# Patient Record
Sex: Female | Born: 1965 | Race: White | Hispanic: No | Smoking: Former smoker
Health system: Southern US, Community
[De-identification: ages and names within clinical notes are randomized; demographics above are authoritative.]

## PROBLEM LIST (undated history)

## (undated) DIAGNOSIS — K760 Fatty (change of) liver, not elsewhere classified: Secondary | ICD-10-CM

## (undated) DIAGNOSIS — M5020 Other cervical disc displacement, unspecified cervical region: Secondary | ICD-10-CM

## (undated) DIAGNOSIS — L409 Psoriasis, unspecified: Secondary | ICD-10-CM

## (undated) DIAGNOSIS — R000 Tachycardia, unspecified: Secondary | ICD-10-CM

## (undated) DIAGNOSIS — I10 Essential (primary) hypertension: Secondary | ICD-10-CM

## (undated) DIAGNOSIS — E119 Type 2 diabetes mellitus without complications: Secondary | ICD-10-CM

## (undated) HISTORY — DX: Type 2 diabetes mellitus without complications: E11.9

## (undated) HISTORY — DX: Essential (primary) hypertension: I10

---

## 2006-03-28 ENCOUNTER — Other Ambulatory Visit: Payer: Self-pay

## 2006-03-28 ENCOUNTER — Emergency Department: Payer: Self-pay | Admitting: Emergency Medicine

## 2007-03-23 DIAGNOSIS — F5105 Insomnia due to other mental disorder: Secondary | ICD-10-CM | POA: Insufficient documentation

## 2009-11-28 ENCOUNTER — Ambulatory Visit: Payer: Self-pay

## 2010-07-01 ENCOUNTER — Ambulatory Visit: Payer: Self-pay | Admitting: Otolaryngology

## 2011-02-25 ENCOUNTER — Ambulatory Visit: Payer: Self-pay

## 2012-03-09 ENCOUNTER — Ambulatory Visit: Payer: Self-pay | Admitting: Family Medicine

## 2013-01-16 ENCOUNTER — Ambulatory Visit: Payer: Self-pay | Admitting: Physician Assistant

## 2013-02-14 ENCOUNTER — Ambulatory Visit: Payer: Self-pay | Admitting: Physician Assistant

## 2013-03-15 ENCOUNTER — Ambulatory Visit: Payer: Self-pay

## 2013-04-05 LAB — HM COLONOSCOPY

## 2013-04-18 ENCOUNTER — Ambulatory Visit: Payer: Self-pay | Admitting: Family Medicine

## 2014-03-22 ENCOUNTER — Ambulatory Visit: Payer: Self-pay | Admitting: Family Medicine

## 2014-08-16 HISTORY — PX: INTRAUTERINE DEVICE INSERTION: SHX323

## 2014-11-28 DIAGNOSIS — I10 Essential (primary) hypertension: Secondary | ICD-10-CM | POA: Insufficient documentation

## 2014-11-28 DIAGNOSIS — I471 Supraventricular tachycardia: Secondary | ICD-10-CM | POA: Insufficient documentation

## 2014-11-28 DIAGNOSIS — E119 Type 2 diabetes mellitus without complications: Secondary | ICD-10-CM | POA: Insufficient documentation

## 2014-11-28 DIAGNOSIS — I350 Nonrheumatic aortic (valve) stenosis: Secondary | ICD-10-CM | POA: Insufficient documentation

## 2014-11-28 DIAGNOSIS — E782 Mixed hyperlipidemia: Secondary | ICD-10-CM | POA: Insufficient documentation

## 2015-03-21 ENCOUNTER — Other Ambulatory Visit: Payer: Self-pay

## 2015-03-21 DIAGNOSIS — Z1231 Encounter for screening mammogram for malignant neoplasm of breast: Secondary | ICD-10-CM

## 2015-04-05 ENCOUNTER — Ambulatory Visit
Admission: RE | Admit: 2015-04-05 | Discharge: 2015-04-05 | Disposition: A | Discharge status: Home / Self Care | Payer: Managed Care, Other (non HMO) | Source: Ambulatory Visit | Attending: Family Medicine | Admitting: Family Medicine

## 2015-04-05 DIAGNOSIS — Z1231 Encounter for screening mammogram for malignant neoplasm of breast: Secondary | ICD-10-CM

## 2015-04-05 DIAGNOSIS — R921 Mammographic calcification found on diagnostic imaging of breast: Secondary | ICD-10-CM | POA: Insufficient documentation

## 2015-04-09 ENCOUNTER — Other Ambulatory Visit: Payer: Self-pay | Admitting: Family Medicine

## 2015-04-09 DIAGNOSIS — R928 Other abnormal and inconclusive findings on diagnostic imaging of breast: Secondary | ICD-10-CM

## 2015-04-09 DIAGNOSIS — R921 Mammographic calcification found on diagnostic imaging of breast: Secondary | ICD-10-CM

## 2015-04-10 ENCOUNTER — Ambulatory Visit
Admission: RE | Admit: 2015-04-10 | Discharge: 2015-04-10 | Disposition: A | Discharge status: Home / Self Care | Payer: Managed Care, Other (non HMO) | Source: Ambulatory Visit | Attending: Family Medicine | Admitting: Family Medicine

## 2015-04-10 ENCOUNTER — Ambulatory Visit: Payer: Managed Care, Other (non HMO)

## 2015-04-10 DIAGNOSIS — R928 Other abnormal and inconclusive findings on diagnostic imaging of breast: Secondary | ICD-10-CM

## 2015-04-10 DIAGNOSIS — R921 Mammographic calcification found on diagnostic imaging of breast: Secondary | ICD-10-CM | POA: Diagnosis not present

## 2015-04-12 ENCOUNTER — Other Ambulatory Visit: Payer: Self-pay | Admitting: Family Medicine

## 2015-04-12 DIAGNOSIS — R921 Mammographic calcification found on diagnostic imaging of breast: Secondary | ICD-10-CM

## 2015-04-18 ENCOUNTER — Ambulatory Visit
Admission: RE | Admit: 2015-04-18 | Discharge: 2015-04-18 | Disposition: A | Discharge status: Home / Self Care | Payer: Managed Care, Other (non HMO) | Source: Ambulatory Visit | Attending: Family Medicine | Admitting: Family Medicine

## 2015-04-18 ENCOUNTER — Other Ambulatory Visit: Payer: Self-pay | Admitting: Family Medicine

## 2015-04-18 DIAGNOSIS — R921 Mammographic calcification found on diagnostic imaging of breast: Secondary | ICD-10-CM | POA: Diagnosis present

## 2015-04-18 DIAGNOSIS — R920 Mammographic microcalcification found on diagnostic imaging of breast: Secondary | ICD-10-CM | POA: Diagnosis not present

## 2015-04-18 HISTORY — PX: BREAST BIOPSY: SHX20

## 2015-04-19 LAB — SURGICAL PATHOLOGY

## 2015-05-13 ENCOUNTER — Telehealth: Payer: Self-pay | Admitting: Family Medicine

## 2015-05-13 NOTE — Telephone Encounter (Signed)
Patient advised per [employer] medical records her last Tdap was 01/12/2011. Patient verbalized understanding.

## 2015-05-13 NOTE — Telephone Encounter (Signed)
Pt would like to know when she had her last Tetanus shot. Thanks TNP

## 2015-05-21 ENCOUNTER — Other Ambulatory Visit: Payer: Self-pay

## 2015-05-21 ENCOUNTER — Encounter: Payer: Self-pay | Admitting: Family Medicine

## 2015-05-31 ENCOUNTER — Ambulatory Visit (INDEPENDENT_AMBULATORY_CARE_PROVIDER_SITE_OTHER): Payer: Managed Care, Other (non HMO) | Admitting: Family Medicine

## 2015-05-31 ENCOUNTER — Other Ambulatory Visit: Payer: Self-pay

## 2015-05-31 ENCOUNTER — Encounter: Payer: Self-pay | Admitting: Family Medicine

## 2015-05-31 VITALS — BP 136/86 | HR 89 | Temp 98.2°F | Resp 16 | Ht 61.0 in | Wt 231.4 lb

## 2015-05-31 DIAGNOSIS — Z0000 Encounter for general adult medical examination without abnormal findings: Secondary | ICD-10-CM

## 2015-05-31 DIAGNOSIS — E119 Type 2 diabetes mellitus without complications: Secondary | ICD-10-CM

## 2015-05-31 DIAGNOSIS — I471 Supraventricular tachycardia: Secondary | ICD-10-CM

## 2015-05-31 LAB — POCT URINALYSIS DIPSTICK
Bilirubin, UA: NEGATIVE
Blood, UA: NEGATIVE
GLUCOSE UA: NEGATIVE
Ketones, UA: NEGATIVE
LEUKOCYTES UA: NEGATIVE
Nitrite, UA: NEGATIVE
PH UA: 6
Protein, UA: NEGATIVE
Spec Grav, UA: 1.025
Urobilinogen, UA: 0.2

## 2015-05-31 NOTE — Progress Notes (Signed)
Patient: Kelly Matthews, Female    DOB: Jan 13, 1966, 49 y.o.   MRN: [id number] Visit Date: 06/02/2015  Today's Provider: [name], PA   Chief Complaint  Patient presents with  . Annual Exam   Subjective:    Annual physical exam Kelly Matthews is a 49 y.o. female who presents today for health maintenance and complete physical. She feels fairly well. She reports exercising by walking a lot at work 8 hours a day. She reports she is sleeping well. Had annual diabetes eye exam Feb. 2016 and reports no retinopathy.  Review of Systems  Constitutional: Negative.   HENT: Negative.   Eyes: Negative.   Respiratory: Negative.   Cardiovascular: Negative.   Gastrointestinal: Positive for diarrhea and anal bleeding. Negative for vomiting.       Loose stools once a day 3-4 times a week suspected due to Metformin. Other 3 days a week solid/normal stools. Some blood on tissue with large caliber stool occasional.  Endocrine: Positive for heat intolerance.       Some hot flash and sweats couple times a week for 1 hour.  Genitourinary: Negative.   Musculoskeletal: Negative.   Skin: Negative.   Neurological: Negative.   Hematological: Negative.   Psychiatric/Behavioral: Negative.     Social History She  reports that she has quit smoking. She does not have any smokeless tobacco history on file. She reports that she drinks alcohol. She reports that she does not use illicit drugs. Widowed January 2016 unexpectedly.   Patient Active Problem List   Diagnosis Date Noted  . Diabetes 11/28/2014  . Essential (primary) hypertension 11/28/2014  . Aortic heart valve narrowing 11/28/2014  . 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 Supraventricular tachycardia 11/28/2014    Past Surgical History  Procedure Laterality Date  . Cesarean section  [id number]  . Breast biopsy Left 04/18/2015    Benign calcification  . Intrauterine device insertion   October 2015    [facility]    Family History Her family history includes Cancer in her maternal grandmother; Coronary artery disease in her father and mother; Diabetes in her father; Emphysema in her father; Seizures in her daughter.    No Known Allergies  Previous Medications   BUDESONIDE (PULMICORT FLEXHALER) 180 MCG/ACT INHALER       DILTIAZEM (DILACOR XR) 120 MG 24 HR CAPSULE    1 capsule daily.   FLUTICASONE (FLONASE) 50 MCG/ACT NASAL SPRAY    Place 2 sprays into the nose daily.   HYDROCHLOROTHIAZIDE (MICROZIDE) 12.5 MG CAPSULE    1 capsule daily.   LISINOPRIL (PRINIVIL,ZESTRIL) 5 MG TABLET    Take 5 mg by mouth daily.   METFORMIN (GLUCOPHAGE) 500 MG TABLET    Take 500 mg by mouth 2 (two) times daily.   MONTELUKAST (SINGULAIR) 10 MG TABLET    Take 10 mg by mouth daily.   ONE TOUCH ULTRA TEST TEST STRIP    USE 1 STRIP TWICE DAILY AS NEEDED   ONETOUCH DELICA LANCETS 40J MISC    USE TO CHECK BLOOD SUGAR TWICE A DAY    Patient Care Team: Margo Common, PA as PCP - General (Physician Assistant)      Objective:   Vitals: BP 136/86 mmHg  Pulse 89  Temp(Src) 98.2 F (36.8 C) (Oral)  Resp 16  Ht 5\' 1"  (1.549 m)  Wt 231 lb 6.4 oz (104.962 kg)  BMI 43.75 kg/m2  SpO2 99%   Physical Exam  Constitutional: She is oriented to person, place, and time. She appears well-developed and well-nourished.  Obese with BMI 43.75  HENT:  Head: Normocephalic and atraumatic.  Right Ear: External ear normal.  Left Ear: External ear normal.  Nose: Nose normal.  Mouth/Throat: Oropharynx is clear and moist.  Eyes: Conjunctivae and EOM are normal. Pupils are equal, round, and reactive to light.  Neck: Normal range of motion. Neck supple. No thyromegaly present.  Cardiovascular: Normal rate, regular rhythm, normal heart sounds and intact distal pulses.   Pulmonary/Chest: Effort normal and breath sounds normal.  Abdominal: Soft. Bowel sounds are normal. There is no tenderness.    Genitourinary:  Deferred - has IUD and will follow up with GYN at [facility] (Dr. Glennon Mac).  Musculoskeletal: Normal range of motion.  Neurological: She is alert and oriented to person, place, and time. She has normal reflexes.  Skin: Skin is warm and dry.  Psychiatric: She has a normal mood and affect. Her behavior is normal. Thought content normal.  PHQ-2 score 1/2. No overwhelming  Bereavement  - widowed 12-11-14.     Depression Screen   Assessment & Plan:     Routine Health Maintenance and Physical Exam  Exercise Activities and Dietary recommendations Goals    None      Immunization History  Administered Date(s) Administered  . Hepatitis A 08/31/2012, 03/01/2013  . Hepatitis B 08/31/2012, 11/01/2012, 03/01/2013  . Pneumococcal Polysaccharide-23 05/01/2014  . Tdap 01/12/2011    Health Maintenance  Topic Date Due  . HIV Screening  02/11/1982  . PAP SMEAR  02/11/1985  . INFLUENZA VACCINE  06/17/2015  . HEMOGLOBIN A1C  12/01/2015  . OPHTHALMOLOGY EXAM  12/18/2015  . FOOT EXAM  05/30/2016  . URINE MICROALBUMIN  06/01/2016  . PNEUMOCOCCAL POLYSACCHARIDE VACCINE (2) 05/02/2019  . TETANUS/TDAP  01/12/2021      Discussed health benefits of physical activity, and encouraged her to engage in regular exercise appropriate for her age and condition.    --------------------------------------------------------------------   1. Annual physical exam  Good general health with obesity. Mirena IUD put in October 2015 by her GYN (Dr. Glennon Mac at [facility]) and having minimal menstrual flow - but still regular). Had left breast biopsy 04-18-15 with path report showing a benign calcification. Will get follow up mammograms annually. Counseled regarding colonoscopy in 2 years.  - POCT urinalysis dipstick  2. Type 2 diabetes mellitus without complication Stable with FBS normally 150-190 recently. Tolerating Metformin 500 mg BID with occasional loose stools. Encouraged to continue diabetic  diet. Urine micro albumin 100 today. Continue Lisinopril 5 mg qd. May need statin. Will check labs and follow up pending reports. - CBC with Differential/Platelet - Comprehensive metabolic panel - Hemoglobin A1c - Lipid panel  3. Supraventricular tachycardia Well controlled. Limits caffeine and follows with Dr. Nehemiah Massed (cardiologist) for history of Diltiazem prn recurrences. Check TSH with weight gain and heat intolerance episodes. Recheck prn reports. - TSH

## 2015-06-05 ENCOUNTER — Telehealth: Payer: Self-pay | Admitting: Family Medicine

## 2015-06-05 LAB — COMPREHENSIVE METABOLIC PANEL
ALK PHOS: 94 IU/L (ref 39–117)
ALT: 76 IU/L — ABNORMAL HIGH (ref 0–32)
AST: 44 IU/L — AB (ref 0–40)
Albumin/Globulin Ratio: 1.8 (ref 1.1–2.5)
Albumin: 4.5 g/dL (ref 3.5–5.5)
BILIRUBIN TOTAL: 0.3 mg/dL (ref 0.0–1.2)
BUN/Creatinine Ratio: 17 (ref 9–23)
BUN: 10 mg/dL (ref 6–24)
CO2: 22 mmol/L (ref 18–29)
Calcium: 9.6 mg/dL (ref 8.7–10.2)
Chloride: 96 mmol/L — ABNORMAL LOW (ref 97–108)
Creatinine, Ser: 0.58 mg/dL (ref 0.57–1.00)
GFR calc Af Amer: 126 mL/min/{1.73_m2} (ref >59)
GFR calc non Af Amer: 109 mL/min/{1.73_m2} (ref >59)
GLUCOSE: 187 mg/dL — AB (ref 65–99)
Globulin, Total: 2.5 g/dL (ref 1.5–4.5)
Potassium: 4 mmol/L (ref 3.5–5.2)
Sodium: 138 mmol/L (ref 134–144)
Total Protein: 7 g/dL (ref 6.0–8.5)

## 2015-06-05 LAB — CBC WITH DIFFERENTIAL/PLATELET
BASOS: 0 %
Basophils Absolute: 0 10*3/uL (ref 0.0–0.2)
EOS (ABSOLUTE): 0.1 10*3/uL (ref 0.0–0.4)
Eos: 1 %
Hematocrit: 38.7 % (ref 34.0–46.6)
Hemoglobin: 13.1 g/dL (ref 11.1–15.9)
Immature Grans (Abs): 0 10*3/uL (ref 0.0–0.1)
Immature Granulocytes: 0 %
LYMPHS ABS: 2.1 10*3/uL (ref 0.7–3.1)
LYMPHS: 19 %
MCH: 28.1 pg (ref 26.6–33.0)
MCHC: 33.9 g/dL (ref 31.5–35.7)
MCV: 83 fL (ref 79–97)
MONOCYTES: 5 %
Monocytes Absolute: 0.6 10*3/uL (ref 0.1–0.9)
NEUTROS ABS: 8.2 10*3/uL — AB (ref 1.4–7.0)
NEUTROS PCT: 75 %
Platelets: 187 10*3/uL (ref 150–379)
RBC: 4.67 x10E6/uL (ref 3.77–5.28)
RDW: 13.8 % (ref 12.3–15.4)
WBC: 11 10*3/uL — ABNORMAL HIGH (ref 3.4–10.8)

## 2015-06-05 LAB — LIPID PANEL
CHOLESTEROL TOTAL: 179 mg/dL (ref 100–199)
Chol/HDL Ratio: 3.9 ratio units (ref 0.0–4.4)
HDL: 46 mg/dL (ref >39)
LDL Calculated: 118 mg/dL — ABNORMAL HIGH (ref 0–99)
Triglycerides: 73 mg/dL (ref 0–149)
VLDL Cholesterol Cal: 15 mg/dL (ref 5–40)

## 2015-06-05 LAB — HEMOGLOBIN A1C
ESTIMATED AVERAGE GLUCOSE: 160 mg/dL
HEMOGLOBIN A1C: 7.2 % — AB (ref 4.8–5.6)

## 2015-06-05 LAB — TSH: TSH: 3.04 u[IU]/mL (ref 0.450–4.500)

## 2015-06-05 NOTE — Telephone Encounter (Signed)
Pt is request lab results/MW

## 2015-06-07 NOTE — Telephone Encounter (Signed)
See below

## 2015-06-19 ENCOUNTER — Other Ambulatory Visit: Payer: Self-pay | Admitting: Family Medicine

## 2015-06-20 NOTE — Telephone Encounter (Signed)
Last ov was on 05/31/2015

## 2015-07-19 ENCOUNTER — Ambulatory Visit (INDEPENDENT_AMBULATORY_CARE_PROVIDER_SITE_OTHER): Payer: Managed Care, Other (non HMO) | Admitting: Family Medicine

## 2015-07-19 ENCOUNTER — Encounter: Payer: Self-pay | Admitting: Family Medicine

## 2015-07-19 VITALS — BP 140/86 | HR 62 | Temp 97.8°F | Resp 16 | Wt 228.8 lb

## 2015-07-19 DIAGNOSIS — J4520 Mild intermittent asthma, uncomplicated: Secondary | ICD-10-CM

## 2015-07-19 DIAGNOSIS — E7800 Pure hypercholesterolemia, unspecified: Secondary | ICD-10-CM | POA: Insufficient documentation

## 2015-07-19 DIAGNOSIS — J309 Allergic rhinitis, unspecified: Secondary | ICD-10-CM

## 2015-07-19 DIAGNOSIS — I471 Supraventricular tachycardia: Secondary | ICD-10-CM

## 2015-07-19 DIAGNOSIS — R7303 Prediabetes: Secondary | ICD-10-CM | POA: Insufficient documentation

## 2015-07-19 DIAGNOSIS — I10 Essential (primary) hypertension: Secondary | ICD-10-CM

## 2015-07-19 DIAGNOSIS — N920 Excessive and frequent menstruation with regular cycle: Secondary | ICD-10-CM | POA: Insufficient documentation

## 2015-07-19 DIAGNOSIS — K229 Disease of esophagus, unspecified: Secondary | ICD-10-CM | POA: Insufficient documentation

## 2015-07-19 DIAGNOSIS — J45909 Unspecified asthma, uncomplicated: Secondary | ICD-10-CM | POA: Insufficient documentation

## 2015-07-19 DIAGNOSIS — K760 Fatty (change of) liver, not elsewhere classified: Secondary | ICD-10-CM | POA: Diagnosis not present

## 2015-07-19 DIAGNOSIS — R739 Hyperglycemia, unspecified: Secondary | ICD-10-CM | POA: Insufficient documentation

## 2015-07-19 DIAGNOSIS — E119 Type 2 diabetes mellitus without complications: Secondary | ICD-10-CM | POA: Diagnosis not present

## 2015-07-19 NOTE — Progress Notes (Signed)
Patient ID: Kelly Matthews, female   DOB: 02-26-1966, 49 y.o.   MRN: [id number]   Chief Complaint  Patient presents with  . Follow-up    Subjective:  HPI  This 49 year old widow presents for follow up of diabetes and liver enzyme elevations. Has been trying to follow low fat diabetic diet. Has continued Metformin 500 mg BID. Denies polyuria and no change in thirst level. No blurred vision but some nausea occasion without vomiting. Occasionally has loose stools since starting the Metformin. No change in weight. Also, has had some scratchy sore throat with pressure around nose the past couple days. Some sneezing yesterday evening. No fever or cough. Still taking Flonase, Singulair and Pulmicort for allergies and asthma. Diagnosed with NASH with ALT 166, AST 66 and ultrasound showing fatty liver in 2013. Was having some nausea at that time and was given Zofran for a short time. Has not had any jaundice and no follow up with GI recently.  No Known Allergies  Prior to Admission medications   Medication Sig Start Date End Date Taking? Authorizing Provider  budesonide (PULMICORT FLEXHALER) 180 MCG/ACT inhaler  09/25/14  Yes Historical Provider, MD  diltiazem (DILACOR XR) 120 MG 24 hr capsule 1 capsule daily. 12/03/14  Yes Historical Provider, MD  fluticasone (FLONASE) 50 MCG/ACT nasal spray SPRAY 2 SPRAYS IN EACH NOSTRIL DAILY 06/20/15  Yes Dennis E Chrismon, PA  hydrochlorothiazide (MICROZIDE) 12.5 MG capsule 1 capsule daily. 11/12/14  Yes Historical Provider, MD  lisinopril (PRINIVIL,ZESTRIL) 5 MG tablet Take 5 mg by mouth daily. 05/24/15  Yes Historical Provider, MD  metFORMIN (GLUCOPHAGE) 500 MG tablet Take 500 mg by mouth 2 (two) times daily. 04/28/15  Yes Historical Provider, MD  montelukast (SINGULAIR) 10 MG tablet Take 10 mg by mouth daily. 04/01/15  Yes Historical Provider, MD  ONE TOUCH ULTRA TEST test strip USE 1 STRIP TWICE DAILY AS NEEDED 04/28/15  Yes Historical Provider, MD  Glory Rosebush  DELICA LANCETS 83T MISC USE TO CHECK BLOOD SUGAR TWICE A DAY 04/28/15  Yes Historical Provider, MD    Patient Active Problem List   Diagnosis Date Noted  . Excess, menstruation 07/19/2015  . 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 Blood glucose elevated 07/19/2015  . Airway hyperreactivity 07/19/2015  . Disorder of esophagus 07/19/2015  . Hypercholesterolemia without hypertriglyceridemia 07/19/2015  . Borderline diabetes 07/19/2015  . Diabetes 11/28/2014  . Essential (primary) hypertension 11/28/2014  . Aortic heart valve narrowing 11/28/2014  . 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 Supraventricular tachycardia 11/28/2014  . 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 No pertinent past medical history.  Social History   Social History  . Marital Status: Single    Spouse Name: N/A  . Number of Children: N/A  . Years of Education: N/A   Occupational History  . Not on file.   Social History Main Topics  . Smoking status: Former Smoker -- 1 years  . Smokeless tobacco: Not on file  . Alcohol Use: 0.0 oz/week    0 Standard drinks or equivalent per week     Comment: occasionally  . Drug Use: No  . Sexual Activity: Not on file   Other Topics Concern  . Not on file   Social History Narrative    No Known Allergies  Review of Systems  Constitutional: Negative.   HENT: Negative.   Eyes: Negative.   Respiratory: Negative.   Cardiovascular: Negative.   Gastrointestinal: Positive for nausea.  Genitourinary: Negative.   Musculoskeletal: Negative.   Skin: Negative.   Neurological: Negative.   Endo/Heme/Allergies: Negative.   Psychiatric/Behavioral: Negative.     Immunization History  Administered Date(s) Administered  . Hepatitis A 08/31/2012, 03/01/2013  . Hepatitis B 08/31/2012, 11/01/2012, 03/01/2013  . Pneumococcal Polysaccharide-23 05/01/2014  . Tdap 01/12/2011   Objective:  BP 140/86 mmHg  Pulse 62   Temp(Src) 97.8 F (36.6 C) (Oral)  Resp 16  Wt 228 lb 12.8 oz (103.783 kg)  Physical Exam  Constitutional: She is oriented to person, place, and time and well-developed, well-nourished, and in no distress.  HENT:  Head: Normocephalic and atraumatic.  Nose: Nose normal.  Mouth/Throat: Oropharynx is clear and moist.  Slight cobblestoning of posterior pharynx  Eyes: EOM are normal.  Neck: Normal range of motion.  Cardiovascular: Normal rate, regular rhythm and normal heart sounds.   Pulmonary/Chest: Effort normal and breath sounds normal.  Abdominal: Soft. Bowel sounds are normal. She exhibits no mass. There is no tenderness.  Musculoskeletal: Normal range of motion.  Neurological: She is alert and oriented to person, place, and time.  Skin: No rash noted.  Psychiatric: Affect and judgment normal.    Lab Results  Component Value Date   WBC 11.0* 06/04/2015   HCT 38.7 06/04/2015   GLUCOSE 187* 06/04/2015   CHOL 179 06/04/2015   TRIG 73 06/04/2015   HDL 46 06/04/2015   LDLCALC 118* 06/04/2015   TSH 3.040 06/04/2015   HGBA1C 7.2* 06/04/2015    CMP     Component Value Date/Time   NA 138 06/04/2015 0813   K 4.0 06/04/2015 0813   CL 96* 06/04/2015 0813   CO2 22 06/04/2015 0813   GLUCOSE 187* 06/04/2015 0813   BUN 10 06/04/2015 0813   CREATININE 0.58 06/04/2015 0813   CALCIUM 9.6 06/04/2015 0813   PROT 7.0 06/04/2015 0813   AST 44* 06/04/2015 0813   ALT 76* 06/04/2015 0813   ALKPHOS 94 06/04/2015 0813   BILITOT 0.3 06/04/2015 0813   GFRNONAA 109 06/04/2015 0813   GFRAA 126 06/04/2015 0813    Assessment and Plan :  1. Type 2 diabetes mellitus without complication Weight still up with little change in BMI. Still taking Metformin 500 mg BID. Occasional nausea and loose stool. May need change to DPP4 or SGLT. Hgb A1C was 7.2 in July 2016. Recheck CMP. - COMPLETE METABOLIC PANEL WITH GFR  2. Hypercholesterolemia without hypertriglyceridemia Has been trying to watch diet  closer. Recheck lipid panel to see if statin needed. - Lipid panel  3. Fatty metamorphosis of liver Fatty liver shown on abdominal ultrasound in 2013. GI referral indicated NASH with ALT 166 and AST 66. Will recheck progress and consider follow up with GI for repeat ultrasound since she has had some unexplained nausea symptoms recently. - COMPLETE METABOLIC PANEL WITH GFR  4. Airway hyperreactivity, mild intermittent, uncomplicated Stable and controlled with Singulair and Pulmicort. Recheck prn.  5. Essential (primary) hypertension BP borderline today. Continues Lisinopril, HCTZ and follow up with cardiologist. May need Diltiazem daily to control SVT and help BP control.  - CBC with Differential/Platelet  6. Supraventricular tachycardia Stable and controlled by use of Diltiazem as recommended by cardiologist. Will continue follow up with Dr. Nehemiah Massed.  7. Allergic rhinitis, unspecified allergic rhinitis type Recent scratchy throat and nasal congestion. Continue flonase and recommend loratadine. Recheck prn.   Miguel Aschoff MD [facility] 07/19/2015 8:16 AM

## 2015-07-20 LAB — CBC WITH DIFFERENTIAL/PLATELET
BASOS: 0 %
Basophils Absolute: 0 10*3/uL (ref 0.0–0.2)
EOS (ABSOLUTE): 0 10*3/uL (ref 0.0–0.4)
Eos: 0 %
Hematocrit: 40.6 % (ref 34.0–46.6)
Hemoglobin: 13.6 g/dL (ref 11.1–15.9)
IMMATURE GRANS (ABS): 0 10*3/uL (ref 0.0–0.1)
IMMATURE GRANULOCYTES: 0 %
Lymphocytes Absolute: 2.2 10*3/uL (ref 0.7–3.1)
Lymphs: 20 %
MCH: 28.2 pg (ref 26.6–33.0)
MCHC: 33.5 g/dL (ref 31.5–35.7)
MCV: 84 fL (ref 79–97)
MONOCYTES: 3 %
Monocytes Absolute: 0.4 10*3/uL (ref 0.1–0.9)
NEUTROS ABS: 8.4 10*3/uL — AB (ref 1.4–7.0)
Neutrophils: 77 %
Platelets: 159 10*3/uL (ref 150–379)
RBC: 4.82 x10E6/uL (ref 3.77–5.28)
RDW: 13.5 % (ref 12.3–15.4)
WBC: 11.1 10*3/uL — ABNORMAL HIGH (ref 3.4–10.8)

## 2015-07-20 LAB — COMPREHENSIVE METABOLIC PANEL
ALBUMIN: 4.6 g/dL (ref 3.5–5.5)
ALK PHOS: 110 IU/L (ref 39–117)
ALT: 89 IU/L — ABNORMAL HIGH (ref 0–32)
AST: 45 IU/L — ABNORMAL HIGH (ref 0–40)
Albumin/Globulin Ratio: 1.7 (ref 1.1–2.5)
BUN / CREAT RATIO: 26 — AB (ref 9–23)
BUN: 15 mg/dL (ref 6–24)
Bilirubin Total: 0.4 mg/dL (ref 0.0–1.2)
CHLORIDE: 96 mmol/L — AB (ref 97–108)
CO2: 22 mmol/L (ref 18–29)
Calcium: 9.6 mg/dL (ref 8.7–10.2)
Creatinine, Ser: 0.58 mg/dL (ref 0.57–1.00)
GFR calc Af Amer: 126 mL/min/{1.73_m2} (ref >59)
GFR calc non Af Amer: 109 mL/min/{1.73_m2} (ref >59)
GLOBULIN, TOTAL: 2.7 g/dL (ref 1.5–4.5)
GLUCOSE: 215 mg/dL — AB (ref 65–99)
Potassium: 4.2 mmol/L (ref 3.5–5.2)
SODIUM: 137 mmol/L (ref 134–144)
Total Protein: 7.3 g/dL (ref 6.0–8.5)

## 2015-07-20 LAB — LIPID PANEL
CHOLESTEROL TOTAL: 185 mg/dL (ref 100–199)
Chol/HDL Ratio: 4.2 ratio units (ref 0.0–4.4)
HDL: 44 mg/dL (ref >39)
LDL CALC: 127 mg/dL — AB (ref 0–99)
Triglycerides: 69 mg/dL (ref 0–149)
VLDL CHOLESTEROL CAL: 14 mg/dL (ref 5–40)

## 2015-07-22 ENCOUNTER — Other Ambulatory Visit: Payer: Self-pay | Admitting: Family Medicine

## 2015-07-22 DIAGNOSIS — J309 Allergic rhinitis, unspecified: Secondary | ICD-10-CM

## 2015-07-23 ENCOUNTER — Telehealth: Payer: Self-pay

## 2015-07-23 DIAGNOSIS — J309 Allergic rhinitis, unspecified: Secondary | ICD-10-CM | POA: Insufficient documentation

## 2015-07-23 DIAGNOSIS — R748 Abnormal levels of other serum enzymes: Secondary | ICD-10-CM

## 2015-07-23 NOTE — Telephone Encounter (Signed)
Patient advised as directed below. Per patient needs a referral to a gastroenterologist because the one the patient used to see is not practicing. Patient also wants a glucose meter, per patient got one from [facility] but it broke. Also how many sampls of Invokana do  I prepare for the patient to come and pick up> Please advise.  Thanks,  -Yosselin Zoeller

## 2015-07-23 NOTE — Telephone Encounter (Signed)
-----   Message from Margo Common, [place] sent at 07/22/2015  4:00 PM EDT ----- Slight increase in ALT but AST stable. Should consider follow up with gastroenterologist if nausea continues. Blood sugar higher and LDL cholesterol slightly higher than a month ago. Recommend lowering Metformin to 500 mg qd and adding Invokana (have some samples to give it a trial). Continue to check FBS daily and work on weight loss. Recheck progress in 4 weeks (this change could lower BP a little more).

## 2015-07-23 NOTE — Telephone Encounter (Signed)
Schedule gastroenterology referral for elevated liver enzymes and history of NASH. May give her a OneTouch Mini or Verio glucometer we have here to check FBS each morning. Give 6 bottles (30 tablets) of the Invokana 100 mg to take one tablet daily and plan recheck appointment in 4 weeks.

## 2015-07-24 ENCOUNTER — Other Ambulatory Visit: Payer: Self-pay | Admitting: Family Medicine

## 2015-07-24 DIAGNOSIS — J309 Allergic rhinitis, unspecified: Secondary | ICD-10-CM

## 2015-07-24 NOTE — Telephone Encounter (Signed)
Patient aware of the referral and already came to pick up the Invokana samples and the One Touch Mini and scheduled the 4 weeks follow up.  Thanks,  -[name]

## 2015-07-26 ENCOUNTER — Other Ambulatory Visit: Payer: Self-pay

## 2015-07-26 ENCOUNTER — Telehealth: Payer: Self-pay

## 2015-07-26 DIAGNOSIS — J309 Allergic rhinitis, unspecified: Secondary | ICD-10-CM

## 2015-07-26 DIAGNOSIS — J4520 Mild intermittent asthma, uncomplicated: Secondary | ICD-10-CM

## 2015-07-26 DIAGNOSIS — E119 Type 2 diabetes mellitus without complications: Secondary | ICD-10-CM

## 2015-07-26 DIAGNOSIS — I10 Essential (primary) hypertension: Secondary | ICD-10-CM

## 2015-07-26 MED ORDER — FLUTICASONE PROPIONATE 50 MCG/ACT NA SUSP: NASAL | Status: DC

## 2015-07-26 MED ORDER — LISINOPRIL 5 MG PO TABS: 5.0000 mg | ORAL_TABLET | Freq: Every day | ORAL | Status: DC

## 2015-07-26 MED ORDER — METFORMIN HCL 500 MG PO TABS: 500.0000 mg | ORAL_TABLET | Freq: Two times a day (BID) | ORAL | Status: DC

## 2015-07-26 MED ORDER — MONTELUKAST SODIUM 10 MG PO TABS: 10.0000 mg | ORAL_TABLET | Freq: Every day | ORAL | Status: DC

## 2015-07-26 NOTE — Telephone Encounter (Signed)
Last ov was on 07/19/2015.  Thanks,

## 2015-07-26 NOTE — Telephone Encounter (Signed)
Request received from [facility] pharmacy requesting a 90 day supply on Fluticasone Prop 50 mcg spray Metformin HCL 500 mg, Lisinopril 5 mg, and Montelukast 10 mg.

## 2015-07-29 ENCOUNTER — Other Ambulatory Visit: Payer: Self-pay

## 2015-07-29 DIAGNOSIS — I10 Essential (primary) hypertension: Secondary | ICD-10-CM

## 2015-07-29 MED ORDER — LISINOPRIL 5 MG PO TABS: 5.0000 mg | ORAL_TABLET | Freq: Every day | ORAL | Status: DC

## 2015-07-30 NOTE — Telephone Encounter (Signed)
Request faxed back to [facility] pharmacy with approval for 90 day supply per [facility].

## 2015-08-06 ENCOUNTER — Encounter: Payer: Self-pay | Admitting: Family Medicine

## 2015-08-06 ENCOUNTER — Ambulatory Visit (INDEPENDENT_AMBULATORY_CARE_PROVIDER_SITE_OTHER): Payer: Managed Care, Other (non HMO) | Admitting: Family Medicine

## 2015-08-06 VITALS — BP 122/84 | HR 92 | Temp 98.5°F | Resp 18 | Wt 226.8 lb

## 2015-08-06 DIAGNOSIS — E119 Type 2 diabetes mellitus without complications: Secondary | ICD-10-CM | POA: Diagnosis not present

## 2015-08-06 DIAGNOSIS — M549 Dorsalgia, unspecified: Secondary | ICD-10-CM

## 2015-08-06 DIAGNOSIS — M546 Pain in thoracic spine: Secondary | ICD-10-CM

## 2015-08-06 LAB — POCT GLYCOSYLATED HEMOGLOBIN (HGB A1C): HEMOGLOBIN A1C: 8.2

## 2015-08-06 MED ORDER — METHOCARBAMOL 500 MG PO TABS: 500.0000 mg | ORAL_TABLET | Freq: Four times a day (QID) | ORAL | Status: DC

## 2015-08-06 NOTE — Patient Instructions (Signed)
Back Pain, Adult °Back pain is very common. The pain often gets better over time. The cause of back pain is usually not dangerous. Most people can learn to manage their back pain on their own.  °HOME CARE  °· Stay active. Start with short walks on flat ground if you can. Try to walk farther each day. °· Do not sit, drive, or stand in one place for more than 30 minutes. Do not stay in bed. °· Do not avoid exercise or work. Activity can help your back heal faster. °· Be careful when you bend or lift an object. Bend at your knees, keep the object close to you, and do not twist. °· Sleep on a firm mattress. Lie on your side, and bend your knees. If you lie on your back, put a pillow under your knees. °· Only take medicines as told by your doctor. °· Put ice on the injured area. °¨ Put ice in a plastic bag. °¨ Place a towel between your skin and the bag. °¨ Leave the ice on for 15-20 minutes, 03-04 times a day for the first 2 to 3 days. After that, you can switch between ice and heat packs. °· Ask your doctor about back exercises or massage. °· Avoid feeling anxious or stressed. Find good ways to deal with stress, such as exercise. °GET HELP RIGHT AWAY IF:  °· Your pain does not go away with rest or medicine. °· Your pain does not go away in 1 week. °· You have new problems. °· You do not feel well. °· The pain spreads into your legs. °· You cannot control when you poop (bowel movement) or pee (urinate). °· Your arms or legs feel weak or lose feeling (numbness). °· You feel sick to your stomach (nauseous) or throw up (vomit). °· You have belly (abdominal) pain. °· You feel like you may pass out (faint). °MAKE SURE YOU:  °· Understand these instructions. °· Will watch your condition. °· Will get help right away if you are not doing well or get worse. °Document Released: 04/20/2008 Document Revised: 01/25/2012 Document Reviewed: 03/06/2014 °[employer]® Patient Information ©2015 [employer]. This information is not intended  to replace advice given to you by your health care [name]. Make sure you discuss any questions you have with your health care [name]. ° °

## 2015-08-06 NOTE — Progress Notes (Signed)
Patient ID: Kelly Matthews, female   DOB: [birth year], 49 y.o.   MRN: [id number] Name: Kelly Matthews   MRN: [id number]    DOB: 1966-04-18   Date:08/06/2015       Progress Note  Subjective  Chief Complaint  Chief Complaint  Patient presents with  . Back Pain    right mid back pain X 10 days   Back Pain This is a new problem. The current episode started 1 to 4 weeks ago. The problem occurs constantly. The problem has been gradually worsening since onset.   Patient Active Problem List   Diagnosis Date Noted  . Allergic rhinitis 07/23/2015  . Excess, menstruation 07/19/2015  . 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 Blood glucose elevated 07/19/2015  . Airway hyperreactivity 07/19/2015  . Disorder of esophagus 07/19/2015  . Hypercholesterolemia without hypertriglyceridemia 07/19/2015  . Borderline diabetes 07/19/2015  . Diabetes 11/28/2014  . Essential (primary) hypertension 11/28/2014  . Aortic heart valve narrowing 11/28/2014  . 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 Supraventricular tachycardia 11/28/2014  . 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 Smoking status: Former Smoker -- 1 years  . Smokeless tobacco: Not on file  . Alcohol Use: 0.0 oz/week    0 Standard drinks or equivalent per week     Comment: occasionally    Current outpatient prescriptions:  .  budesonide (PULMICORT FLEXHALER) 180 MCG/ACT inhaler, , Disp: , Rfl:  .  canagliflozin (INVOKANA) 100 MG TABS tablet, Take 100 mg by mouth daily., Disp: , Rfl:  .  diltiazem (DILACOR XR) 120 MG 24 hr capsule, 1 capsule daily., Disp: , Rfl:  .  fluticasone (FLONASE) 50 MCG/ACT nasal spray, SPRAY 2 SPRAYS IN EACH NOSTRIL DAILY, Disp: 16 g, Rfl: 5 .  hydrochlorothiazide (MICROZIDE) 12.5 MG capsule, 1 capsule daily., Disp: , Rfl:  .  lisinopril (PRINIVIL,ZESTRIL) 5 MG tablet, Take 1 tablet (5 mg total) by mouth daily., Disp: 90  tablet, Rfl: 3 .  metFORMIN (GLUCOPHAGE) 500 MG tablet, Take 1 tablet (500 mg total) by mouth 2 (two) times daily., Disp: 60 tablet, Rfl: 3 .  montelukast (SINGULAIR) 10 MG tablet, Take 1 tablet (10 mg total) by mouth daily., Disp: 30 tablet, Rfl: 5 .  ONE TOUCH ULTRA TEST test strip, USE 1 STRIP TWICE DAILY AS NEEDED, Disp: , Rfl: 12 .  ONETOUCH DELICA LANCETS 59D MISC, USE TO CHECK BLOOD SUGAR TWICE A DAY, Disp: , Rfl: 12  No Known Allergies  Review of Systems  Constitutional: Negative.   HENT: Negative.   Eyes: Negative.   Respiratory: Negative.   Cardiovascular: Negative.   Gastrointestinal: Negative.   Genitourinary: Negative.   Musculoskeletal: Positive for back pain.  Skin: Negative.   Neurological: Negative.   Endo/Heme/Allergies: Negative.   Psychiatric/Behavioral: Negative.    Objective  Filed Vitals:   08/06/15 0936  BP: 122/84  Pulse: 92  Temp: 98.5 F (36.9 C)  TempSrc: Oral  Resp: 18  Weight: 226 lb 12.8 oz (102.876 kg)  SpO2: 97%   Physical Exam  Constitutional: She is oriented to person, place, and time and well-developed, well-nourished, and in no distress.  HENT:  Head: Normocephalic and atraumatic.  Eyes: Conjunctivae are normal.  Cardiovascular: Normal rate and regular rhythm.   Pulmonary/Chest: Effort normal and breath sounds normal.  Abdominal: Soft. Bowel sounds are normal.  Musculoskeletal: Normal range of motion. She exhibits tenderness.  Tender over medial border muscles  of the right scapula. Increased discomfort to elevated right humerus against resistance. DTR's symmetric with good grip strength.  Neurological: She is alert and oriented to person, place, and time. She has normal reflexes.  Skin: No rash noted.  Psychiatric: Affect normal.   Recent Results (from the past 2160 hour(s))  POCT urinalysis dipstick     Status: None   Collection Time: 05/31/15  9:19 AM  Result Value Ref Range   Color, UA yellow    Clarity, UA clear     Glucose, UA neg    Bilirubin, UA neg    Ketones, UA neg    Spec Grav, UA 1.025    Blood, UA neg    pH, UA 6.0    Protein, UA neg    Urobilinogen, UA 0.2    Nitrite, UA neg    Leukocytes, UA Negative Negative  CBC with Differential/Platelet     Status: Abnormal   Collection Time: 06/04/15  8:13 AM  Result Value Ref Range   WBC 11.0 (H) 3.4 - 10.8 x10E3/uL   RBC 4.67 3.77 - 5.28 x10E6/uL   Hemoglobin 13.1 11.1 - 15.9 g/dL   Hematocrit 38.7 34.0 - 46.6 %   MCV 83 79 - 97 fL   MCH 28.1 26.6 - 33.0 pg   MCHC 33.9 31.5 - 35.7 g/dL   RDW 13.8 12.3 - 15.4 %   Platelets 187 150 - 379 x10E3/uL   Neutrophils 75 %   Lymphs 19 %   Monocytes 5 %   Eos 1 %   Basos 0 %   Neutrophils Absolute 8.2 (H) 1.4 - 7.0 x10E3/uL   Lymphocytes Absolute 2.1 0.7 - 3.1 x10E3/uL   Monocytes Absolute 0.6 0.1 - 0.9 x10E3/uL   EOS (ABSOLUTE) 0.1 0.0 - 0.4 x10E3/uL   Basophils Absolute 0.0 0.0 - 0.2 x10E3/uL   Immature Granulocytes 0 %   Immature Grans (Abs) 0.0 0.0 - 0.1 x10E3/uL  Comprehensive metabolic panel     Status: Abnormal   Collection Time: 06/04/15  8:13 AM  Result Value Ref Range   Glucose 187 (H) 65 - 99 mg/dL   BUN 10 6 - 24 mg/dL   Creatinine, Ser 0.58 0.57 - 1.00 mg/dL   GFR calc non Af Amer 109 >59 mL/min/1.73   GFR calc Af Amer 126 >59 mL/min/1.73   BUN/Creatinine Ratio 17 9 - 23   Sodium 138 134 - 144 mmol/L   Potassium 4.0 3.5 - 5.2 mmol/L   Chloride 96 (L) 97 - 108 mmol/L   CO2 22 18 - 29 mmol/L   Calcium 9.6 8.7 - 10.2 mg/dL   Total Protein 7.0 6.0 - 8.5 g/dL   Albumin 4.5 3.5 - 5.5 g/dL   Globulin, Total 2.5 1.5 - 4.5 g/dL   Albumin/Globulin Ratio 1.8 1.1 - 2.5   Bilirubin Total 0.3 0.0 - 1.2 mg/dL   Alkaline Phosphatase 94 39 - 117 IU/L   AST 44 (H) 0 - 40 IU/L   ALT 76 (H) 0 - 32 IU/L  Hemoglobin A1c     Status: Abnormal   Collection Time: 06/04/15  8:13 AM  Result Value Ref Range   Hgb A1c MFr Bld 7.2 (H) 4.8 - 5.6 %    Comment:          Pre-diabetes: 5.7 - 6.4           Diabetes: >6.4          Glycemic control for adults with diabetes: <7.0  Est. average glucose Bld gHb Est-mCnc 160 mg/dL  Lipid panel     Status: Abnormal   Collection Time: 06/04/15  8:13 AM  Result Value Ref Range   Cholesterol, Total 179 100 - 199 mg/dL   Triglycerides 73 0 - 149 mg/dL   HDL 46 >39 mg/dL    Comment: According to ATP-III Guidelines, HDL-C >59 mg/dL is considered a negative risk factor for CHD.    VLDL Cholesterol Cal 15 5 - 40 mg/dL   LDL Calculated 118 (H) 0 - 99 mg/dL   Chol/HDL Ratio 3.9 0.0 - 4.4 ratio units    Comment:                                   T. Chol/HDL Ratio                                             Men  Women                               1/2 Avg.Risk  3.4    3.3                                   Avg.Risk  5.0    4.4                                2X Avg.Risk  9.6    7.1                                3X Avg.Risk 23.4   11.0   TSH     Status: None   Collection Time: 06/04/15  8:13 AM  Result Value Ref Range   TSH 3.040 0.450 - 4.500 uIU/mL  CBC with Differential/Platelet     Status: Abnormal   Collection Time: 07/19/15  9:33 AM  Result Value Ref Range   WBC 11.1 (H) 3.4 - 10.8 x10E3/uL   RBC 4.82 3.77 - 5.28 x10E6/uL   Hemoglobin 13.6 11.1 - 15.9 g/dL   Hematocrit 40.6 34.0 - 46.6 %   MCV 84 79 - 97 fL   MCH 28.2 26.6 - 33.0 pg   MCHC 33.5 31.5 - 35.7 g/dL   RDW 13.5 12.3 - 15.4 %   Platelets 159 150 - 379 x10E3/uL   Neutrophils 77 %   Lymphs 20 %   Monocytes 3 %   Eos 0 %   Basos 0 %   Neutrophils Absolute 8.4 (H) 1.4 - 7.0 x10E3/uL   Lymphocytes Absolute 2.2 0.7 - 3.1 x10E3/uL   Monocytes Absolute 0.4 0.1 - 0.9 x10E3/uL   EOS (ABSOLUTE) 0.0 0.0 - 0.4 x10E3/uL   Basophils Absolute 0.0 0.0 - 0.2 x10E3/uL   Immature Granulocytes 0 %   Immature Grans (Abs) 0.0 0.0 - 0.1 x10E3/uL  Lipid panel     Status: Abnormal   Collection Time: 07/19/15  9:33 AM  Result Value Ref Range   Cholesterol, Total 185 100 - 199 mg/dL    Triglycerides 69 0 - 149 mg/dL  HDL 44 >39 mg/dL    Comment: According to ATP-III Guidelines, HDL-C >59 mg/dL is considered a negative risk factor for CHD.    VLDL Cholesterol Cal 14 5 - 40 mg/dL   LDL Calculated 127 (H) 0 - 99 mg/dL   Chol/HDL Ratio 4.2 0.0 - 4.4 ratio units    Comment:                                   T. Chol/HDL Ratio                                             Men  Women                               1/2 Avg.Risk  3.4    3.3                                   Avg.Risk  5.0    4.4                                2X Avg.Risk  9.6    7.1                                3X Avg.Risk 23.4   11.0   Comprehensive metabolic panel     Status: Abnormal   Collection Time: 07/19/15  9:33 AM  Result Value Ref Range   Glucose 215 (H) 65 - 99 mg/dL   BUN 15 6 - 24 mg/dL   Creatinine, Ser 0.58 0.57 - 1.00 mg/dL   GFR calc non Af Amer 109 >59 mL/min/1.73   GFR calc Af Amer 126 >59 mL/min/1.73   BUN/Creatinine Ratio 26 (H) 9 - 23   Sodium 137 134 - 144 mmol/L   Potassium 4.2 3.5 - 5.2 mmol/L   Chloride 96 (L) 97 - 108 mmol/L   CO2 22 18 - 29 mmol/L   Calcium 9.6 8.7 - 10.2 mg/dL   Total Protein 7.3 6.0 - 8.5 g/dL   Albumin 4.6 3.5 - 5.5 g/dL   Globulin, Total 2.7 1.5 - 4.5 g/dL   Albumin/Globulin Ratio 1.7 1.1 - 2.5   Bilirubin Total 0.4 0.0 - 1.2 mg/dL   Alkaline Phosphatase 110 39 - 117 IU/L   AST 45 (H) 0 - 40 IU/L   ALT 89 (H) 0 - 32 IU/L   Assessment & Plan  1. Upper back pain on right side Onset over the past 1 1/2 weeks. No known injury and no rash. Denies cough, congestion or fevers. Increased discomfort to use upper arm. Feels better with pressure applied to the right scapula. Recommend moist heat applications, use Aleve BID and add Methocarbamol 500 mg QID. Recheck prn.  2. Type 2 diabetes mellitus without complication  Blood sugars at home have been in the 200 - 300 range and noticing increase in thirst. Still tolerating Invokana 100 mg qd with Metformin 500 mg  qd. Recommend increasing Metformin to 500 mg BID and continue same dose of Invokana. Recheck in  3 months. Hgb A1C 8.2 today (was 7.2 June 04, 2015) - POCT glycosylated hemoglobin (Hb A1C)

## 2015-08-22 ENCOUNTER — Ambulatory Visit: Payer: Managed Care, Other (non HMO) | Admitting: Family Medicine

## 2015-08-28 ENCOUNTER — Encounter: Payer: Self-pay | Admitting: Gastroenterology

## 2015-08-28 ENCOUNTER — Other Ambulatory Visit: Payer: Self-pay

## 2015-08-28 ENCOUNTER — Ambulatory Visit (INDEPENDENT_AMBULATORY_CARE_PROVIDER_SITE_OTHER): Payer: Managed Care, Other (non HMO) | Admitting: Gastroenterology

## 2015-08-28 VITALS — BP 134/70 | HR 95 | Temp 98.0°F | Ht 59.0 in | Wt 223.0 lb

## 2015-08-28 DIAGNOSIS — R748 Abnormal levels of other serum enzymes: Secondary | ICD-10-CM

## 2015-08-28 MED ORDER — ONDANSETRON HCL 4 MG PO TABS: 4.0000 mg | ORAL_TABLET | Freq: Four times a day (QID) | ORAL | Status: DC | PRN

## 2015-08-28 NOTE — Progress Notes (Signed)
Gastroenterology Consultation  Referring Provider:     Margo Common, PA Primary Care Physician:  [name], PA Primary Gastroenterologist:  Dr. Allen Norris     Reason for Consultation:     Abnormal liver enzymes        HPI:   Kelly Matthews is a 49 y.o. y/o female referred for consultation & management of abnormal liver enzymes by Dr. [name], PA.  Patient comes today with a history of abnormal liver enzymes. The patient states she was seen in the past by a gastrologist at [facility]. The patient reports that at that time she was given some antinausea medication because she suffers from intermittent nausea and a few times a month. The patient was also set up for an EGD and colonoscopy at that time but nothing she recalls was done for her liver except for an ultrasound. The patient states that she had fatty liver found on the ultrasound. There is no report of any active stools or bloody stools. She also denies any fevers or chills. There is no abdominal pain associated with her abnormal liver enzymes. The patient also has diabetes and is being treated for that.  Past Medical History  Diagnosis Date  . Diabetes mellitus without complication ([place])   . Hypertension     Past Surgical History  Procedure Laterality Date  . Cesarean section  [id number]  . Breast biopsy Left 04/18/2015    Benign calcification  . Intrauterine device insertion  October 2015    [facility]    Prior to Admission medications   Medication Sig Start Date End Date Taking? Authorizing Provider  budesonide (PULMICORT FLEXHALER) 180 MCG/ACT inhaler  09/25/14  Yes Historical Provider, MD  canagliflozin (INVOKANA) 100 MG TABS tablet Take 100 mg by mouth daily.   Yes Historical Provider, MD  diltiazem (DILACOR XR) 120 MG 24 hr capsule 1 capsule daily. 12/03/14  Yes Historical Provider, MD  fluticasone (FLONASE) 50 MCG/ACT nasal spray SPRAY 2 SPRAYS IN EACH NOSTRIL DAILY 07/26/15  Yes Dennis E  Chrismon, PA  hydrochlorothiazide (MICROZIDE) 12.5 MG capsule 1 capsule daily. 11/12/14  Yes Historical Provider, MD  lisinopril (PRINIVIL,ZESTRIL) 5 MG tablet Take 1 tablet (5 mg total) by mouth daily. 07/29/15  Yes Dennis E Chrismon, PA  metFORMIN (GLUCOPHAGE) 500 MG tablet Take 1 tablet (500 mg total) by mouth 2 (two) times daily. 07/26/15  Yes Dennis E Chrismon, PA  methocarbamol (ROBAXIN) 500 MG tablet Take 1 tablet (500 mg total) by mouth 4 (four) times daily. 08/06/15  Yes Dennis E Chrismon, PA  montelukast (SINGULAIR) 10 MG tablet Take 1 tablet (10 mg total) by mouth daily. 07/26/15  Yes Dennis E Chrismon, PA  ONE TOUCH ULTRA TEST test strip USE 1 STRIP TWICE DAILY AS NEEDED 04/28/15  Yes Historical Provider, MD  ONETOUCH DELICA LANCETS 82N MISC USE TO CHECK BLOOD SUGAR TWICE A DAY 04/28/15  Yes Historical Provider, MD  ondansetron (ZOFRAN) 4 MG tablet Take 1 tablet (4 mg total) by mouth every 6 (six) hours as needed for nausea or vomiting. 08/28/15   Lucilla Lame, MD    Family History  Problem Relation Age of Onset  . Coronary artery disease Mother   . Coronary artery disease Father   . Diabetes Father   . Emphysema Father   . Seizures Daughter   . Cancer Maternal Grandmother      Social History  Substance Use Topics  . Smoking status: Former Smoker -- 1 years  .  Smokeless tobacco: Never Used  . Alcohol Use: 0.0 oz/week    0 Standard drinks or equivalent per week     Comment: occasionally    Allergies as of 08/28/2015  . (No Known Allergies)    Review of Systems:    All systems reviewed and negative except where noted in HPI.   Physical Exam:  BP 134/70 mmHg  Pulse 95  Temp(Src) 98 F (36.7 C) (Oral)  Ht 4\' 11"  (1.499 m)  Wt 223 lb (101.152 kg)  BMI 45.02 kg/m2 No LMP recorded. Patient is not currently having periods (Reason: IUD). Psych:  Alert and cooperative. Normal mood and affect. General:   Alert,  Well-developed, obese, well-nourished, pleasant and cooperative in  NAD Head:  Normocephalic and atraumatic. Eyes:  Sclera clear, no icterus.   Conjunctiva pink. Ears:  Normal auditory acuity. Nose:  No deformity, discharge, or lesions. Mouth:  No deformity or lesions,oropharynx pink & moist. Neck:  Supple; no masses or thyromegaly. Lungs:  Respirations even and unlabored.  Clear throughout to auscultation.   No wheezes, crackles, or rhonchi. No acute distress. Heart:  Regular rate and rhythm; no murmurs, clicks, rubs, or gallops. Abdomen:  Normal bowel sounds.  No bruits.  Soft, non-tender and non-distended without masses, hepatosplenomegaly or hernias noted.  No guarding or rebound tenderness.  Negative Carnett sign.   Rectal:  Deferred.  Msk:  Symmetrical without gross deformities.  Good, equal movement & strength bilaterally. Pulses:  Normal pulses noted. Extremities:  No clubbing or edema.  No cyanosis. Neurologic:  Alert and oriented x3;  grossly normal neurologically. Skin:  Intact without significant lesions or rashes.  No jaundice. Lymph Nodes:  No significant cervical adenopathy. Psych:  Alert and cooperative. Normal mood and affect.  Imaging Studies: No results found.  Assessment and Plan:   Kelly Matthews is a 49 y.o. y/o female who comes in today with a history of abnormal liver enzymes. The patient's liver enzymes have been persistently elevated. The patient is obese and has been told the importance of losing weight for fatty liver. The patient will also have labs sent off for other possible causes of liver enzyme elevation. The patient will also have her blood checked for immunity to hepatitis A and B to determine whether she needs vaccinations for these. The patient will also be given some Zofran for her nausea to be taken as needed. Has been explained the plan and agrees with it   Note: This dictation was prepared with Dragon dictation along with smaller phrase technology. Any transcriptional errors that result from this process  are unintentional.

## 2015-08-29 LAB — EXTRACTABLE NUCLEAR ANTIGEN ANTIBODY
ENA RNP Ab: 0.2 AI (ref 0.0–0.9)
ENA SSB (LA) Ab: <0.2 AI (ref 0.0–0.9)
dsDNA Ab: 2 IU/mL (ref 0–9)

## 2015-08-29 LAB — HEPATIC FUNCTION PANEL
ALBUMIN: 4.7 g/dL (ref 3.5–5.5)
ALK PHOS: 108 IU/L (ref 39–117)
ALT: 151 IU/L — ABNORMAL HIGH (ref 0–32)
AST: 90 IU/L — AB (ref 0–40)
Bilirubin Total: 0.6 mg/dL (ref 0.0–1.2)
Bilirubin, Direct: 0.15 mg/dL (ref 0.00–0.40)
TOTAL PROTEIN: 7.4 g/dL (ref 6.0–8.5)

## 2015-08-29 LAB — HEPATITIS A ANTIBODY, TOTAL: HEP A TOTAL AB: POSITIVE — AB

## 2015-08-29 LAB — ANTI-SMOOTH MUSCLE ANTIBODY, IGG: Smooth Muscle Ab: 8 Units (ref 0–19)

## 2015-08-29 LAB — ALPHA-1-ANTITRYPSIN: A-1 Antitrypsin: 140 mg/dL (ref 90–200)

## 2015-08-29 LAB — HEPATITIS C ANTIBODY: Hep C Virus Ab: <0.1 s/co ratio (ref 0.0–0.9)

## 2015-08-29 LAB — HEPATITIS B SURFACE ANTIBODY,QUALITATIVE: Hep B Surface Ab, Qual: REACTIVE

## 2015-08-29 LAB — CERULOPLASMIN: Ceruloplasmin: 35.7 mg/dL (ref 19.0–39.0)

## 2015-08-29 LAB — IGG, IGA, IGM
IGA/IMMUNOGLOBULIN A, SERUM: 204 mg/dL (ref 87–352)
IGG (IMMUNOGLOBIN G), SERUM: 821 mg/dL (ref 700–1600)
IgM (Immunoglobulin M), Srm: 118 mg/dL (ref 26–217)

## 2015-08-29 LAB — HEPATITIS B SURFACE ANTIGEN: HEP B S AG: NEGATIVE

## 2015-08-29 LAB — MITOCHONDRIAL ANTIBODIES: MITOCHONDRIAL AB: 20.1 U — AB (ref 0.0–20.0)

## 2015-08-30 LAB — HCV RNA QUANT RFLX ULTRA OR GENOTYP: HCV Quant Baseline: NOT DETECTED IU/mL

## 2015-09-04 ENCOUNTER — Telehealth: Payer: Self-pay

## 2015-09-04 NOTE — Telephone Encounter (Signed)
-----   Message from Lucilla Lame, MD sent at 09/03/2015  3:55 PM EDT ----- Let the patient know that her liver enzymes were higher than before and the blood test showed that she does not need a vaccination for hepatitis A or B. The patient needs to try and lose weight over the next few months and then have her labs rechecked in 2 months and see if the liver enzymes are going down. If they are not then the patient may need to undergo a liver biopsy.

## 2015-09-04 NOTE — Telephone Encounter (Signed)
Pt notified of results. Told pt I would call her in 2 months to repeat her Hepatic function and advised her if these were still elevated Dr. Allen Norris has recommend a liver biospy. Pt agreed.

## 2015-09-19 ENCOUNTER — Other Ambulatory Visit: Payer: Self-pay | Admitting: Family Medicine

## 2015-09-19 DIAGNOSIS — E119 Type 2 diabetes mellitus without complications: Secondary | ICD-10-CM

## 2015-09-19 DIAGNOSIS — Z794 Long term (current) use of insulin: Principal | ICD-10-CM

## 2015-09-19 MED ORDER — CANAGLIFLOZIN 100 MG PO TABS: 100.0000 mg | ORAL_TABLET | Freq: Every day | ORAL | Status: DC

## 2015-09-19 NOTE — Telephone Encounter (Signed)
Pt contacted office for refill request on the following medications:  canagliflozin (INVOKANA) 100 MG TABS.  [facility].  CB#[id number]  Pt states her glucose reading for first thing in the morning is 143 to 147/MW

## 2015-09-26 ENCOUNTER — Telehealth: Payer: Self-pay | Admitting: Family Medicine

## 2015-09-26 NOTE — Telephone Encounter (Signed)
Samples placed at front desk for pick up. Patient is aware.

## 2015-09-26 NOTE — Telephone Encounter (Signed)
We do have Invokana 100 mg samples in stock. Please advise.

## 2015-09-26 NOTE — Telephone Encounter (Signed)
Pt stated that she is having problems with her insurance covering her canagliflozin (INVOKANA) 100 MG TABS tablet and would like to get samples until her insurance is figured out if they are going to cover the RX. Pt was sure if it needs a PA and she is going to contact the pharmacy. Please advise. Thanks TNP

## 2015-09-26 NOTE — Telephone Encounter (Signed)
May give 3 week samples of the Invokana 100 mg qd.

## 2015-09-27 ENCOUNTER — Other Ambulatory Visit: Payer: Self-pay

## 2015-09-27 DIAGNOSIS — Z794 Long term (current) use of insulin: Principal | ICD-10-CM

## 2015-09-27 DIAGNOSIS — E119 Type 2 diabetes mellitus without complications: Secondary | ICD-10-CM

## 2015-09-27 MED ORDER — CANAGLIFLOZIN 100 MG PO TABS: 100.0000 mg | ORAL_TABLET | Freq: Every day | ORAL | Status: DC

## 2015-09-27 NOTE — Telephone Encounter (Signed)
[facility] [place], thanks-aa

## 2015-10-04 ENCOUNTER — Telehealth: Payer: Self-pay

## 2015-10-04 NOTE — Telephone Encounter (Signed)
Patient calling to check on status of PA form for her invokana. Ulis Rias said she sent form back to [place]. Please review and let her know. She has enough samples right now. Thank you=aa

## 2015-10-04 NOTE — Telephone Encounter (Signed)
Please advise 

## 2015-10-05 NOTE — Telephone Encounter (Signed)
Have a [employer] card to set up for $0 Co-pay for Invokana each month. She will need to come pick up the card and call the [phone]-[phone] number to set it up to take to the pharmacy. This should be better than continuing the prior authorization process because it looks like the insurance wants her to use other drugs before getting the [place].

## 2015-10-07 NOTE — Telephone Encounter (Signed)
Discount card at front desk for pickup. Patient is aware.

## 2015-10-22 ENCOUNTER — Telehealth: Payer: Self-pay

## 2015-10-22 DIAGNOSIS — E119 Type 2 diabetes mellitus without complications: Secondary | ICD-10-CM

## 2015-10-22 DIAGNOSIS — J4520 Mild intermittent asthma, uncomplicated: Secondary | ICD-10-CM

## 2015-10-22 MED ORDER — ONETOUCH ULTRA BLUE VI STRP: ORAL_STRIP | Status: DC

## 2015-10-22 MED ORDER — MONTELUKAST SODIUM 10 MG PO TABS: 10.0000 mg | ORAL_TABLET | Freq: Every day | ORAL | Status: DC

## 2015-10-22 MED ORDER — METFORMIN HCL 500 MG PO TABS: 500.0000 mg | ORAL_TABLET | Freq: Two times a day (BID) | ORAL | Status: DC

## 2015-10-22 NOTE — Telephone Encounter (Addendum)
[facility] received from [facility] requesting a 90 day supply of One Touch Ultra test strips, Montelukast Sodium 10 mg, and Metformin 500 mg.

## 2015-10-30 ENCOUNTER — Telehealth: Payer: Self-pay | Admitting: Family Medicine

## 2015-10-30 NOTE — Telephone Encounter (Signed)
Please advise 

## 2015-10-30 NOTE — Telephone Encounter (Signed)
Pt called saying the invokana was too expensive .  She will not be able to afford this medication.  She used the coupon but that was for one month.  She wants to know what do you want her to take.  Her call back is [phone]  Thanks Con Memos

## 2015-10-31 NOTE — Telephone Encounter (Signed)
The discount card give to her was for a year. Ask the patient which pharmacy she is using. Call [phone] ([place] [employer] [facility]) to get additional assistance with this card.

## 2015-11-05 ENCOUNTER — Ambulatory Visit: Payer: Managed Care, Other (non HMO) | Admitting: Family Medicine

## 2015-11-07 ENCOUNTER — Ambulatory Visit: Payer: Managed Care, Other (non HMO) | Admitting: Family Medicine

## 2015-11-22 ENCOUNTER — Encounter: Payer: Self-pay | Admitting: Family Medicine

## 2015-11-22 ENCOUNTER — Ambulatory Visit (INDEPENDENT_AMBULATORY_CARE_PROVIDER_SITE_OTHER): Payer: Managed Care, Other (non HMO) | Admitting: Family Medicine

## 2015-11-22 VITALS — BP 142/94 | HR 66 | Temp 97.7°F | Resp 16 | Wt 226.8 lb

## 2015-11-22 DIAGNOSIS — I10 Essential (primary) hypertension: Secondary | ICD-10-CM | POA: Diagnosis not present

## 2015-11-22 DIAGNOSIS — Z794 Long term (current) use of insulin: Secondary | ICD-10-CM | POA: Diagnosis not present

## 2015-11-22 DIAGNOSIS — E119 Type 2 diabetes mellitus without complications: Secondary | ICD-10-CM | POA: Diagnosis not present

## 2015-11-22 DIAGNOSIS — I471 Supraventricular tachycardia, unspecified: Secondary | ICD-10-CM

## 2015-11-22 DIAGNOSIS — E7800 Pure hypercholesterolemia, unspecified: Secondary | ICD-10-CM

## 2015-11-22 NOTE — Progress Notes (Signed)
Patient ID: Kelly Matthews, female   DOB: [birth year], 50 y.o.   MRN: [id number]   Patient: Kelly Matthews Female    DOB: 04-02-1966   50 y.o.   MRN: [id number] Visit Date: 11/22/2015  Today's Provider: [name], PA   Chief Complaint  Patient presents with  . Hypertension  . Diabetes  . Hyperlipidemia  . Follow-up   Subjective:    Hypertension This is a chronic problem. The current episode started more than 1 year ago. The problem is controlled. Past treatments include ACE inhibitors, diuretics and calcium channel blockers. The current treatment provides significant improvement. There are no compliance problems.   Diabetes She presents for her follow-up diabetic visit. She has type 2 diabetes mellitus. Her disease course has been stable. There are no hypoglycemic associated symptoms. Associated symptoms include visual change. Symptoms are stable. Current diabetic treatment includes oral agent (dual therapy). She is compliant with treatment all of the time. She is following a diabetic and low fat/cholesterol diet. Her breakfast blood glucose is taken between 7-8 am. Her breakfast blood glucose range is generally 140-180 mg/dl. Eye exam current: To get accomplished next week.  Hyperlipidemia This is a chronic problem. The current episode started more than 1 year ago. The problem is controlled. Current antihyperlipidemic treatment includes diet change and herbal therapy. Risk factors for coronary artery disease include diabetes mellitus and hypertension.   Patient Active Problem List   Diagnosis Date Noted  . Allergic rhinitis 07/23/2015  . Excess, menstruation 07/19/2015  . 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 Blood glucose elevated 07/19/2015  . Airway hyperreactivity 07/19/2015  . Disorder of esophagus 07/19/2015  . Hypercholesterolemia without hypertriglyceridemia 07/19/2015  . Borderline diabetes 07/19/2015  . Diabetes ([place]) 11/28/2014  . Essential  (primary) hypertension 11/28/2014  . Aortic heart valve narrowing 11/28/2014  . 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 Supraventricular tachycardia ([place]) 11/28/2014  . 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 Cesarean section  [id number]  . Breast biopsy Left 04/18/2015    Benign calcification  . Intrauterine device insertion  October 2015    [facility]   Family History  Problem Relation Age of Onset  . Coronary artery disease Mother   . Coronary artery disease Father   . Diabetes Father   . Emphysema Father   . Seizures Daughter   . Cancer Maternal Grandmother    No Known Allergies   Previous Medications   BUDESONIDE (PULMICORT FLEXHALER) 180 MCG/ACT INHALER       CANAGLIFLOZIN (INVOKANA) 100 MG TABS TABLET    Take 1 tablet (100 mg total) by mouth daily.   DILTIAZEM (DILACOR XR) 120 MG 24 HR CAPSULE    1 capsule daily.   DOXYCYCLINE (VIBRAMYCIN) 100 MG CAPSULE       FLUTICASONE (FLONASE) 50 MCG/ACT NASAL SPRAY    SPRAY 2 SPRAYS IN EACH NOSTRIL DAILY   HYDROCHLOROTHIAZIDE (MICROZIDE) 12.5 MG CAPSULE    1 capsule daily.   LISINOPRIL (PRINIVIL,ZESTRIL) 5 MG TABLET    Take 1 tablet (5 mg total) by mouth daily.   METFORMIN (GLUCOPHAGE) 500 MG TABLET    Take 1 tablet (500 mg total) by mouth 2 (two) times daily.   METHOCARBAMOL (ROBAXIN) 500 MG TABLET    Take 1 tablet (500 mg total) by mouth 4 (four) times daily.   MONTELUKAST (SINGULAIR) 10 MG TABLET    Take 1 tablet (10 mg total) by  mouth daily.   ONDANSETRON (ZOFRAN) 4 MG TABLET    Take 1 tablet (4 mg total) by mouth every 6 (six) hours as needed for nausea or vomiting.   ONE TOUCH ULTRA TEST TEST STRIP    USE 1 STRIP TWICE DAILY AS NEEDED   ONETOUCH DELICA LANCETS 99991111 MISC    USE TO CHECK BLOOD SUGAR TWICE A DAY    Review of Systems  Constitutional: Negative.   HENT: Negative.   Eyes: Negative.   Respiratory: Negative.   Cardiovascular:  Negative.   Gastrointestinal: Negative.   Endocrine: Negative.   Genitourinary: Negative.   Musculoskeletal: Negative.   Skin: Negative.   Allergic/Immunologic: Negative.   Neurological: Negative.   Hematological: Negative.   Psychiatric/Behavioral: Negative.     Social History  Substance Use Topics  . Smoking status: Former Smoker -- 1 years  . Smokeless tobacco: Never Used  . Alcohol Use: 0.0 oz/week    0 Standard drinks or equivalent per week     Comment: occasionally   Objective:   BP 142/94 mmHg  Pulse 66  Temp(Src) 97.7 F (36.5 C) (Oral)  Resp 16  Wt 226 lb 12.8 oz (102.876 kg) Wt Readings from Last 3 Encounters:  11/22/15 226 lb 12.8 oz (102.876 kg)  08/28/15 223 lb (101.152 kg)  08/06/15 226 lb 12.8 oz (102.876 kg)    Physical Exam  Constitutional: She is oriented to person, place, and time. She appears well-developed and well-nourished. No distress.  HENT:  Head: Normocephalic and atraumatic.  Right Ear: Hearing normal.  Left Ear: Hearing normal.  Nose: Nose normal.  Eyes: Conjunctivae and lids are normal. Right eye exhibits no discharge. Left eye exhibits no discharge. No scleral icterus.  Pulmonary/Chest: Effort normal. No respiratory distress.  Musculoskeletal: Normal range of motion.  Neurological: She is alert and oriented to person, place, and time.  Skin: Skin is intact. No lesion and no rash noted.  Psychiatric: She has a normal mood and affect. Her speech is normal and behavior is normal. Thought content normal.      Assessment & Plan:      1. Essential (primary) hypertension Tolerating Lisinopril and HCTZ with Diltiazem without side effects. Did not take medications today and BP slightly elevated. Will get back on regular dosing and follow up with cardiologist soon. - CBC with Differential/Platelet  2. Type 2 diabetes mellitus without complication, with long-term current use of insulin (HCC) BS around 140-180 recently on the Metformin 500 mg  BID and Invokana 100 mg qd. Having some trouble getting the $0 Co-pay Coupon to work. Given an new card to check with proper activation. Given samples of Invokana for 30 days. If still having difficulty getting the medication, may need to consider change to Jardiance. Will recheck labs and follow up in 3 months. Plans ophthalmology evaluation in a week or two. - COMPLETE METABOLIC PANEL WITH GFR - Hemoglobin A1c  3. Hypercholesterolemia without hypertriglyceridemia Trying to follow low fat diet and losing some weight. Doesn't want to add anymore medications if possible. Recheck labs today. - Lipid panel - TSH  4. Supraventricular tachycardia ([place]) Well controlled without palpitations on the Diltiazem. Has continued annual follow up with cardiologist (Dr. Nehemiah Massed).

## 2015-11-23 LAB — TSH: TSH: 2.09 u[IU]/mL (ref 0.450–4.500)

## 2015-11-23 LAB — COMPREHENSIVE METABOLIC PANEL
A/G RATIO: 1.6 (ref 1.1–2.5)
ALBUMIN: 4.2 g/dL (ref 3.5–5.5)
ALT: 47 IU/L — AB (ref 0–32)
AST: 23 IU/L (ref 0–40)
Alkaline Phosphatase: 99 IU/L (ref 39–117)
BUN/Creatinine Ratio: 25 — ABNORMAL HIGH (ref 9–23)
BUN: 13 mg/dL (ref 6–24)
Bilirubin Total: 0.3 mg/dL (ref 0.0–1.2)
CALCIUM: 9.8 mg/dL (ref 8.7–10.2)
CO2: 23 mmol/L (ref 18–29)
CREATININE: 0.51 mg/dL — AB (ref 0.57–1.00)
Chloride: 98 mmol/L (ref 96–106)
GFR calc Af Amer: 131 mL/min/{1.73_m2} (ref >59)
GFR, EST NON AFRICAN AMERICAN: 114 mL/min/{1.73_m2} (ref >59)
Globulin, Total: 2.7 g/dL (ref 1.5–4.5)
Glucose: 148 mg/dL — ABNORMAL HIGH (ref 65–99)
POTASSIUM: 4.1 mmol/L (ref 3.5–5.2)
Sodium: 137 mmol/L (ref 134–144)
TOTAL PROTEIN: 6.9 g/dL (ref 6.0–8.5)

## 2015-11-23 LAB — CBC WITH DIFFERENTIAL/PLATELET
BASOS: 0 %
Basophils Absolute: 0 10*3/uL (ref 0.0–0.2)
EOS (ABSOLUTE): 0.1 10*3/uL (ref 0.0–0.4)
EOS: 0 %
HEMATOCRIT: 39.9 % (ref 34.0–46.6)
HEMOGLOBIN: 13.4 g/dL (ref 11.1–15.9)
Immature Grans (Abs): 0.2 10*3/uL — ABNORMAL HIGH (ref 0.0–0.1)
Immature Granulocytes: 1 %
LYMPHS ABS: 2.1 10*3/uL (ref 0.7–3.1)
Lymphs: 17 %
MCH: 27.2 pg (ref 26.6–33.0)
MCHC: 33.6 g/dL (ref 31.5–35.7)
MCV: 81 fL (ref 79–97)
MONOCYTES: 5 %
MONOS ABS: 0.6 10*3/uL (ref 0.1–0.9)
NEUTROS ABS: 9.2 10*3/uL — AB (ref 1.4–7.0)
Neutrophils: 77 %
Platelets: 123 10*3/uL — ABNORMAL LOW (ref 150–379)
RBC: 4.93 x10E6/uL (ref 3.77–5.28)
RDW: 14.4 % (ref 12.3–15.4)
WBC: 11.9 10*3/uL — AB (ref 3.4–10.8)

## 2015-11-23 LAB — HEMOGLOBIN A1C
Est. average glucose Bld gHb Est-mCnc: 151 mg/dL
HEMOGLOBIN A1C: 6.9 % — AB (ref 4.8–5.6)

## 2015-11-23 LAB — LIPID PANEL
CHOL/HDL RATIO: 3.6 ratio (ref 0.0–4.4)
Cholesterol, Total: 167 mg/dL (ref 100–199)
HDL: 47 mg/dL (ref >39)
LDL CALC: 109 mg/dL — AB (ref 0–99)
TRIGLYCERIDES: 57 mg/dL (ref 0–149)
VLDL Cholesterol Cal: 11 mg/dL (ref 5–40)

## 2015-11-26 ENCOUNTER — Telehealth: Payer: Self-pay

## 2015-11-26 NOTE — Telephone Encounter (Signed)
Left message to call back  

## 2015-11-26 NOTE — Telephone Encounter (Signed)
Advised patient as below.  

## 2015-11-26 NOTE — Telephone Encounter (Signed)
-----   Message from Margo Common, [place] sent at 11/25/2015 11:22 PM EST ----- No anemia and lipids are in good shape. Hgb A1C finally below the goal of 7.0. Received notice of approval on 11-22-15 for the Invokana. Recheck in 3 months.

## 2016-01-29 IMAGING — MG MM DIGITAL DIAGNOSTIC UNILAT*L*
1 series · 2 of 2 positions shown · non-contrast
Comparison: Previous exam(s).

CLINICAL DATA: Status post stereotactic guided core needle biopsy
left breast calcifications

EXAM:
DIAGNOSTIC LEFT MAMMOGRAM POST STEREOTACTIC BIOPSY

[L CC · left · 2 of 2 slices shown]
[im 1/2]
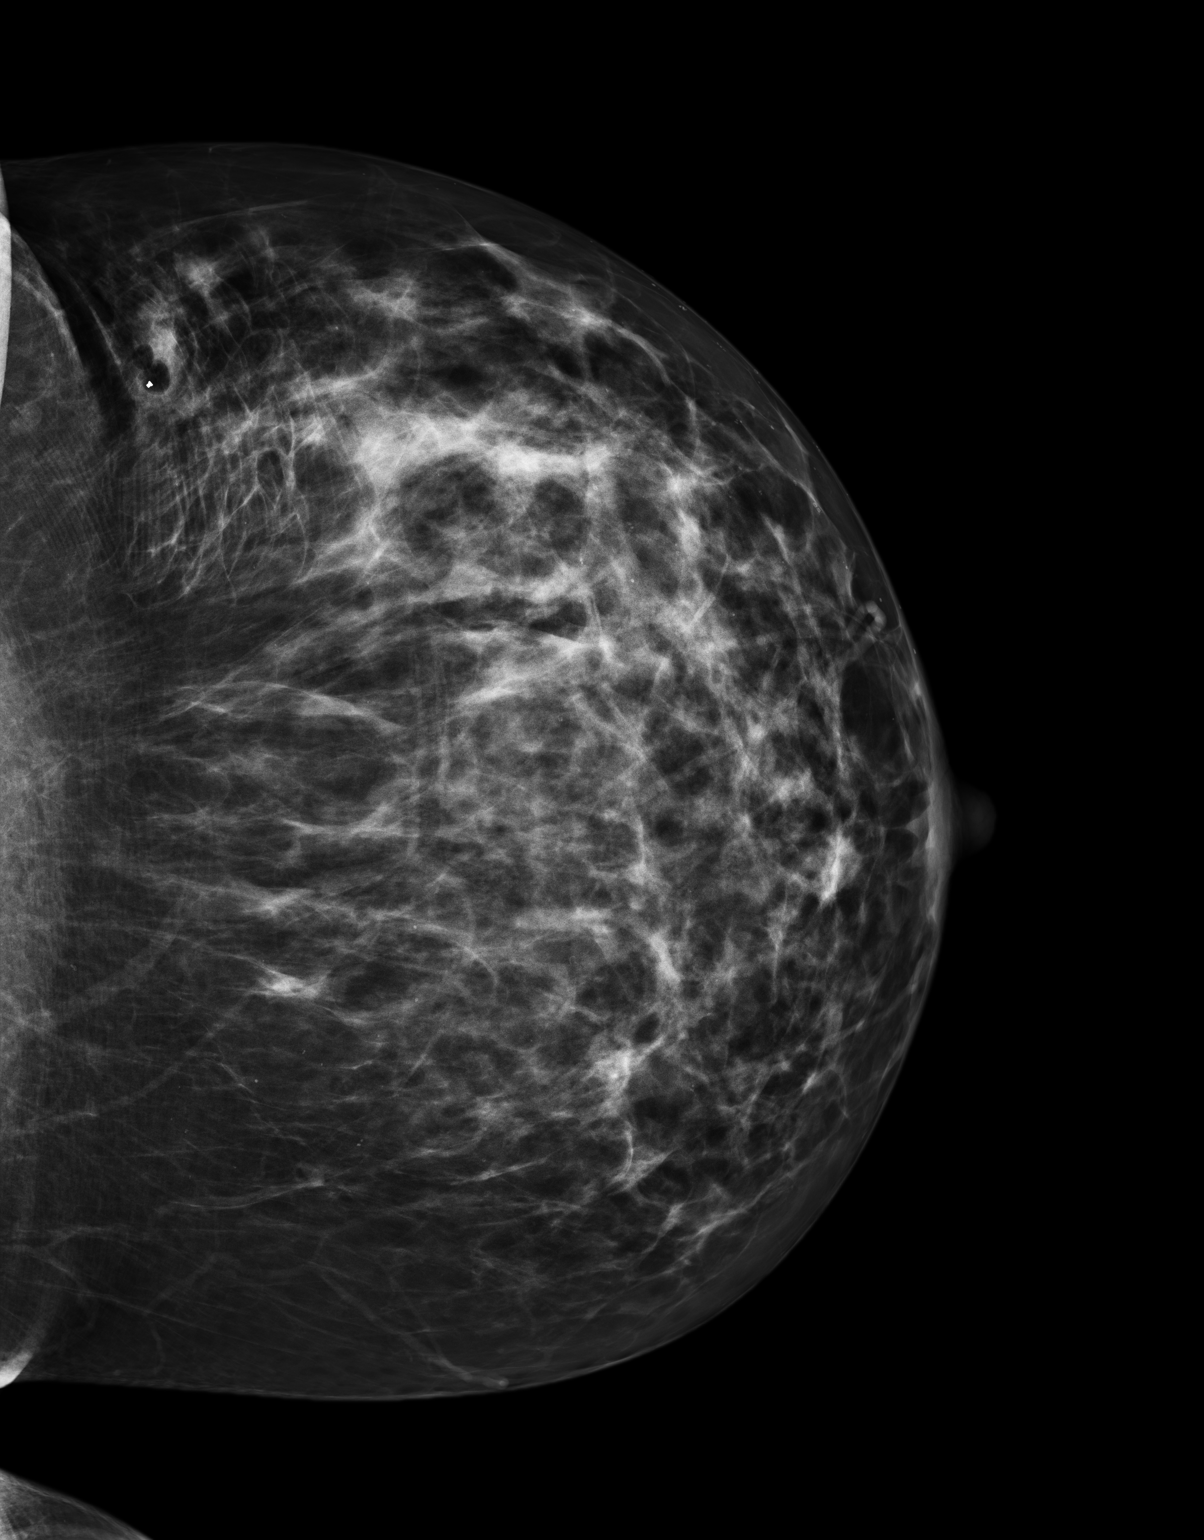
[im 2/2]
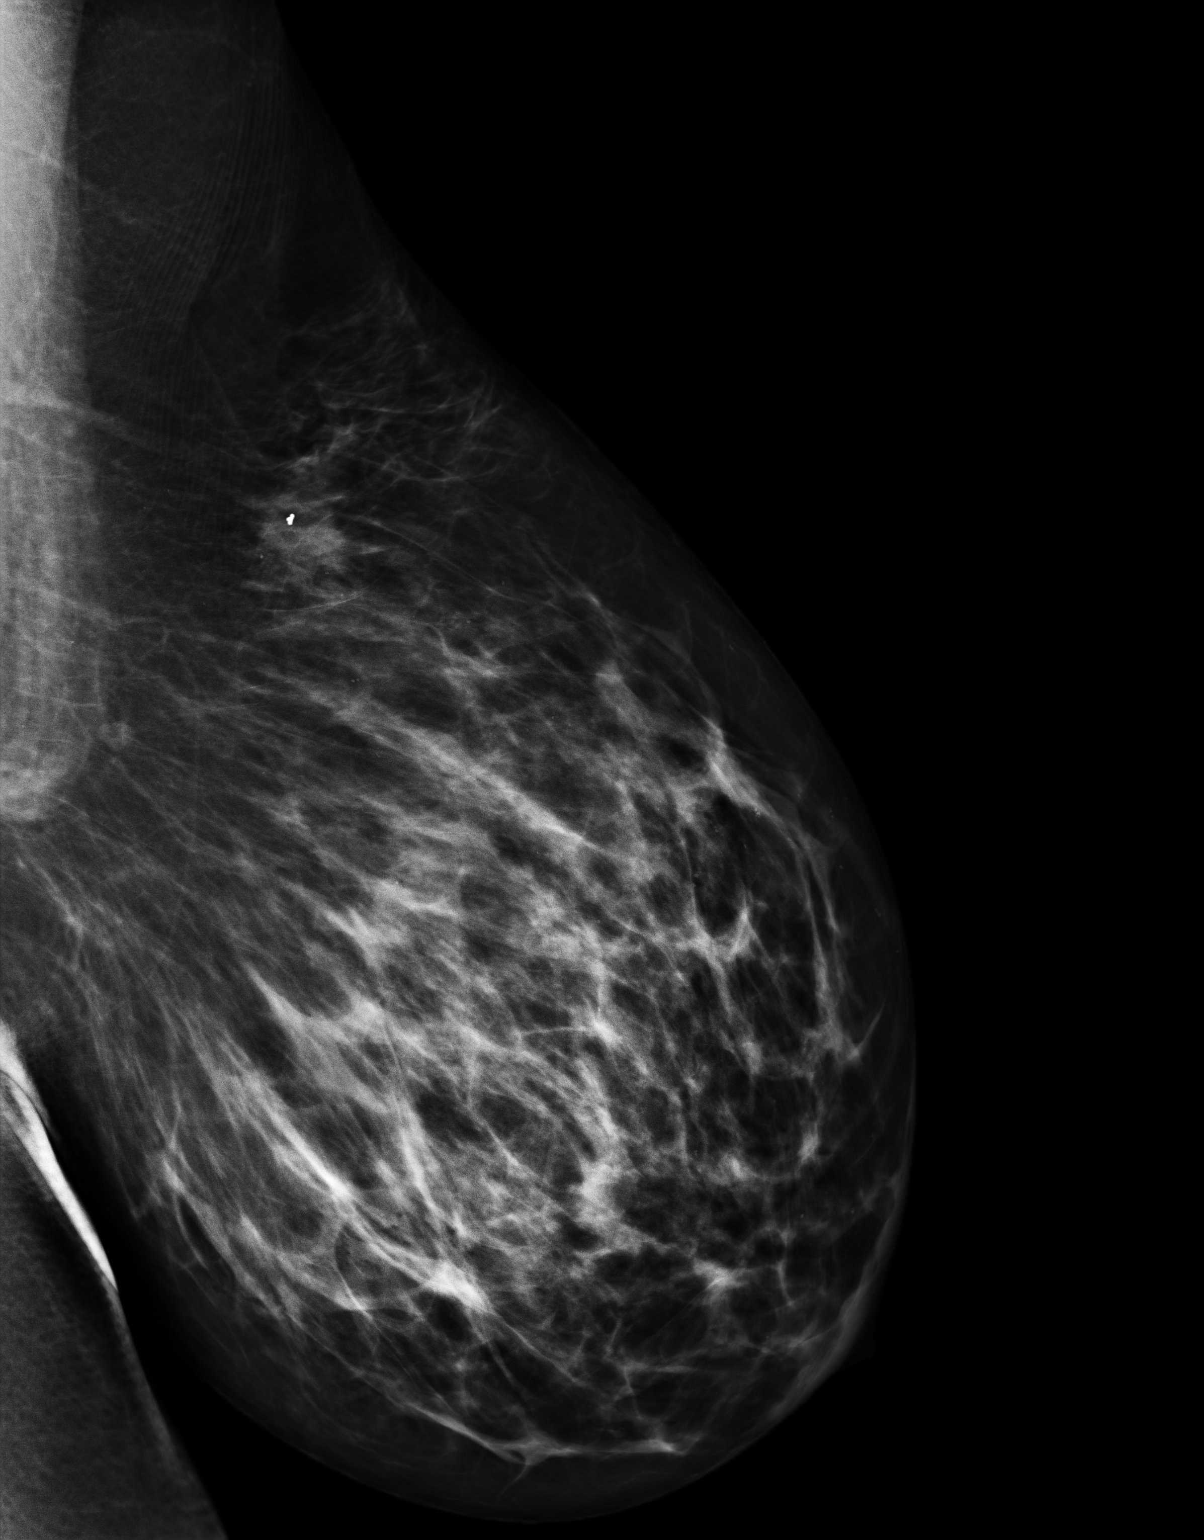

[2 of 2 positions shown; findings below may reference images not displayed]

FINDINGS: Mammographic images were obtained following stereotactic guided
biopsy of left breast calcifications. Top hat shaped marking clip in
appropriate position
IMPRESSION: Top hat shaped marking clip in appropriate position upper outer left
breast.

Final Assessment: Post Procedure Mammograms for Marker Placement

## 2016-03-11 ENCOUNTER — Other Ambulatory Visit: Payer: Self-pay | Admitting: Family Medicine

## 2016-03-11 DIAGNOSIS — Z1231 Encounter for screening mammogram for malignant neoplasm of breast: Secondary | ICD-10-CM

## 2016-04-06 ENCOUNTER — Ambulatory Visit
Admission: RE | Admit: 2016-04-06 | Discharge: 2016-04-06 | Disposition: A | Discharge status: Home / Self Care | Payer: Managed Care, Other (non HMO) | Source: Ambulatory Visit | Attending: Family Medicine | Admitting: Family Medicine

## 2016-04-06 DIAGNOSIS — Z1231 Encounter for screening mammogram for malignant neoplasm of breast: Secondary | ICD-10-CM | POA: Diagnosis present

## 2016-04-24 ENCOUNTER — Ambulatory Visit (INDEPENDENT_AMBULATORY_CARE_PROVIDER_SITE_OTHER): Payer: Managed Care, Other (non HMO) | Admitting: Family Medicine

## 2016-04-24 ENCOUNTER — Encounter: Payer: Self-pay | Admitting: Family Medicine

## 2016-04-24 VITALS — BP 128/80 | HR 68 | Temp 97.7°F | Resp 16 | Ht 59.0 in | Wt 222.0 lb

## 2016-04-24 DIAGNOSIS — G43009 Migraine without aura, not intractable, without status migrainosus: Secondary | ICD-10-CM | POA: Diagnosis not present

## 2016-04-24 MED ORDER — SUMATRIPTAN SUCCINATE 50 MG PO TABS: ORAL_TABLET | ORAL | Status: DC

## 2016-04-24 NOTE — Progress Notes (Signed)
Patient ID: Kelly Matthews, female   DOB: 10/05/1966, 50 y.o.   MRN: [id number]       Patient: Kelly Matthews Female    DOB: 08/25/1966   50 y.o.   MRN: [id number] Visit Date: 04/24/2016  Today's Provider: [name], PA   Chief Complaint  Patient presents with  . Migraine    x2 weeks.    Subjective:    Migraine  This is a recurrent problem. The current episode started 1 to 4 weeks ago. The problem occurs constantly. The problem has been gradually worsening. The pain is located in the frontal region. The pain does not radiate. The pain quality is similar to prior headaches. The quality of the pain is described as aching and throbbing. The pain is at a severity of 8/10. The pain is moderate (pain was severe last night). Pertinent negatives include no blurred vision, dizziness, nausea, visual change or vomiting. Nothing aggravates the symptoms. She has tried acetaminophen and NSAIDs for the symptoms. The treatment provided no relief.   Patient Active Problem List   Diagnosis Date Noted  . Allergic rhinitis 07/23/2015  . Excess, menstruation 07/19/2015  . 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 Blood glucose elevated 07/19/2015  . Airway hyperreactivity 07/19/2015  . Disorder of esophagus 07/19/2015  . Hypercholesterolemia without hypertriglyceridemia 07/19/2015  . Borderline diabetes 07/19/2015  . Diabetes ([place]) 11/28/2014  . Essential (primary) hypertension 11/28/2014  . Aortic heart valve narrowing 11/28/2014  . 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 Supraventricular tachycardia ([place]) 11/28/2014  . 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 Diabetes mellitus without complication ([place])   . Hypertension    Past Surgical History  Procedure Laterality Date  . Cesarean section  [id number]  . Breast biopsy Left 04/18/2015    Benign calcification  . Intrauterine device insertion  October  2015    [facility]   Family History  Problem Relation Age of Onset  . Coronary artery disease Mother   . Coronary artery disease Father   . Diabetes Father   . Emphysema Father   . Seizures Daughter   . Cancer Maternal Grandmother   . Breast cancer Neg Hx     No Known Allergies   Current Meds  Medication Sig  . budesonide (PULMICORT FLEXHALER) 180 MCG/ACT inhaler   . diltiazem (DILACOR XR) 120 MG 24 hr capsule 1 capsule daily.  . fluticasone (FLONASE) 50 MCG/ACT nasal spray SPRAY 2 SPRAYS IN EACH NOSTRIL DAILY  . hydrochlorothiazide (MICROZIDE) 12.5 MG capsule 1 capsule daily.  . metFORMIN (GLUCOPHAGE) 500 MG tablet Take 1 tablet (500 mg total) by mouth 2 (two) times daily.  . montelukast (SINGULAIR) 10 MG tablet Take 1 tablet (10 mg total) by mouth daily.  . ondansetron (ZOFRAN) 4 MG tablet Take 1 tablet (4 mg total) by mouth every 6 (six) hours as needed for nausea or vomiting.  . ONE TOUCH ULTRA TEST test strip USE 1 STRIP TWICE DAILY AS NEEDED  . ONETOUCH DELICA LANCETS 99991111 MISC USE TO CHECK BLOOD SUGAR TWICE A DAY  . [DISCONTINUED] canagliflozin (INVOKANA) 100 MG TABS tablet Take 1 tablet (100 mg total) by mouth daily.  . [DISCONTINUED] doxycycline (VIBRAMYCIN) 100 MG capsule   . [DISCONTINUED] lisinopril (PRINIVIL,ZESTRIL) 5 MG tablet Take 1 tablet (5 mg total) by mouth daily.  . [DISCONTINUED] methocarbamol (ROBAXIN) 500 MG tablet Take 1 tablet (500 mg total) by mouth 4 (four) times daily.  Review of Systems  Eyes: Negative for blurred vision.  Gastrointestinal: Negative for nausea and vomiting.  Neurological: Positive for headaches. Negative for dizziness.       Right frontal headache usually lasting no more than a day. Had a lot of migraines a few years ago and was treated with Imitrex and Ibuprofen.    Social History  Substance Use Topics  . Smoking status: Former Smoker -- 1 years  . Smokeless tobacco: Never Used  . Alcohol Use: 0.0 oz/week    0 Standard  drinks or equivalent per week     Comment: occasionally   Objective:   BP 128/80 mmHg  Pulse 68  Temp(Src) 97.7 F (36.5 C)  Resp 16  Ht 4\' 11"  (1.499 m)  Wt 222 lb (100.699 kg)  BMI 44.81 kg/m2  Physical Exam  Constitutional: She is oriented to person, place, and time. She appears well-developed and well-nourished. No distress.  HENT:  Head: Normocephalic and atraumatic.  Right Ear: Hearing and external ear normal.  Left Ear: Hearing and external ear normal.  Nose: Nose normal.  Mouth/Throat: Oropharynx is clear and moist.  Eyes: Conjunctivae, EOM and lids are normal. Pupils are equal, round, and reactive to light. Right eye exhibits no discharge. Left eye exhibits no discharge. No scleral icterus.  Slight photosensitivity with right frontal throbbing headache.  Neck: Neck supple.  Cardiovascular: Normal rate and regular rhythm.   Pulmonary/Chest: Effort normal and breath sounds normal. No respiratory distress.  Musculoskeletal: Normal range of motion.  Lymphadenopathy:    She has no cervical adenopathy.  Neurological: She is alert and oriented to person, place, and time. She displays normal reflexes. Coordination normal.  Skin: Skin is intact. No lesion and no rash noted.  Psychiatric: She has a normal mood and affect. Her speech is normal and behavior is normal. Thought content normal.      Assessment & Plan:     1. Migraine without aura and without status migrainosus, not intractable Intermittent over the past 3-4 weeks. Usually last no more than a day. Not much help with Ibuprofen. Has had Imitrex in the past with good results. No nausea or vomiting but slight photosensitivity to bright lights. May use the Zofran she has at home if nausea develops. Given prescription for Imitrex and may use Aleve BID with it. If headache not cleared in 3 days, need to recheck. Due to schedule for CPE in the next 1-2 months. - SUMAtriptan (IMITREX) 50 MG tablet; Take one tablet by mouth at  onset of migraine. May repeat in 2 hours if headache persists or recurs. Limit 2 doses in 24 hours.  Dispense: 10 tablet; Refill: [place]  [facility]

## 2016-04-27 ENCOUNTER — Telehealth: Payer: Self-pay | Admitting: Family Medicine

## 2016-04-27 MED ORDER — AMITRIPTYLINE HCL 10 MG PO TABS: 10.0000 mg | ORAL_TABLET | Freq: Three times a day (TID) | ORAL | Status: DC

## 2016-04-27 NOTE — Telephone Encounter (Signed)
Please review. Thanks!  

## 2016-04-27 NOTE — Telephone Encounter (Signed)
Pt stated that she started taking the SUMAtriptan (IMITREX) 50 MG tablet for migraines on Friday 04/24/16. Pt stated that the medication made her dizzy for about an hour and after that it did help. Pt stated that she woke up with a migraine Saturday 04/25/16 and Monday 04/27/16. Pt stated she didn't have one Sunday 04/26/16. Pt stated she took the medication Saturday and it helped after feeling dizzy for about an hour. Pt stated that she hasn't taking the medication this morning b/c she wanted to see what Simona Huh wanted her to do. Please advise. Thanks TNP

## 2016-04-27 NOTE — Telephone Encounter (Signed)
Advised patient as below. Medication was sent into the pharmacy. Appt was scheduled for next week.

## 2016-04-27 NOTE — Telephone Encounter (Signed)
Advise patient the dizziness is not an unusual side effect and no problem if it doesn't persist. The frequency of the migraines probably needs Amitriptyline 10 mg TID #60 to break this cycle of migraines. Recheck appointment in a week to assess progress.

## 2016-05-05 ENCOUNTER — Ambulatory Visit (INDEPENDENT_AMBULATORY_CARE_PROVIDER_SITE_OTHER): Payer: Managed Care, Other (non HMO) | Admitting: Family Medicine

## 2016-05-05 ENCOUNTER — Encounter: Payer: Self-pay | Admitting: Family Medicine

## 2016-05-05 VITALS — BP 118/72 | Temp 97.8°F | Resp 16 | Ht 59.0 in | Wt 217.0 lb

## 2016-05-05 DIAGNOSIS — G43009 Migraine without aura, not intractable, without status migrainosus: Secondary | ICD-10-CM | POA: Diagnosis not present

## 2016-05-05 DIAGNOSIS — G43909 Migraine, unspecified, not intractable, without status migrainosus: Secondary | ICD-10-CM | POA: Insufficient documentation

## 2016-05-05 NOTE — Patient Instructions (Signed)

## 2016-05-05 NOTE — Progress Notes (Signed)
Patient ID: Kelly Matthews, female   DOB: 06-11-1966, 50 y.o.   MRN: [id number]       Patient: Kelly Matthews Female    DOB: [birth year]   50 y.o.   MRN: [id number] Visit Date: 05/05/2016  Today's Provider: [name], PA   Chief Complaint  Patient presents with  . Migraine    follow up    Subjective:    HPI Patient comes in today for a follow up on migraine headaches. She was seen in the office on 04/24/2016 and was prescribed Imitrex 50mg . She reports that after taking the medication, it caused her to have dizziness and lightheadedness which lasted about 1 hour. It was recommended that she start Amitriptyline 10mg  TID. Patient reports that since adding amitriptyline, she has not had anymore migraines.    Patient Active Problem List   Diagnosis Date Noted  . Allergic rhinitis 07/23/2015  . Excess, menstruation 07/19/2015  . 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 Blood glucose elevated 07/19/2015  . Airway hyperreactivity 07/19/2015  . Disorder of esophagus 07/19/2015  . Hypercholesterolemia without hypertriglyceridemia 07/19/2015  . Borderline diabetes 07/19/2015  . Diabetes ([place]) 11/28/2014  . Essential (primary) hypertension 11/28/2014  . Aortic heart valve narrowing 11/28/2014  . 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 Supraventricular tachycardia ([place]) 11/28/2014  . 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 Cesarean section  [id number]  . Breast biopsy Left 04/18/2015    Benign calcification  . Intrauterine device insertion  October 2015    [facility]   Family History  Problem Relation Age of Onset  . Coronary artery disease Mother   . Coronary artery disease Father   . Diabetes Father   . Emphysema Father   . Seizures Daughter   . Cancer Maternal Grandmother   . Breast cancer Neg Hx    No Known Allergies   Current Meds  Medication Sig  . amitriptyline  (ELAVIL) 10 MG tablet Take 1 tablet (10 mg total) by mouth 3 (three) times daily.  . budesonide (PULMICORT FLEXHALER) 180 MCG/ACT inhaler   . diltiazem (DILACOR XR) 120 MG 24 hr capsule 1 capsule daily.  . fluticasone (FLONASE) 50 MCG/ACT nasal spray SPRAY 2 SPRAYS IN EACH NOSTRIL DAILY  . hydrochlorothiazide (MICROZIDE) 12.5 MG capsule 1 capsule daily.  . metFORMIN (GLUCOPHAGE) 500 MG tablet Take 1 tablet (500 mg total) by mouth 2 (two) times daily.  . montelukast (SINGULAIR) 10 MG tablet Take 1 tablet (10 mg total) by mouth daily.  . ondansetron (ZOFRAN) 4 MG tablet Take 1 tablet (4 mg total) by mouth every 6 (six) hours as needed for nausea or vomiting.  . ONE TOUCH ULTRA TEST test strip USE 1 STRIP TWICE DAILY AS NEEDED  . ONETOUCH DELICA LANCETS 99991111 MISC USE TO CHECK BLOOD SUGAR TWICE A DAY  . SUMAtriptan (IMITREX) 50 MG tablet Take one tablet by mouth at onset of migraine. May repeat in 2 hours if headache persists or recurs. Limit 2 doses in 24 hours.    Review of Systems  Constitutional: Negative.   HENT: Negative.   Musculoskeletal: Negative.   Neurological: Negative for dizziness, tremors, syncope, weakness, light-headedness, numbness and headaches.  Psychiatric/Behavioral: Negative.     Social History  Substance Use Topics  . Smoking status: Former Smoker -- 1 years  . Smokeless tobacco: Never Used  . Alcohol Use: 0.0 oz/week    0 Standard drinks or equivalent  per week     Comment: occasionally   Objective:   BP 118/72 mmHg  Resp 16  Wt 217 lb (98.431 kg)  Physical Exam  Constitutional: She is oriented to person, place, and time. She appears well-developed and well-nourished. No distress.  HENT:  Head: Normocephalic and atraumatic.  Right Ear: Hearing normal.  Left Ear: Hearing normal.  Nose: Nose normal.  Eyes: Conjunctivae and lids are normal. Right eye exhibits no discharge. Left eye exhibits no discharge. No scleral icterus.  Neck: Neck supple.    Cardiovascular: Normal rate and regular rhythm.   Pulmonary/Chest: Effort normal and breath sounds normal. No respiratory distress.  Musculoskeletal: Normal range of motion.  Neurological: She is alert and oriented to person, place, and time.  Skin: Skin is intact. No lesion and no rash noted.  Psychiatric: She has a normal mood and affect. Her speech is normal and behavior is normal. Thought content normal.      Assessment & Plan:     1. Migraine without aura and without status migrainosus, not intractable No further headaches since starting the Amitriptyline 10 mg TID. Has Imitrex to use if acute migraine starts (it causes some lightheaded sensation the first hour, but stops the migraine). May use the Amitriptyline throughout this week and try without it next week. If headaches return, should get back on it TID for a month and call the office. May have to use it regularly as preventive treatment.       [name], PA  [facility]

## 2016-05-17 ENCOUNTER — Other Ambulatory Visit: Payer: Self-pay | Admitting: Family Medicine

## 2016-05-17 DIAGNOSIS — J4520 Mild intermittent asthma, uncomplicated: Secondary | ICD-10-CM

## 2016-05-19 ENCOUNTER — Other Ambulatory Visit: Payer: Self-pay | Admitting: Family Medicine

## 2016-07-14 ENCOUNTER — Ambulatory Visit (INDEPENDENT_AMBULATORY_CARE_PROVIDER_SITE_OTHER): Payer: Managed Care, Other (non HMO) | Admitting: Family Medicine

## 2016-07-14 ENCOUNTER — Encounter: Payer: Self-pay | Admitting: Family Medicine

## 2016-07-14 VITALS — BP 116/88 | HR 75 | Temp 97.7°F | Ht 60.75 in | Wt 224.8 lb

## 2016-07-14 DIAGNOSIS — E7800 Pure hypercholesterolemia, unspecified: Secondary | ICD-10-CM | POA: Diagnosis not present

## 2016-07-14 DIAGNOSIS — I10 Essential (primary) hypertension: Secondary | ICD-10-CM | POA: Diagnosis not present

## 2016-07-14 DIAGNOSIS — E119 Type 2 diabetes mellitus without complications: Secondary | ICD-10-CM

## 2016-07-14 DIAGNOSIS — Z0000 Encounter for general adult medical examination without abnormal findings: Secondary | ICD-10-CM | POA: Diagnosis not present

## 2016-07-14 DIAGNOSIS — Z794 Long term (current) use of insulin: Secondary | ICD-10-CM

## 2016-07-14 DIAGNOSIS — G43009 Migraine without aura, not intractable, without status migrainosus: Secondary | ICD-10-CM | POA: Diagnosis not present

## 2016-07-14 DIAGNOSIS — J309 Allergic rhinitis, unspecified: Secondary | ICD-10-CM

## 2016-07-14 LAB — POCT UA - MICROALBUMIN: Microalbumin Ur, POC: 50 mg/L

## 2016-07-14 NOTE — Progress Notes (Signed)
Patient: Kelly Matthews, Female    DOB: [birth year], 50 y.o.   MRN: [id number] Visit Date: 07/14/2016  Today's Provider: [name], PA   Chief Complaint  Patient presents with  . Annual Exam   Subjective:    Annual physical exam Kelly Matthews is a 50 y.o. female who presents today for health maintenance and complete physical. She feels well. She reports exercising walking daily at work. She reports she is sleeping well (average 6-8 hours per night).  ----------------------------------------------------------------- Tdap: 01/12/2011  Review of Systems  Constitutional: Negative.   HENT: Negative.   Eyes: Negative.   Respiratory: Negative.   Cardiovascular: Negative.   Gastrointestinal: Positive for diarrhea.  Endocrine: Negative.   Genitourinary: Negative.   Musculoskeletal: Negative.   Skin: Negative.   Allergic/Immunologic: Negative.   Neurological: Negative.   Hematological: Negative.   Psychiatric/Behavioral: Negative.     Social History      She  reports that she has quit smoking. She quit after 1.00 year of use. She has never used smokeless tobacco. She reports that she drinks alcohol. She reports that she does not use drugs.       Social History   Social History  . Marital status: Single    Spouse name: N/A  . Number of children: N/A  . Years of education: N/A   Social History Main Topics  . Smoking status: Former Smoker    Years: 1.00  . Smokeless tobacco: Never Used  . Alcohol use 0.0 oz/week     Comment: occasionally  . Drug use: No  . Sexual activity: Not Asked   Other Topics Concern  . None   Social History Narrative  . None   Past Medical History:  Diagnosis Date  . Diabetes mellitus without complication ([place])   . Hypertension    Patient Active Problem List   Diagnosis Date Noted  . Migraine headache 05/05/2016  . Allergic rhinitis 07/23/2015  . Excess, menstruation 07/19/2015  . 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 Blood glucose elevated 07/19/2015  . Airway hyperreactivity 07/19/2015  . Disorder of esophagus 07/19/2015  . Hypercholesterolemia without hypertriglyceridemia 07/19/2015  . Borderline diabetes 07/19/2015  . Diabetes ([place]) 11/28/2014  . Essential (primary) hypertension 11/28/2014  . Aortic heart valve narrowing 11/28/2014  . 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 Supraventricular tachycardia ([place]) 11/28/2014  . 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 BREAST BIOPSY Left 04/18/2015   Benign calcification  . CESAREAN SECTION  1990,2001  . INTRAUTERINE DEVICE INSERTION  October 2015   [facility]    Family History        Family Status  Relation Status  . Mother Alive  . Father Deceased  . Daughter Alive  . Maternal Grandmother Deceased  . Sister Alive  . Brother Alive  . Son Alive  . Maternal Grandfather Deceased  . Paternal Grandmother Deceased  . Paternal Grandfather Deceased  . Brother Deceased  . Neg Hx         Her family history includes Cancer in her maternal grandmother; Coronary artery disease in her father and mother; Diabetes in her father; Emphysema in her father; Seizures in her daughter.    No Known Allergies  Current Meds  Medication Sig  . amitriptyline (ELAVIL) 10 MG tablet TAKE 1 TABLET (10 MG TOTAL) BY MOUTH 3 (THREE) TIMES DAILY.  . budesonide (PULMICORT FLEXHALER) 180  MCG/ACT inhaler   . diltiazem (DILACOR XR) 120 MG 24 hr capsule 1 capsule daily.  . fluticasone (FLONASE) 50 MCG/ACT nasal spray SPRAY 2 SPRAYS IN EACH NOSTRIL DAILY  . metFORMIN (GLUCOPHAGE) 500 MG tablet Take 1 tablet (500 mg total) by mouth 2 (two) times daily.  . montelukast (SINGULAIR) 10 MG tablet Take 1 tablet (10 mg total) by mouth daily.  . ondansetron (ZOFRAN) 4 MG tablet Take 1 tablet (4 mg total) by mouth every 6 (six) hours as needed for nausea or vomiting.  . ONE  TOUCH ULTRA TEST test strip USE 1 STRIP TWICE DAILY AS NEEDED  . ONETOUCH DELICA LANCETS 99991111 MISC USE TO CHECK BLOOD SUGAR TWICE A DAY  . SUMAtriptan (IMITREX) 50 MG tablet Take one tablet by mouth at onset of migraine. May repeat in 2 hours if headache persists or recurs. Limit 2 doses in 24 hours.    Patient Care Team: Margo Common, PA as PCP - General (Physician Assistant)     Objective:   Vitals: BP 116/88 (BP Location: Right Arm, Patient Position: Sitting, Cuff Size: Normal)   Pulse 75   Temp 97.7 F (36.5 C) (Oral)   Ht 5' 0.75" (1.543 m)   Wt 224 lb 12.8 oz (102 kg)   BMI 42.83 kg/m   Wt Readings from Last 3 Encounters:  07/14/16 224 lb 12.8 oz (102 kg)  05/05/16 217 lb (98.4 kg)  04/24/16 222 lb (100.7 kg)   Physical Exam  Constitutional: She is oriented to person, place, and time. She appears well-developed and well-nourished.  HENT:  Head: Normocephalic and atraumatic.  Right Ear: External ear normal.  Left Ear: External ear normal.  Nose: Nose normal.  Mouth/Throat: Oropharynx is clear and moist.  Eyes: Conjunctivae and EOM are normal. Pupils are equal, round, and reactive to light. Right eye exhibits no discharge.  Neck: Normal range of motion. Neck supple. No tracheal deviation present. No thyromegaly present.  Cardiovascular: Normal rate, regular rhythm, normal heart sounds and intact distal pulses.   No murmur heard. Pulmonary/Chest: Effort normal and breath sounds normal. No respiratory distress. She has no wheezes. She has no rales. She exhibits no tenderness.  Abdominal: Soft. She exhibits no distension and no mass. There is no tenderness. There is no rebound and no guarding.  Genitourinary:  Genitourinary Comments: Pelvic and breast exam deferred to GYN. PAP normal Mar. 2017 and mammograms normal 04-06-16.  Musculoskeletal: Normal range of motion. She exhibits no edema or tenderness.  Lymphadenopathy:    She has no cervical adenopathy.  Neurological:  She is alert and oriented to person, place, and time. She has normal reflexes. No cranial nerve deficit. She exhibits normal muscle tone. Coordination normal.  Skin: Skin is warm and dry. No rash noted. No erythema.  Psychiatric: She has a normal mood and affect. Her behavior is normal. Judgment and thought content normal.    Depression Screen PHQ 2/9 Scores 05/31/2015  PHQ - 2 Score 1   Assessment & Plan:     Routine Health Maintenance and Physical Exam  Exercise Activities and Dietary recommendations Goals    Continue daily walking for exercise and diabetic diet.      Immunization History  Administered Date(s) Administered  . Hepatitis A 08/31/2012, 03/01/2013  . Hepatitis B 08/31/2012, 11/01/2012, 03/01/2013  . Pneumococcal Polysaccharide-23 05/01/2014  . Tdap 01/12/2011    Health Maintenance  Topic Date Due  . HIV Screening  02/11/1982  . FOOT EXAM  05/30/2016  .  INFLUENZA VACCINE  06/16/2016  . OPHTHALMOLOGY EXAM  11/25/2016  . HEMOGLOBIN A1C  01/14/2017  . URINE MICROALBUMIN  07/14/2017  . PAP SMEAR  01/28/2019  . PNEUMOCOCCAL POLYSACCHARIDE VACCINE (2) 05/02/2019  . TETANUS/TDAP  01/12/2021      Discussed health benefits of physical activity, and encouraged her to engage in regular exercise appropriate for her age and condition.    -------------------------------------------------------------------- 1. Annual physical exam Good general health. Gets annual follow up exam with GYN (PAP normal March 2017, mammogram 04-06-16). Gets flu shot at work every October. Last tetanus was on 01-12-11. Given anticipatory guidance.  2. Essential (primary) hypertension Stable control with use of Diltazem and Lisinopril. Recheck labs and continue annual follow up with cardiologist (Dr. Nehemiah Massed) with history of mild aortic valve stenosis and SVT.  - Lipid panel - CBC with Differential/Platelet  3. Migraine without aura and without status migrainosus, not  intractable Infrequent use of Imitrex with good control. Feeling well today with use of Amitriptyline to prevent migraines..  4. Allergic rhinitis, unspecified allergic rhinitis type Stable and well controlled with use of OTC antihistamine prn, Singulair and Flonase. Recheck prn.  5. Hypercholesterolemia without hypertriglyceridemia Trying to lower levels with diabetic diet. May need statin due to diabetes risk. Will recheck lipids. - Lipid panel - TSH - Comprehensive metabolic panel  6. Type 2 diabetes mellitus without complication, with long-term current use of insulin (HCC) FBS 169 this morning and 119 at lunch. Tolerating Invokana 100 mg qd and Metformin 500 mg BID. Follow diabetic diet and recommend podiatry screening. Continue annual ophthalmology evaluation. Recheck labs and urine microalbumin. Follow up pending reports. - Lipid panel - Hemoglobin A1c - POCT UA - Microalbumin - CBC with Differential/Platelet - Comprehensive metabolic panel - Ambulatory referral to [facility], [place]

## 2016-07-18 LAB — LIPID PANEL
CHOL/HDL RATIO: 4.4 ratio (ref 0.0–4.4)
Cholesterol, Total: 194 mg/dL (ref 100–199)
HDL: 44 mg/dL (ref >39)
LDL CALC: 135 mg/dL — AB (ref 0–99)
TRIGLYCERIDES: 75 mg/dL (ref 0–149)
VLDL Cholesterol Cal: 15 mg/dL (ref 5–40)

## 2016-07-18 LAB — COMPREHENSIVE METABOLIC PANEL
ALBUMIN: 4.6 g/dL (ref 3.5–5.5)
ALK PHOS: 94 IU/L (ref 39–117)
ALT: 93 IU/L — ABNORMAL HIGH (ref 0–32)
AST: 52 IU/L — ABNORMAL HIGH (ref 0–40)
Albumin/Globulin Ratio: 1.9 (ref 1.2–2.2)
BILIRUBIN TOTAL: 0.5 mg/dL (ref 0.0–1.2)
BUN / CREAT RATIO: 29 — AB (ref 9–23)
BUN: 14 mg/dL (ref 6–24)
CHLORIDE: 98 mmol/L (ref 96–106)
CO2: 24 mmol/L (ref 18–29)
Calcium: 9.3 mg/dL (ref 8.7–10.2)
Creatinine, Ser: 0.49 mg/dL — ABNORMAL LOW (ref 0.57–1.00)
GFR calc Af Amer: 132 mL/min/{1.73_m2} (ref >59)
GFR calc non Af Amer: 115 mL/min/{1.73_m2} (ref >59)
GLOBULIN, TOTAL: 2.4 g/dL (ref 1.5–4.5)
Glucose: 159 mg/dL — ABNORMAL HIGH (ref 65–99)
Potassium: 4.2 mmol/L (ref 3.5–5.2)
SODIUM: 140 mmol/L (ref 134–144)
Total Protein: 7 g/dL (ref 6.0–8.5)

## 2016-07-18 LAB — CBC WITH DIFFERENTIAL/PLATELET
Basophils Absolute: 0 10*3/uL (ref 0.0–0.2)
Basos: 0 %
EOS (ABSOLUTE): 0.1 10*3/uL (ref 0.0–0.4)
EOS: 1 %
HEMATOCRIT: 40.3 % (ref 34.0–46.6)
HEMOGLOBIN: 13.6 g/dL (ref 11.1–15.9)
Immature Grans (Abs): 0 10*3/uL (ref 0.0–0.1)
Immature Granulocytes: 0 %
LYMPHS ABS: 2.4 10*3/uL (ref 0.7–3.1)
Lymphs: 22 %
MCH: 28.7 pg (ref 26.6–33.0)
MCHC: 33.7 g/dL (ref 31.5–35.7)
MCV: 85 fL (ref 79–97)
MONOCYTES: 5 %
Monocytes Absolute: 0.5 10*3/uL (ref 0.1–0.9)
NEUTROS ABS: 8 10*3/uL — AB (ref 1.4–7.0)
Neutrophils: 72 %
Platelets: 152 10*3/uL (ref 150–379)
RBC: 4.74 x10E6/uL (ref 3.77–5.28)
RDW: 13.5 % (ref 12.3–15.4)
WBC: 11 10*3/uL — ABNORMAL HIGH (ref 3.4–10.8)

## 2016-07-18 LAB — TSH: TSH: 3.59 u[IU]/mL (ref 0.450–4.500)

## 2016-07-18 LAB — HEMOGLOBIN A1C
Est. average glucose Bld gHb Est-mCnc: 146 mg/dL
Hgb A1c MFr Bld: 6.7 % — ABNORMAL HIGH (ref 4.8–5.6)

## 2016-07-21 ENCOUNTER — Telehealth: Payer: Self-pay

## 2016-07-21 NOTE — Telephone Encounter (Signed)
Patient advised.

## 2016-07-21 NOTE — Telephone Encounter (Signed)
-----   Message from Margo Common, [place] sent at 07/21/2016 12:14 AM EDT ----- LDL cholesterol high. Must work on weight loss by restricting fats in diet and exercising 30 minutes 3-4 days a week. Diabetes in good control with Metformin. Hgb A1C lower than last check. Schedule follow up appointment in 3 months.

## 2016-07-23 ENCOUNTER — Encounter: Payer: Self-pay | Admitting: Podiatry

## 2016-07-23 ENCOUNTER — Ambulatory Visit (INDEPENDENT_AMBULATORY_CARE_PROVIDER_SITE_OTHER): Payer: Managed Care, Other (non HMO) | Admitting: Podiatry

## 2016-07-23 DIAGNOSIS — M722 Plantar fascial fibromatosis: Secondary | ICD-10-CM

## 2016-07-23 DIAGNOSIS — L84 Corns and callosities: Secondary | ICD-10-CM

## 2016-07-23 DIAGNOSIS — M79673 Pain in unspecified foot: Secondary | ICD-10-CM | POA: Diagnosis not present

## 2016-07-23 DIAGNOSIS — M2010 Hallux valgus (acquired), unspecified foot: Secondary | ICD-10-CM

## 2016-07-23 DIAGNOSIS — M216X2 Other acquired deformities of left foot: Secondary | ICD-10-CM

## 2016-07-23 DIAGNOSIS — M2142 Flat foot [pes planus] (acquired), left foot: Secondary | ICD-10-CM

## 2016-07-23 DIAGNOSIS — R29898 Other symptoms and signs involving the musculoskeletal system: Secondary | ICD-10-CM

## 2016-07-23 DIAGNOSIS — E119 Type 2 diabetes mellitus without complications: Secondary | ICD-10-CM | POA: Diagnosis not present

## 2016-07-23 DIAGNOSIS — M7750 Other enthesopathy of unspecified foot: Secondary | ICD-10-CM

## 2016-07-23 DIAGNOSIS — M21619 Bunion of unspecified foot: Secondary | ICD-10-CM

## 2016-07-23 DIAGNOSIS — M216X1 Other acquired deformities of right foot: Secondary | ICD-10-CM

## 2016-07-23 DIAGNOSIS — M2141 Flat foot [pes planus] (acquired), right foot: Secondary | ICD-10-CM

## 2016-07-23 NOTE — Progress Notes (Signed)
   Subjective:    Patient ID: Kelly Matthews, female    DOB: [birth year], 50 y.o.   MRN: [id number]  HPI I went to my medical doctor and because I have diabetes they wanted me to go and have my feet checked and I have some calluses that need to be trimmed up and I do not have any numbness or tingling, no soreness or tenderness and I do work a lot on my feet    Review of Systems  Skin:       Psoriasis   All other systems reviewed and are negative.      Objective:   Physical Exam        Assessment & Plan:

## 2016-07-23 NOTE — Patient Instructions (Signed)
Corns and Calluses Corns are small areas of thickened skin that occur on the top, sides, or tip of a toe. They contain a cone-shaped core with a point that can press on a nerve below. This causes pain. Calluses are areas of thickened skin that can occur anywhere on the body including hands, fingers, palms, soles of the feet, and heels.Calluses are usually larger than corns.  CAUSES  Corns and calluses are caused by rubbing (friction) or pressure, such as from shoes that are too tight or do not fit properly.  RISK FACTORS Corns are more likely to develop in people who have toe deformities, such as hammer toes. Since calluses can occur with friction to any area of the skin, calluses are more likely to develop in people who:   Work with their hands.  Wear shoes that fit poorly, shoes that are too tight, or shoes that are high-heeled.  Have toes deformities. SYMPTOMS Symptoms of a corn or callus include:  A hard growth on the skin.   Pain or tenderness under the skin.   Redness and swelling.   Increased discomfort while wearing tight-fitting shoes. DIAGNOSIS  Corns and calluses may be diagnosed with a medical history and physical exam.  TREATMENT  Corns and calluses may be treated with:  Removing the cause of the friction or pressure. This may include:  Changing your shoes.  Wearing shoe inserts (orthotics) or other protective layers in your shoes, such as a corn pad.  Wearing gloves.  Medicines to help soften skin in the hardened, thickened areas.  Reducing the size of the corn or callus by removing the dead layers of skin.  Antibiotic medicines to treat infection.  Surgery, if a toe deformity is the cause. HOME CARE INSTRUCTIONS   Take medicines only as directed by your health care provider.  If you were prescribed an antibiotic, finish all of it even if you start to feel better.  Wear shoes that fit well. Avoid wearing high-heeled shoes and shoes that are too tight  or too loose.  Wear any padding, protective layers, gloves, or orthotics as directed by your health care provider.  Soak your hands or feet and then use a file or pumice stone to soften your corn or callus. Do this as directed by your health care provider.  Check your corn or callus every day for signs of infection. Watch for:  Redness, swelling, or pain.  Fluid, blood, or pus. SEEK MEDICAL CARE IF:   Your symptoms do not improve with treatment.  You have increased redness, swelling, or pain at the site of your corn or callus.  You have fluid, blood, or pus coming from your corn or callus.  You have new symptoms.   This information is not intended to replace advice given to you by your health care provider. Make sure you discuss any questions you have with your health care provider.   Document Released: 08/08/2004 Document Revised: 03/19/2015 Document Reviewed: 10/29/2014 Elsevier Interactive Patient Education 2016 [employer]vier Inc[employer]  

## 2016-07-23 NOTE — Progress Notes (Addendum)
Patient ID: Kelly Matthews, female   DOB: 1965/12/17, 50 y.o.   MRN: [id number] Subjective:  Patient comes in today for routine evaluation. patient's been diagnosed with diabetes mellitus for the past 2 years she is referred by her primary care doctor. Patient also has a painful callus lesion to her right big toe. Patient states that she's worked at [employer] for the past 11 years here in [place] and she is on her feet all day long. By the end of the day her feet are significantly painful and achy. No other complaints at this time  Objective: Physical Exam General: The patient is alert and oriented x3 in no acute distress.  Dermatology: Painful hyperkeratotic lesion about the right hallux plantar medially. Painful on palpation. Skin is warm, dry and supple bilateral lower extremities. Negative for open lesions or macerations.  Vascular: Palpable pedal pulses bilaterally. No edema or erythema noted. Capillary refill within normal limits.  Neurological: Epicritic and protective threshold grossly intact bilaterally. Monofilament test results are satisfactory for light touch sensation   Musculoskeletal Exam: Range of motion within normal limits to all pedal and ankle joints bilateral. Muscle strength 5/5 in all groups bilateral.    Assessment: #1 diabetes mellitus without complication. 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       Plan of Care:  #1 Patient was evaluated. #2 Full comprehensive exam was performed of the lower extremities. #3 discussed with the patient the options of custom orthotics to alleviate her symptomatically since she is diabetic  and works on her feet all day long. #4 excisional debridement of callus lesions to the right great toe was performed using a chisel blade #5 patient is to return to clinic on a when necessary basis     Dr. Edrick Kins, [place]

## 2016-08-19 ENCOUNTER — Ambulatory Visit (INDEPENDENT_AMBULATORY_CARE_PROVIDER_SITE_OTHER): Payer: Managed Care, Other (non HMO) | Admitting: *Deleted

## 2016-08-19 DIAGNOSIS — M722 Plantar fascial fibromatosis: Secondary | ICD-10-CM

## 2016-08-19 DIAGNOSIS — E119 Type 2 diabetes mellitus without complications: Secondary | ICD-10-CM | POA: Diagnosis not present

## 2016-08-21 NOTE — Progress Notes (Signed)
Betha casted pt for orthotics. Pt to follow up for dispensing in 4 weeks.

## 2016-08-26 ENCOUNTER — Encounter: Payer: Self-pay | Admitting: Physician Assistant

## 2016-08-26 ENCOUNTER — Ambulatory Visit (INDEPENDENT_AMBULATORY_CARE_PROVIDER_SITE_OTHER): Payer: Managed Care, Other (non HMO) | Admitting: Physician Assistant

## 2016-08-26 VITALS — BP 138/96 | HR 84 | Temp 98.1°F | Resp 16 | Wt 229.2 lb

## 2016-08-26 DIAGNOSIS — M5412 Radiculopathy, cervical region: Secondary | ICD-10-CM

## 2016-08-26 MED ORDER — METHOCARBAMOL 500 MG PO TABS: 500.0000 mg | ORAL_TABLET | Freq: Every day | ORAL | 0 refills | Status: DC

## 2016-08-26 MED ORDER — PREDNISONE 20 MG PO TABS: ORAL_TABLET | ORAL | 0 refills | Status: DC

## 2016-08-26 NOTE — Progress Notes (Signed)
Patient: Kelly Matthews Female    DOB: 03/21/1966   50 y.o.   MRN: [id number] Visit Date: 08/26/2016  Today's Provider: Trinna Post, PA-C   Chief Complaint  Patient presents with  . Back Pain   Subjective:    Back Pain  This is a new problem. The current episode started 1 to 4 weeks ago. The problem occurs constantly. The problem is unchanged. Pain location: upper back. The quality of the pain is described as aching. Worse during: worse when standing. The symptoms are aggravated by standing. (Pins and needles sensation in left hand) She has tried NSAIDs for the symptoms. The treatment provided no relief.   Patient is a 50 y/o female with hx of well controlled DM on metformin presenting today with numbness and tingling in her left hand ongoing for 3-4 days. She reports some cervical neck pain as well. Localizes her numbness and tingling to her whole left hand, not specific fingers. Denies numbness and tingling elsewhere. No problems with her right hand. No issues with ROM in neck.    Previous Medications   AMITRIPTYLINE (ELAVIL) 10 MG TABLET    TAKE 1 TABLET (10 MG TOTAL) BY MOUTH 3 (THREE) TIMES DAILY.   BUDESONIDE (PULMICORT FLEXHALER) 180 MCG/ACT INHALER       DILTIAZEM (DILACOR XR) 120 MG 24 HR CAPSULE    1 capsule daily.   FLUTICASONE (FLONASE) 50 MCG/ACT NASAL SPRAY    SPRAY 2 SPRAYS IN EACH NOSTRIL DAILY   HYDROCHLOROTHIAZIDE (MICROZIDE) 12.5 MG CAPSULE    1 capsule daily.   METFORMIN (GLUCOPHAGE) 500 MG TABLET    Take 1 tablet (500 mg total) by mouth 2 (two) times daily.   MONTELUKAST (SINGULAIR) 10 MG TABLET    Take 1 tablet (10 mg total) by mouth daily.   ONDANSETRON (ZOFRAN) 4 MG TABLET    Take 1 tablet (4 mg total) by mouth every 6 (six) hours as needed for nausea or vomiting.   ONE TOUCH ULTRA TEST TEST STRIP    USE 1 STRIP TWICE DAILY AS NEEDED   ONETOUCH DELICA LANCETS 99991111 MISC    USE TO CHECK BLOOD SUGAR TWICE A DAY   SUMATRIPTAN (IMITREX) 50 MG TABLET     Take one tablet by mouth at onset of migraine. May repeat in 2 hours if headache persists or recurs. Limit 2 doses in 24 hours.    Review of Systems  Constitutional: Negative.   Respiratory: Negative.   Cardiovascular: Negative.   Musculoskeletal: Positive for back pain.  Neurological:       "pins and needles" sensation in left hand    Social History  Substance Use Topics  . Smoking status: Former Smoker    Years: 1.00  . Smokeless tobacco: Never Used  . Alcohol use 0.0 oz/week     Comment: occasionally   Objective:   BP (!) 138/96 (BP Location: Right Arm, Patient Position: Sitting, Cuff Size: Large)   Pulse 84   Temp 98.1 F (36.7 C) (Oral)   Resp 16   Wt 229 lb 3.2 oz (104 kg)   BMI 43.66 kg/m   Physical Exam  Constitutional: She is oriented to person, place, and time. She appears well-developed and well-nourished.  Neck: Normal range of motion. Neck supple.  Musculoskeletal: Normal range of motion. She exhibits no edema, tenderness or deformity.       Right elbow: Normal.      Left elbow: Normal.       Right wrist:  Normal.       Left wrist: Normal.       Cervical back: She exhibits pain. She exhibits normal range of motion, no swelling, no edema and no deformity.       Back:       Right hand: Normal.       Left hand: She exhibits normal range of motion, no tenderness, normal capillary refill, no deformity, no laceration and no swelling. Normal sensation noted. Normal strength noted.       Hands: Neurological: She is alert and oriented to person, place, and time. She displays normal reflexes. Coordination normal.  Skin: Skin is warm and dry.        Assessment & Plan:      Problem List Items Addressed This Visit    None    Visit Diagnoses    Cervical radiculopathy    -  Primary   Relevant Medications   predniSONE (DELTASONE) 20 MG tablet   methocarbamol (ROBAXIN) 500 MG tablet     Patient presenting with cervical radiculopathy. Will give 5 day course of  steroids. Patient counseled this may make her blood sugars go up, though she is well controlled, last A1C 6.6. May be some MSK dysfunction, patient has had success with Robaxin in past. Gave Rx, cautioned take only at night, no driving or other sedating substances. After steroid course is through, patient can relieve pain with Tylenol (3g) preferred due to Dm. She may try ibuprofen 600mg  TID as needed. Advised not to take steroids and NSAIDs together, and to take RX dose sparingly. Patient counseled about imaging, and how cervical XRAY may support but not confirm diagnosis. MRI needed for definitive dx. Patient voices understanding, will try these conservative measures and return if not improved.   Return if symptoms worsen or fail to improve.  The entirety of the information documented in the History of Present Illness, Review of Systems and Physical Exam were personally obtained by me. Portions of this information were initially documented by Ashley Royalty, CMA and reviewed by me for thoroughness and accuracy.    Patient Instructions  Do not take corticosteroids at same time as ibuprofen   Cervical Radiculopathy Cervical radiculopathy happens when a nerve in the neck (cervical nerve) is pinched or bruised. This condition can develop because of an injury or as part of the normal aging process. Pressure on the cervical nerves can cause pain or numbness that runs from the neck all the way down into the arm and fingers. Usually, this condition gets better with rest. Treatment may be needed if the condition does not improve.  CAUSES This condition may be caused by:  Injury.  Slipped (herniated) disk.  Muscle tightness in the neck because of overuse.  Arthritis.  Breakdown or degeneration in the bones and joints of the spine (spondylosis) due to aging.  Bone spurs that may develop near the cervical nerves. SYMPTOMS Symptoms of this condition include:  Pain that runs from the neck to the arm  and hand. The pain can be severe or irritating. It may be worse when the neck is moved.  Numbness or weakness in the affected arm and hand. DIAGNOSIS This condition may be diagnosed based on symptoms, medical history, and a physical exam. You may also have tests, including:  X-rays.  CT scan.  MRI.  Electromyogram (EMG).  Nerve conduction tests. TREATMENT In many cases, treatment is not needed for this condition. With rest, the condition usually gets better over time. If treatment  is needed, options may include:  Wearing a soft neck collar for short periods of time.  Physical therapy to strengthen your neck muscles.  Medicines, such as NSAIDs, oral corticosteroids, or spinal injections.  Surgery. This may be needed if other treatments do not help. Various types of surgery may be done depending on the cause of your problems. HOME CARE INSTRUCTIONS Managing Pain  Take over-the-counter and prescription medicines only as told by your health care provider.  If directed, apply ice to the affected area.  Put ice in a plastic bag.  Place a towel between your skin and the bag.  Leave the ice on for 20 minutes, 2-3 times per day.  If ice does not help, you can try using heat. Take a warm shower or warm bath, or use a heat pack as told by your health care provider.  Try a gentle neck and shoulder massage to help relieve symptoms. Activity  Rest as needed. Follow instructions from your health care provider about any restrictions on activities.  Do stretching and strengthening exercises as told by your health care provider or physical therapist. General Instructions  If you were given a soft collar, wear it as told by your health care provider.  Use a flat pillow when you sleep.  Keep all follow-up visits as told by your health care provider. This is important. SEEK MEDICAL CARE IF:  Your condition does not improve with treatment. SEEK IMMEDIATE MEDICAL CARE IF:  Your  pain gets much worse and cannot be controlled with medicines.  You have weakness or numbness in your hand, arm, face, or leg.  You have a high fever.  You have a stiff, rigid neck.  You lose control of your bowels or your bladder (have incontinence).  You have trouble with walking, balance, or speaking.   This information is not intended to replace advice given to you by your health care provider. Make sure you discuss any questions you have with your health care provider.   Document Released: 07/28/2001 Document Revised: 07/24/2015 Document Reviewed: 12/27/2014 Elsevier Interactive Patient Education 2016 [employer].      Follow up: No Follow-up on file.

## 2016-08-26 NOTE — Patient Instructions (Addendum)
Do not take corticosteroids at same time as ibuprofen   Cervical Radiculopathy Cervical radiculopathy happens when a nerve in the neck (cervical nerve) is pinched or bruised. This condition can develop because of an injury or as part of the normal aging process. Pressure on the cervical nerves can cause pain or numbness that runs from the neck all the way down into the arm and fingers. Usually, this condition gets better with rest. Treatment may be needed if the condition does not improve.  CAUSES This condition may be caused by:  Injury.  Slipped (herniated) disk.  Muscle tightness in the neck because of overuse.  Arthritis.  Breakdown or degeneration in the bones and joints of the spine (spondylosis) due to aging.  Bone spurs that may develop near the cervical nerves. SYMPTOMS Symptoms of this condition include:  Pain that runs from the neck to the arm and hand. The pain can be severe or irritating. It may be worse when the neck is moved.  Numbness or weakness in the affected arm and hand. DIAGNOSIS This condition may be diagnosed based on symptoms, medical history, and a physical exam. You may also have tests, including:  X-rays.  CT scan.  MRI.  Electromyogram (EMG).  Nerve conduction tests. TREATMENT In many cases, treatment is not needed for this condition. With rest, the condition usually gets better over time. If treatment is needed, options may include:  Wearing a soft neck collar for short periods of time.  Physical therapy to strengthen your neck muscles.  Medicines, such as NSAIDs, oral corticosteroids, or spinal injections.  Surgery. This may be needed if other treatments do not help. Various types of surgery may be done depending on the cause of your problems. HOME CARE INSTRUCTIONS Managing Pain  Take over-the-counter and prescription medicines only as told by your health care provider.  If directed, apply ice to the affected area.  Put ice in a  plastic bag.  Place a towel between your skin and the bag.  Leave the ice on for 20 minutes, 2-3 times per day.  If ice does not help, you can try using heat. Take a warm shower or warm bath, or use a heat pack as told by your health care provider.  Try a gentle neck and shoulder massage to help relieve symptoms. Activity  Rest as needed. Follow instructions from your health care provider about any restrictions on activities.  Do stretching and strengthening exercises as told by your health care provider or physical therapist. General Instructions  If you were given a soft collar, wear it as told by your health care provider.  Use a flat pillow when you sleep.  Keep all follow-up visits as told by your health care provider. This is important. SEEK MEDICAL CARE IF:  Your condition does not improve with treatment. SEEK IMMEDIATE MEDICAL CARE IF:  Your pain gets much worse and cannot be controlled with medicines.  You have weakness or numbness in your hand, arm, face, or leg.  You have a high fever.  You have a stiff, rigid neck.  You lose control of your bowels or your bladder (have incontinence).  You have trouble with walking, balance, or speaking.   This information is not intended to replace advice given to you by your health care provider. Make sure you discuss any questions you have with your health care provider.   Document Released: 07/28/2001 Document Revised: 07/24/2015 Document Reviewed: 12/27/2014 Elsevier Interactive Patient Education [employer].

## 2016-09-02 ENCOUNTER — Ambulatory Visit
Admission: RE | Admit: 2016-09-02 | Discharge: 2016-09-02 | Disposition: A | Discharge status: Home / Self Care | Payer: Managed Care, Other (non HMO) | Source: Ambulatory Visit | Attending: Physician Assistant | Admitting: Physician Assistant

## 2016-09-02 ENCOUNTER — Other Ambulatory Visit: Payer: Self-pay | Admitting: Physician Assistant

## 2016-09-02 ENCOUNTER — Telehealth: Payer: Self-pay | Admitting: Family Medicine

## 2016-09-02 DIAGNOSIS — M5032 Other cervical disc degeneration, mid-cervical region, unspecified level: Secondary | ICD-10-CM | POA: Insufficient documentation

## 2016-09-02 DIAGNOSIS — M542 Cervicalgia: Secondary | ICD-10-CM | POA: Diagnosis present

## 2016-09-02 DIAGNOSIS — M479 Spondylosis, unspecified: Secondary | ICD-10-CM | POA: Diagnosis not present

## 2016-09-02 MED ORDER — CYCLOBENZAPRINE HCL 5 MG PO TABS
5.0000 mg | ORAL_TABLET | Freq: Every day | ORAL | 0 refills | Status: AC
Start: 2016-09-02 — End: 2016-09-12

## 2016-09-02 NOTE — Telephone Encounter (Signed)
Please see most recent note to advise patient. Thank you.

## 2016-09-02 NOTE — Telephone Encounter (Signed)
Pt was in last week and seen Kelly Matthews for back pain.  She states her back is still hurting and wants to know if something stronger can be sent to the pharmacy.  She uses [facility]  Pts Call back is [phone]  Thanks Con Memos

## 2016-09-02 NOTE — Telephone Encounter (Signed)
Pt advised.   Thanks,   -Cayla Wiegand  

## 2016-09-02 NOTE — Progress Notes (Signed)
Patient seen with neck pain last week, treated for some possible radiculopathy with steroid and muscle relaxer. Patient calling back asking for something stronger to be called in to pharmacy. Have ordered C-spine XRAY for patient and referred patient to physical therapy. Patient may use up to 3g Tylenol per day. Will switch robaxin to flexeril to see if this gives improvement, use one pill at bedtime. Do not drive with this medication. Possible ortho or physiatry referral based on imaging.

## 2016-09-02 NOTE — Progress Notes (Signed)
Please advise patient. Patient has degenerative changes on C-spine x-ray that may be responsible for her neck and arm pain. Non surgical intervention would include referral to physiatry if patient is willing to undergo injections. Thank you.

## 2016-09-03 ENCOUNTER — Telehealth: Payer: Self-pay

## 2016-09-03 NOTE — Telephone Encounter (Signed)
LMTCB 09/03/2016  Thanks,   -Mickel Baas

## 2016-09-03 NOTE — Telephone Encounter (Signed)
-----   Message from Trinna Post, [place] sent at 09/02/2016  3:43 PM EDT ----- Patient with degenerative changes on C-spine x-ray that may be cause of patient's neck pain, arm pain, and numbness. Will refer to physiatry. This is non-surgical option for management of this condition.

## 2016-09-04 NOTE — Telephone Encounter (Signed)
Pt advised.   Thanks,   -Kasidy Gianino  

## 2016-09-10 ENCOUNTER — Telehealth: Payer: Self-pay | Admitting: Family Medicine

## 2016-09-10 ENCOUNTER — Other Ambulatory Visit: Payer: Self-pay | Admitting: Physician Assistant

## 2016-09-10 DIAGNOSIS — M542 Cervicalgia: Secondary | ICD-10-CM

## 2016-09-10 MED ORDER — HYDROCODONE-ACETAMINOPHEN 5-325 MG PO TABS: 1.0000 | ORAL_TABLET | Freq: Two times a day (BID) | ORAL | 0 refills | Status: AC | PRN

## 2016-09-10 NOTE — Progress Notes (Signed)
Spoke with patient on phone this morning. Seen in clinic on 08/26/2016 for some cervical radiculopathy. C-spine x-ray showed some degenerative changes. Steroid and muscle relaxers did not provide relief. High dose OTC medication has not provided relief. Patient reporting pain is now sharp, keeping her awake from sleep and interfering with her work. Meloxicam has high bleed risk with amyitriptyline and risk of kidney damage in setting of Type II Dm. Tramadol increases risk of serotonin syndrome with amitriptyline. Will give 5/325 hydrocodone/acetaminophen to get her to physiatry appointment on Monday, 09/14/2016. Instructed patient not to drive as these are sedating. Patient may pick up prescription in office. Instructed patient to call back with any problems.

## 2016-09-10 NOTE — Telephone Encounter (Signed)
Pt states she seen Adriana for back pain.  Pt is requesting  Rx to help with the pain.  [facility]  [phone]

## 2016-09-11 ENCOUNTER — Encounter: Payer: Self-pay | Admitting: *Deleted

## 2016-09-16 ENCOUNTER — Other Ambulatory Visit: Payer: Managed Care, Other (non HMO)

## 2016-09-28 ENCOUNTER — Telehealth: Payer: Self-pay

## 2016-09-28 NOTE — Telephone Encounter (Signed)
Patient called saying that she was seen at Dr. Sharlet Salina for neck and back pain and she was prescribed gabapentin and prednisone. She reports that since she has started these medications, her blood sugars have been out of control. Patient reports that her blood sugar was over 500 2 days ago, and reports that later that evening it was around 349. She reports that her fasting blood sugar today was 185. After eating, she reports that her blood sugar was 308. Patient denies any blurred vision, dizziness, numbness or tingling in her extremities. She does feel a little more tired than usual, but has no other symptoms/complaints. Patient wants to know what to do about her blood sugars?  Advised Dennis Chrismon, PA as above, and he reports that the patient needs to take an additional dose of Metformin (500mg ) while she is on the prednisone. Advised patient that she also needs to schedule a follow up visit with Simona Huh to monitor blood sugars to see if they stabilize. Patient verbalized understanding and agrees to treatment plan.

## 2016-10-06 ENCOUNTER — Other Ambulatory Visit: Payer: Self-pay | Admitting: Family Medicine

## 2016-10-30 ENCOUNTER — Other Ambulatory Visit: Payer: Self-pay | Admitting: Family Medicine

## 2016-10-30 DIAGNOSIS — J4520 Mild intermittent asthma, uncomplicated: Secondary | ICD-10-CM

## 2016-11-06 ENCOUNTER — Other Ambulatory Visit: Payer: Self-pay | Admitting: Family Medicine

## 2016-11-06 DIAGNOSIS — E119 Type 2 diabetes mellitus without complications: Secondary | ICD-10-CM

## 2016-11-16 LAB — HM DIABETES EYE EXAM

## 2016-11-18 ENCOUNTER — Other Ambulatory Visit: Payer: Self-pay | Admitting: Physical Medicine and Rehabilitation

## 2016-11-18 DIAGNOSIS — M5412 Radiculopathy, cervical region: Secondary | ICD-10-CM

## 2016-11-25 ENCOUNTER — Ambulatory Visit
Admission: RE | Admit: 2016-11-25 | Discharge: 2016-11-25 | Disposition: A | Discharge status: Home / Self Care | Payer: Managed Care, Other (non HMO) | Source: Ambulatory Visit | Attending: Physical Medicine and Rehabilitation | Admitting: Physical Medicine and Rehabilitation

## 2016-11-25 DIAGNOSIS — M5010 Cervical disc disorder with radiculopathy, unspecified cervical region: Secondary | ICD-10-CM | POA: Insufficient documentation

## 2016-11-25 DIAGNOSIS — M5412 Radiculopathy, cervical region: Secondary | ICD-10-CM | POA: Diagnosis present

## 2016-11-25 DIAGNOSIS — M4802 Spinal stenosis, cervical region: Secondary | ICD-10-CM | POA: Insufficient documentation

## 2016-12-05 ENCOUNTER — Inpatient Hospital Stay
Admission: EM | Admit: 2016-12-05 | Discharge: 2016-12-07 | DRG: 639 | Disposition: A | Discharge status: Home / Self Care | Payer: Managed Care, Other (non HMO) | Attending: Internal Medicine | Admitting: Internal Medicine

## 2016-12-05 ENCOUNTER — Encounter: Payer: Self-pay | Admitting: Emergency Medicine

## 2016-12-05 DIAGNOSIS — Z7984 Long term (current) use of oral hypoglycemic drugs: Secondary | ICD-10-CM | POA: Diagnosis not present

## 2016-12-05 DIAGNOSIS — Z7951 Long term (current) use of inhaled steroids: Secondary | ICD-10-CM | POA: Diagnosis not present

## 2016-12-05 DIAGNOSIS — I10 Essential (primary) hypertension: Secondary | ICD-10-CM | POA: Diagnosis present

## 2016-12-05 DIAGNOSIS — R110 Nausea: Secondary | ICD-10-CM | POA: Diagnosis not present

## 2016-12-05 DIAGNOSIS — E1110 Type 2 diabetes mellitus with ketoacidosis without coma: Secondary | ICD-10-CM | POA: Diagnosis not present

## 2016-12-05 DIAGNOSIS — E440 Moderate protein-calorie malnutrition: Secondary | ICD-10-CM | POA: Insufficient documentation

## 2016-12-05 DIAGNOSIS — K760 Fatty (change of) liver, not elsewhere classified: Secondary | ICD-10-CM | POA: Diagnosis present

## 2016-12-05 DIAGNOSIS — T380X5A Adverse effect of glucocorticoids and synthetic analogues, initial encounter: Secondary | ICD-10-CM | POA: Diagnosis present

## 2016-12-05 DIAGNOSIS — Z87891 Personal history of nicotine dependence: Secondary | ICD-10-CM

## 2016-12-05 DIAGNOSIS — Z23 Encounter for immunization: Secondary | ICD-10-CM | POA: Diagnosis not present

## 2016-12-05 DIAGNOSIS — E785 Hyperlipidemia, unspecified: Secondary | ICD-10-CM | POA: Diagnosis present

## 2016-12-05 DIAGNOSIS — Z79899 Other long term (current) drug therapy: Secondary | ICD-10-CM | POA: Diagnosis not present

## 2016-12-05 DIAGNOSIS — E860 Dehydration: Secondary | ICD-10-CM | POA: Diagnosis present

## 2016-12-05 DIAGNOSIS — Z825 Family history of asthma and other chronic lower respiratory diseases: Secondary | ICD-10-CM | POA: Diagnosis not present

## 2016-12-05 DIAGNOSIS — E876 Hypokalemia: Secondary | ICD-10-CM | POA: Diagnosis present

## 2016-12-05 DIAGNOSIS — D72828 Other elevated white blood cell count: Secondary | ICD-10-CM | POA: Diagnosis present

## 2016-12-05 DIAGNOSIS — Z8249 Family history of ischemic heart disease and other diseases of the circulatory system: Secondary | ICD-10-CM | POA: Diagnosis not present

## 2016-12-05 DIAGNOSIS — Z833 Family history of diabetes mellitus: Secondary | ICD-10-CM | POA: Diagnosis not present

## 2016-12-05 LAB — CBC
HCT: 47.6 % — ABNORMAL HIGH (ref 35.0–47.0)
HEMOGLOBIN: 16.1 g/dL — AB (ref 12.0–16.0)
MCH: 28.1 pg (ref 26.0–34.0)
MCHC: 33.9 g/dL (ref 32.0–36.0)
MCV: 83 fL (ref 80.0–100.0)
PLATELETS: 192 10*3/uL (ref 150–440)
RBC: 5.73 MIL/uL — ABNORMAL HIGH (ref 3.80–5.20)
RDW: 14 % (ref 11.5–14.5)
WBC: 23.1 10*3/uL — ABNORMAL HIGH (ref 3.6–11.0)

## 2016-12-05 LAB — URINALYSIS, COMPLETE (UACMP) WITH MICROSCOPIC
Bacteria, UA: NONE SEEN
Bilirubin Urine: NEGATIVE
Ketones, ur: 80 mg/dL — AB
Leukocytes, UA: NEGATIVE
NITRITE: NEGATIVE
PH: 6 (ref 5.0–8.0)
Protein, ur: 100 mg/dL — AB
SPECIFIC GRAVITY, URINE: 1.029 (ref 1.005–1.030)

## 2016-12-05 LAB — BASIC METABOLIC PANEL
ANION GAP: 19 — AB (ref 5–15)
Anion gap: 17 — ABNORMAL HIGH (ref 5–15)
BUN: 11 mg/dL (ref 6–20)
BUN: 8 mg/dL (ref 6–20)
CALCIUM: 9.1 mg/dL (ref 8.9–10.3)
CHLORIDE: 105 mmol/L (ref 101–111)
CO2: 8 mmol/L — ABNORMAL LOW (ref 22–32)
CO2: 10 mmol/L — ABNORMAL LOW (ref 22–32)
CREATININE: 0.79 mg/dL (ref 0.44–1.00)
Calcium: 7.7 mg/dL — ABNORMAL LOW (ref 8.9–10.3)
Chloride: 100 mmol/L — ABNORMAL LOW (ref 101–111)
Creatinine, Ser: 0.71 mg/dL (ref 0.44–1.00)
GFR calc Af Amer: >60 mL/min (ref >60)
GFR calc non Af Amer: >60 mL/min (ref >60)
Glucose, Bld: 443 mg/dL — ABNORMAL HIGH (ref 65–99)
Glucose, Bld: 330 mg/dL — ABNORMAL HIGH (ref 65–99)
Potassium: 2.7 mmol/L — CL (ref 3.5–5.1)
Potassium: 2.2 mmol/L — CL (ref 3.5–5.1)
SODIUM: 132 mmol/L — AB (ref 135–145)
Sodium: 127 mmol/L — ABNORMAL LOW (ref 135–145)

## 2016-12-05 LAB — BETA-HYDROXYBUTYRIC ACID: Beta-Hydroxybutyric Acid: 5.99 mmol/L — ABNORMAL HIGH (ref 0.05–0.27)

## 2016-12-05 LAB — URINE DRUG SCREEN, QUALITATIVE (ARMC ONLY)
AMPHETAMINES, UR SCREEN: NOT DETECTED
BENZODIAZEPINE, UR SCRN: NOT DETECTED
Barbiturates, Ur Screen: NOT DETECTED
Cannabinoid 50 Ng, Ur ~~LOC~~: NOT DETECTED
Cocaine Metabolite,Ur ~~LOC~~: NOT DETECTED
MDMA (ECSTASY) UR SCREEN: NOT DETECTED
METHADONE SCREEN, URINE: NOT DETECTED
OPIATE, UR SCREEN: NOT DETECTED
Phencyclidine (PCP) Ur S: NOT DETECTED
Tricyclic, Ur Screen: NOT DETECTED

## 2016-12-05 LAB — GLUCOSE, CAPILLARY
GLUCOSE-CAPILLARY: 310 mg/dL — AB (ref 65–99)
GLUCOSE-CAPILLARY: 290 mg/dL — AB (ref 65–99)
Glucose-Capillary: 424 mg/dL — ABNORMAL HIGH (ref 65–99)
Glucose-Capillary: 297 mg/dL — ABNORMAL HIGH (ref 65–99)

## 2016-12-05 LAB — MAGNESIUM: MAGNESIUM: 1.7 mg/dL (ref 1.7–2.4)

## 2016-12-05 LAB — BLOOD GAS, VENOUS
ACID-BASE DEFICIT: 17.3 mmol/L — AB (ref 0.0–2.0)
BICARBONATE: 10 mmol/L — AB (ref 20.0–28.0)
FIO2: 0.21
PCO2 VEN: 28 mmHg — AB (ref 44.0–60.0)
PH VEN: 7.16 — AB (ref 7.250–7.430)
Patient temperature: 37

## 2016-12-05 LAB — PHOSPHORUS: Phosphorus: 1.5 mg/dL — ABNORMAL LOW (ref 2.5–4.6)

## 2016-12-05 MED ORDER — SODIUM CHLORIDE 0.9 % IV SOLN
INTRAVENOUS | Status: DC
  Administered 2016-12-05: 22:00:00 via INTRAVENOUS

## 2016-12-05 MED ORDER — DEXTROSE-NACL 5-0.45 % IV SOLN
INTRAVENOUS | Status: DC
  Administered 2016-12-06: 05:00:00 via INTRAVENOUS

## 2016-12-05 MED ORDER — SODIUM CHLORIDE 0.9 % IV SOLN
1000.0000 mL | Freq: Once | INTRAVENOUS | Status: AC
  Administered 2016-12-05: 1000 mL via INTRAVENOUS

## 2016-12-05 MED ORDER — BUDESONIDE 180 MCG/ACT IN AEPB: 1.0000 | INHALATION_SPRAY | Freq: Two times a day (BID) | RESPIRATORY_TRACT | Status: DC

## 2016-12-05 MED ORDER — INSULIN REGULAR HUMAN 100 UNIT/ML IJ SOLN
INTRAMUSCULAR | Status: DC
  Administered 2016-12-05: 9 [IU]/h via INTRAVENOUS
  Filled 2016-12-05: qty 2.5

## 2016-12-05 MED ORDER — DILTIAZEM HCL ER COATED BEADS 120 MG PO CP24
120.0000 mg | ORAL_CAPSULE | Freq: Every day | ORAL | Status: DC
  Administered 2016-12-06 – 2016-12-07 (×2): 120 mg via ORAL
  Filled 2016-12-05 (×2): qty 1

## 2016-12-05 MED ORDER — BUDESONIDE 0.25 MG/2ML IN SUSP
0.2500 mg | Freq: Two times a day (BID) | RESPIRATORY_TRACT | Status: DC
  Filled 2016-12-05 (×2): qty 2

## 2016-12-05 MED ORDER — FLUTICASONE PROPIONATE 50 MCG/ACT NA SUSP
2.0000 | Freq: Every day | NASAL | Status: DC
  Filled 2016-12-05 (×2): qty 16

## 2016-12-05 MED ORDER — AMITRIPTYLINE HCL 10 MG PO TABS
10.0000 mg | ORAL_TABLET | Freq: Three times a day (TID) | ORAL | Status: DC
  Filled 2016-12-05 (×7): qty 1

## 2016-12-05 MED ORDER — SODIUM CHLORIDE 0.9 % IV SOLN
30.0000 meq | Freq: Once | INTRAVENOUS | Status: AC
  Administered 2016-12-06: 30 meq via INTRAVENOUS
  Filled 2016-12-05: qty 15

## 2016-12-05 MED ORDER — SUMATRIPTAN SUCCINATE 50 MG PO TABS
50.0000 mg | ORAL_TABLET | ORAL | Status: DC | PRN
  Filled 2016-12-05: qty 1

## 2016-12-05 MED ORDER — ENOXAPARIN SODIUM 40 MG/0.4ML ~~LOC~~ SOLN
40.0000 mg | Freq: Every day | SUBCUTANEOUS | Status: DC
  Administered 2016-12-06: 40 mg via SUBCUTANEOUS
  Filled 2016-12-05: qty 0.4

## 2016-12-05 MED ORDER — MONTELUKAST SODIUM 10 MG PO TABS
10.0000 mg | ORAL_TABLET | Freq: Every day | ORAL | Status: DC
  Filled 2016-12-05 (×2): qty 1

## 2016-12-05 NOTE — ED Notes (Signed)
Pt reports improved nausea after about 79mL of fluid.

## 2016-12-05 NOTE — ED Triage Notes (Signed)
Pt. States nausea for the past week.  Pt. Hx of diabetes, pt. States taking steroids this week.  Pt. States elevated BS yesterday.

## 2016-12-05 NOTE — ED Provider Notes (Signed)
[facility] Emergency Department Provider Note   ____________________________________________    I have reviewed the triage vital signs and the nursing notes.   HISTORY  Chief Complaint Nausea (Pt. states on going nausea for the past week.)     HPI Kelly Matthews is a 51 y.o. female who presents with complaints of nausea and vomiting. She reports weight loss as well. She reports she feels rundown and weak as well. Denies fevers or chills. No cough. No dysuria although she reports polyuria. She has a history of diabetes and has been compliant with her medications. She was recently on prednisone, she was put on this by her physiatrist   Past Medical History:  Diagnosis Date  . Diabetes mellitus without complication ([place])   . Hypertension     Patient Active Problem List   Diagnosis Date Noted  . Migraine headache 05/05/2016  . Allergic rhinitis 07/23/2015  . Excess, menstruation 07/19/2015  . 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 Blood glucose elevated 07/19/2015  . Airway hyperreactivity 07/19/2015  . Disorder of esophagus 07/19/2015  . Hypercholesterolemia without hypertriglyceridemia 07/19/2015  . Borderline diabetes 07/19/2015  . Diabetes ([place]) 11/28/2014  . Essential (primary) hypertension 11/28/2014  . Aortic heart valve narrowing 11/28/2014  . 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 Supraventricular tachycardia ([place]) 11/28/2014  . 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 BREAST BIOPSY Left 04/18/2015   Benign calcification  . CESAREAN SECTION  1990,2001  . INTRAUTERINE DEVICE INSERTION  October 2015   [facility]    Prior to Admission medications   Medication Sig Start Date End Date Taking? Authorizing Provider  amitriptyline (ELAVIL) 10 MG tablet TAKE 1 TABLET (10 MG TOTAL) BY MOUTH 3 (THREE) TIMES DAILY. 10/06/16   Vickki Muff  Chrismon, PA  budesonide (PULMICORT FLEXHALER) 180 MCG/ACT inhaler  09/25/14   Historical Provider, MD  diltiazem (DILACOR XR) 120 MG 24 hr capsule 1 capsule daily. 12/03/14   Historical Provider, MD  fluticasone (FLONASE) 50 MCG/ACT nasal spray SPRAY 2 SPRAYS IN EACH NOSTRIL DAILY 07/26/15   Vickki Muff Chrismon, PA  hydrochlorothiazide (MICROZIDE) 12.5 MG capsule 1 capsule daily. 11/12/14   Historical Provider, MD  metFORMIN (GLUCOPHAGE) 500 MG tablet TAKE 1 TABLET (500 MG TOTAL) BY MOUTH 2 (TWO) TIMES DAILY. 11/06/16   Vickki Muff Chrismon, PA  montelukast (SINGULAIR) 10 MG tablet TAKE 1 TABLET BY MOUTH EVERY DAY 10/30/16   Vickki Muff Chrismon, PA  ondansetron (ZOFRAN) 4 MG tablet Take 1 tablet (4 mg total) by mouth every 6 (six) hours as needed for nausea or vomiting. 08/28/15   Lucilla Lame, MD  ONE TOUCH ULTRA TEST test strip USE 1 STRIP TWICE DAILY AS NEEDED 10/22/15   Vickki Muff Chrismon, PA  ONETOUCH DELICA LANCETS [id number] MISC USE TO CHECK BLOOD SUGAR TWICE A DAY 04/28/15   Historical Provider, MD  SUMAtriptan (IMITREX) 50 MG tablet Take one tablet by mouth at onset of migraine. May repeat in 2 hours if headache persists or recurs. Limit 2 doses in 24 hours. 04/24/16   Reardan, PA     Allergies Patient has no known allergies.  Family History  Problem Relation Age of Onset  . Coronary artery disease Mother   . Coronary artery disease Father   . Diabetes Father   . Emphysema Father   . Seizures Daughter   . Cancer Maternal Grandmother   .  Breast cancer Neg Hx     Social History Social History  Substance Use Topics  . Smoking status: Former Smoker    Years: 1.00  . Smokeless tobacco: Never Used  . Alcohol use No     Comment: occasionally    Review of Systems  Constitutional: No fever/chills  ENT: No sore throat. Cardiovascular: Denies chest pain. Respiratory: Denies shortness of breath. Gastrointestinal: No abdominal pain   Genitourinary: Negative for  dysuria.Polyuria Musculoskeletal: Negative for back pain. Skin: Negative for rash. Neurological: Negative for headaches   10-point ROS otherwise negative.  ____________________________________________   PHYSICAL EXAM:  VITAL SIGNS: ED Triage Vitals  Enc Vitals Group     BP 12/05/16 1603 136/85     Pulse Rate 12/05/16 1603 (!) 106     Resp 12/05/16 1603 18     Temp 12/05/16 1603 98.7 F (37.1 C)     Temp Source 12/05/16 1603 Oral     SpO2 12/05/16 1603 99 %     Weight 12/05/16 1603 195 lb (88.5 kg)     Height 12/05/16 1603 4\' 11"  (1.499 m)     Head Circumference --      Peak Flow --      Pain Score 12/05/16 1827 0     Pain Loc --      Pain Edu? --      Excl. in [place]? --     Constitutional: Alert and oriented. No acute distress. Pleasant and interactive Eyes: Conjunctivae are normal.   Nose: No congestion/rhinnorhea. Mouth/Throat: Mucous membranes are Dry  Cardiovascular: Tachycardia, regular rhythm. Grossly normal heart sounds.  Good peripheral circulation. Respiratory: Normal respiratory effort.  No retractions. Gastrointestinal: Soft and nontender. No distention.  No CVA tenderness. Genitourinary: deferred Musculoskeletal: Warm and well perfused Neurologic:  Normal speech and language. No gross focal neurologic deficits are appreciated.  Skin:  Skin is warm, dry and intact. No rash noted. Psychiatric: Mood and affect are normal. Speech and behavior are normal.  ____________________________________________   LABS (all labs ordered are listed, but only abnormal results are displayed)  Labs Reviewed  BASIC METABOLIC PANEL - Abnormal; Notable for the following:       Result Value   Sodium 127 (*)    Potassium 2.7 (*)    Chloride 100 (*)    CO2 8 (*)    Glucose, Bld 443 (*)    Anion gap 19 (*)    All other components within normal limits  CBC - Abnormal; Notable for the following:    WBC 23.1 (*)    RBC 5.73 (*)    Hemoglobin 16.1 (*)    HCT 47.6 (*)    All  other components within normal limits  URINALYSIS, COMPLETE (UACMP) WITH MICROSCOPIC - Abnormal; Notable for the following:    Color, Urine STRAW (*)    APPearance CLEAR (*)    Glucose, UA >=500 (*)    Hgb urine dipstick LARGE (*)    Ketones, ur 80 (*)    Protein, ur 100 (*)    Squamous Epithelial / LPF 0-5 (*)    All other components within normal limits  GLUCOSE, CAPILLARY - Abnormal; Notable for the following:    Glucose-Capillary 424 (*)    All other components within normal limits  BLOOD GAS, VENOUS  CBG MONITORING, ED   ____________________________________________  EKG  None ____________________________________________  RADIOLOGY  None ____________________________________________   PROCEDURES  Procedure(s) performed: No    Critical Care performed: yes  CRITICAL CARE  Performed by: Lavonia Drafts   Total critical care time:58minutes  Critical care time was exclusive of separately billable procedures and treating other patients.  Critical care was necessary to treat or prevent imminent or life-threatening deterioration.  Critical care was time spent personally by me on the following activities: development of treatment plan with patient and/or surrogate as well as nursing, discussions with consultants, evaluation of patient's response to treatment, examination of patient, obtaining history from patient or surrogate, ordering and performing treatments and interventions, ordering and review of laboratory studies, ordering and review of radiographic studies, pulse oximetry and re-evaluation of patient's condition.  ____________________________________________   INITIAL IMPRESSION / ASSESSMENT AND PLAN / ED COURSE  Pertinent labs & imaging results that were available during my care of the patient were reviewed by me and considered in my medical decision making (see chart for details).  Patient presents with nausea vomiting, weight loss, fatigue. She is  tachycardic and dehydrated appearing. Lab work is concerning for hyperkalemia hyponatremia, elevated glucose and elevated anion gap consistent with diabetic ketoacidosis. She has a significantly elevated white blood cell count however no evidence of infection We will give her 2 L of IV fluids in the emergency department and admit to the hospitalist service for further management including insulin drip   ____________________________________________   FINAL CLINICAL IMPRESSION(S) / ED DIAGNOSES  Final diagnoses:  Diabetic ketoacidosis without coma associated with type 2 diabetes mellitus ([place])      NEW MEDICATIONS STARTED DURING THIS VISIT:  New Prescriptions   No medications on file     Note:  This document was prepared using Dragon voice recognition software and may include unintentional dictation errors.    Lavonia Drafts, MD 12/05/16 1911

## 2016-12-05 NOTE — H&P (Signed)
History and Physical   [facility] Admission History and Physical [employer], D.O.    Patient Name: Kelly Matthews MR#: [id number] Date of Birth: [birth year] Date of Admission: 12/05/2016  Referring MD/NP/PA: Dr. Corky Downs Primary Care Physician: [name], PA Patient coming from: Home  Chief Complaint: Nausea  HPI: Kelly Matthews is a 51 y.o. female with a known history of DM, HTN, allergic rhinitis, migraines, fatty liver, aortic stenosis was in a usual state of health until 2 weeks ago when she reports gradual onset of generalized weakness associated with nausea. She reports a 35 pound weight loss in the last 2 weeks secondary to decreased by mouth intake because of poor appetite and nausea. Of note patient states that she was started on oxycodone and gabapentin by her physiatry rest for cervical radiculopathy. When that didn't help significantly she was started on a course of steroids. She states that she has not been checking her blood sugars at home but has been compliant with her medication. She is scheduled for cervical epidural spinal injection with corticosteroid. She denies any recent infectious symptoms such as fevers, chills, pus patient, diarrhea, dysuria, cough. Patient denies dizziness, chest pain, shortness of breath, abdominal pain, dysuria/frequency, changes in mental status.    Otherwise there has been no change in status. Patient has been taking medication as prescribed and there has been no recent change in medication or diet.  No recent antibiotics.  There has been no recent illness, hospitalizations, travel or sick contacts.    ED Course: Patient received 2L NS.   Review of Systems:  CONSTITUTIONAL: No fever/chills, headache. Positive fatigue and generalized weakness, 35 pound weight loss in 2 weeks EYES: No blurry or double vision. ENT: No tinnitus, postnasal drip, redness or soreness of the oropharynx. RESPIRATORY: No  cough, dyspnea, wheeze.  No hemoptysis.  CARDIOVASCULAR: No chest pain, palpitations, syncope, orthopnea. No lower extremity edema.  GASTROINTESTINAL: Positive nausea, negative vomiting, abdominal pain, diarrhea, constipation.  No hematemesis, melena or hematochezia. GENITOURINARY: No dysuria, frequency, hematuria. ENDOCRINE: No polyuria or nocturia. No heat or cold intolerance. HEMATOLOGY: No anemia, bruising, bleeding. INTEGUMENTARY: No rashes, ulcers, lesions. MUSCULOSKELETAL: No arthritis, gout, dyspnea. NEUROLOGIC: No numbness, tingling, ataxia, seizure-type activity, weakness. PSYCHIATRIC: No anxiety, depression, insomnia.   Past Medical History:  Diagnosis Date  . Diabetes mellitus without complication ([place])   . Hypertension   Fatty liver Allergic rhinitis Migraines Aortic stenosis Hyperlipidemia SVT Insomnia  Past Surgical History:  Procedure Laterality Date  . BREAST BIOPSY Left 04/18/2015   Benign calcification  . CESAREAN SECTION  1990,2001  . INTRAUTERINE DEVICE INSERTION  October 2015   [facility]     reports that she has quit smoking. She quit after 1.00 year of use. She has never used smokeless tobacco. She reports that she does not drink alcohol or use drugs.  No Known Allergies  Family History  Problem Relation Age of Onset  . Coronary artery disease Mother   . Coronary artery disease Father   . Diabetes Father   . Emphysema Father   . Seizures Daughter   . Cancer Maternal Grandmother   . Breast cancer Neg Hx    Family history has been reviewed and confirmed with patient.   Prior to Admission medications   Medication Sig Start Date End Date Taking? Authorizing Provider  amitriptyline (ELAVIL) 10 MG tablet TAKE 1 TABLET (10 MG TOTAL) BY MOUTH 3 (THREE) TIMES DAILY. 10/06/16  Yes Dennis E Chrismon, PA  budesonide (  PULMICORT FLEXHALER) 180 MCG/ACT inhaler  09/25/14  Yes Historical Provider, MD  diltiazem (DILACOR XR) 120 MG 24 hr capsule 1 capsule  daily. 12/03/14  Yes Historical Provider, MD  fluticasone (FLONASE) 50 MCG/ACT nasal spray SPRAY 2 SPRAYS IN EACH NOSTRIL DAILY 07/26/15  Yes Dennis E Chrismon, PA  metFORMIN (GLUCOPHAGE) 500 MG tablet TAKE 1 TABLET (500 MG TOTAL) BY MOUTH 2 (TWO) TIMES DAILY. Patient taking differently: Take 500-1,000 mg by mouth 2 (two) times daily. Take 1,000 mg in the morning and 500 mg in the evening. 11/06/16  Yes Dennis E Chrismon, PA  montelukast (SINGULAIR) 10 MG tablet TAKE 1 TABLET BY MOUTH EVERY DAY 10/30/16  Yes Dennis E Chrismon, PA  ondansetron (ZOFRAN) 4 MG tablet Take 1 tablet (4 mg total) by mouth every 6 (six) hours as needed for nausea or vomiting. 08/28/15  Yes Lucilla Lame, MD  SUMAtriptan (IMITREX) 50 MG tablet Take one tablet by mouth at onset of migraine. May repeat in 2 hours if headache persists or recurs. Limit 2 doses in 24 hours. 04/24/16  Yes Dennis E Chrismon, PA  ONE TOUCH ULTRA TEST test strip USE 1 STRIP TWICE DAILY AS NEEDED 10/22/15   Vickki Muff Chrismon, PA  ONETOUCH DELICA LANCETS [id number] MISC USE TO CHECK BLOOD SUGAR TWICE A DAY 04/28/15   Historical Provider, MD    Physical Exam: Vitals:   12/05/16 1900 12/05/16 1938 12/05/16 2000 12/05/16 2030  BP: (!) 151/93 (!) 168/93 (!) 162/88 (!) 165/79  Pulse: 89 95 91 93  Resp: 19 17 18 15   Temp:      TempSrc:      SpO2: 100% 100% 100% 100%  Weight:      Height:        GENERAL: 51 y.o.-year-old female patient, well-developed, well-nourished lying in the bed in no acute distress.  Pleasant and cooperative.   HEENT: Head atraumatic, normocephalic. Pupils equal, round, reactive to light and accommodation. No scleral icterus. Extraocular muscles intact. Nares are patent. Oropharynx is clear. Mucus membranes dry. NECK: Supple, full range of motion. No JVD, no bruit heard. No thyroid enlargement, no tenderness, no cervical lymphadenopathy. CHEST: Normal breath sounds bilaterally. No wheezing, rales, rhonchi or crackles. No use of accessory  muscles of respiration.  No reproducible chest wall tenderness.  CARDIOVASCULAR: S1, S2 normal. No murmurs, rubs, or gallops. Cap refill <2 seconds. Pulses intact distally.  ABDOMEN: Soft, nondistended, nontender. No rebound, guarding, rigidity. Normoactive bowel sounds present in all four quadrants. No organomegaly or mass. EXTREMITIES: No pedal edema, cyanosis, or clubbing. No calf tenderness or Homan's sign.  NEUROLOGIC: The patient is alert and oriented x 3. Cranial nerves II through XII are grossly intact with no focal sensorimotor deficit. Muscle strength 5/5 in all extremities. Sensation intact. Gait not checked. PSYCHIATRIC:  Normal affect, mood, thought content. SKIN: Warm, dry, and intact without obvious rash, lesion, or ulcer.    Labs on Admission:  CBC:  Recent Labs Lab 12/05/16 1602  WBC 23.1*  HGB 16.1*  HCT 47.6*  MCV 83.0  PLT [id number]   Basic Metabolic Panel:  Recent Labs Lab 12/05/16 1602  NA 127*  K 2.7*  CL 100*  CO2 8*  GLUCOSE 443*  BUN 11  CREATININE 0.71  CALCIUM 9.1   GFR: Estimated Creatinine Clearance: 82.3 mL/min (by C-G formula based on SCr of 0.71 mg/dL). Liver Function Tests: No results for input(s): AST, ALT, ALKPHOS, BILITOT, PROT, ALBUMIN in the last 168 hours. No results for input(s): LIPASE,  AMYLASE in the last 168 hours. No results for input(s): AMMONIA in the last 168 hours. Coagulation Profile: No results for input(s): INR, PROTIME in the last 168 hours. Cardiac Enzymes: No results for input(s): CKTOTAL, CKMB, CKMBINDEX, TROPONINI in the last 168 hours. BNP (last 3 results) No results for input(s): PROBNP in the last 8760 hours. HbA1C: No results for input(s): HGBA1C in the last 72 hours. CBG:  Recent Labs Lab 12/05/16 1630 12/05/16 2007  GLUCAP 424* 297*   Lipid Profile: No results for input(s): CHOL, HDL, LDLCALC, TRIG, CHOLHDL, LDLDIRECT in the last 72 hours. Thyroid Function Tests: No results for input(s): TSH,  T4TOTAL, FREET4, T3FREE, THYROIDAB in the last 72 hours. Anemia Panel: No results for input(s): VITAMINB12, FOLATE, FERRITIN, TIBC, IRON, RETICCTPCT in the last 72 hours. Urine analysis:    Component Value Date/Time   COLORURINE STRAW (A) 12/05/2016 1602   APPEARANCEUR CLEAR (A) 12/05/2016 1602   LABSPEC 1.029 12/05/2016 1602   PHURINE 6.0 12/05/2016 1602   GLUCOSEU >=500 (A) 12/05/2016 1602   HGBUR LARGE (A) 12/05/2016 1602   BILIRUBINUR NEGATIVE 12/05/2016 1602   BILIRUBINUR neg 05/31/2015 0919   KETONESUR 80 (A) 12/05/2016 1602   PROTEINUR 100 (A) 12/05/2016 1602   UROBILINOGEN 0.2 05/31/2015 0919   NITRITE NEGATIVE 12/05/2016 1602   LEUKOCYTESUR NEGATIVE 12/05/2016 1602   Sepsis Labs: @LABRCNTIP (procalcitonin:4,lacticidven:4) )No results found for this or any previous visit (from the past 240 hour(s)).   Radiological Exams on Admission: No results found.  Assessment/Plan Active Problems:   DKA (diabetic ketoacidoses) ([place])    This is a 51 y.o. female with a history of DM, HTN, allergic rhinitis, migraines, fatty liver, aortic stenosis now being admitted with:  1. Moderate DKA with multiple electrolyte abnormalities, possibly secondary to steroid use. No evidence of infection. Leukocytosis likely secondary to steroid use. - Admit stepdown, telemetry - Aggressive IV fluid hydration - IV insulin - Correct electrolytes - NPO - BMP q4h - Check serum ketones  2. Dehydration - IV fluids  3. H/O HTN - Continue diltiazem  4. H/O allergic rhinitis - Continue Pulmicort, Flonase, Singulair  5. H/O Migraines - Continue Imitrex PRN  6. H/O cervical radiculiis - Continue Elavil, gabapentin  Admission status: Inpatient, stepdown IV Fluids: NS Diet/Nutrition: NPO Consults called: None  DVT Px: Lovenox, SCDs and early ambulation. Code Status: Full Code  Disposition Plan: To home in 1-2 days   All the records are reviewed and case discussed with ED provider.  Management plans discussed with the patient and/or family who express understanding and agree with plan of care.  Tavi Hoogendoorn D.O. on 12/05/2016 at 9:00 PM Between 7am to 6pm - Pager - [phone] After 6pm go to [email or web address] - [employer] [facility] Office [phone] CC: Primary care physician; [name], PA   12/05/2016, 9:00 PM

## 2016-12-05 NOTE — ED Notes (Signed)
Charge aware of K level.  Will place in bed when available

## 2016-12-06 LAB — BASIC METABOLIC PANEL
ANION GAP: 13 (ref 5–15)
ANION GAP: 12 (ref 5–15)
Anion gap: 15 (ref 5–15)
Anion gap: 9 (ref 5–15)
BUN: 7 mg/dL (ref 6–20)
BUN: 6 mg/dL (ref 6–20)
BUN: 5 mg/dL — AB (ref 6–20)
CALCIUM: 8 mg/dL — AB (ref 8.9–10.3)
CALCIUM: 8.4 mg/dL — AB (ref 8.9–10.3)
CALCIUM: 8.6 mg/dL — AB (ref 8.9–10.3)
CALCIUM: 8.6 mg/dL — AB (ref 8.9–10.3)
CO2: 10 mmol/L — ABNORMAL LOW (ref 22–32)
CO2: 12 mmol/L — AB (ref 22–32)
CO2: 13 mmol/L — AB (ref 22–32)
CO2: 18 mmol/L — ABNORMAL LOW (ref 22–32)
CREATININE: 0.62 mg/dL (ref 0.44–1.00)
CREATININE: 0.58 mg/dL (ref 0.44–1.00)
CREATININE: 0.41 mg/dL — AB (ref 0.44–1.00)
CREATININE: 0.57 mg/dL (ref 0.44–1.00)
Chloride: 110 mmol/L (ref 101–111)
Chloride: 111 mmol/L (ref 101–111)
Chloride: 112 mmol/L — ABNORMAL HIGH (ref 101–111)
Chloride: 109 mmol/L (ref 101–111)
GFR calc Af Amer: >60 mL/min (ref >60)
GFR calc Af Amer: >60 mL/min (ref >60)
GFR calc Af Amer: >60 mL/min (ref >60)
GFR calc Af Amer: >60 mL/min (ref >60)
GLUCOSE: 264 mg/dL — AB (ref 65–99)
GLUCOSE: 210 mg/dL — AB (ref 65–99)
GLUCOSE: 182 mg/dL — AB (ref 65–99)
GLUCOSE: 136 mg/dL — AB (ref 65–99)
Potassium: 2.1 mmol/L — CL (ref 3.5–5.1)
Potassium: 2.2 mmol/L — CL (ref 3.5–5.1)
Potassium: 2.5 mmol/L — CL (ref 3.5–5.1)
Potassium: 2.3 mmol/L — CL (ref 3.5–5.1)
Sodium: 135 mmol/L (ref 135–145)
Sodium: 136 mmol/L (ref 135–145)
Sodium: 137 mmol/L (ref 135–145)
Sodium: 136 mmol/L (ref 135–145)

## 2016-12-06 LAB — GLUCOSE, CAPILLARY
GLUCOSE-CAPILLARY: 219 mg/dL — AB (ref 65–99)
GLUCOSE-CAPILLARY: 186 mg/dL — AB (ref 65–99)
GLUCOSE-CAPILLARY: 190 mg/dL — AB (ref 65–99)
GLUCOSE-CAPILLARY: 153 mg/dL — AB (ref 65–99)
GLUCOSE-CAPILLARY: 123 mg/dL — AB (ref 65–99)
GLUCOSE-CAPILLARY: 152 mg/dL — AB (ref 65–99)
GLUCOSE-CAPILLARY: 105 mg/dL — AB (ref 65–99)
GLUCOSE-CAPILLARY: 382 mg/dL — AB (ref 65–99)
GLUCOSE-CAPILLARY: 393 mg/dL — AB (ref 65–99)
Glucose-Capillary: 135 mg/dL — ABNORMAL HIGH (ref 65–99)
Glucose-Capillary: 176 mg/dL — ABNORMAL HIGH (ref 65–99)
Glucose-Capillary: 204 mg/dL — ABNORMAL HIGH (ref 65–99)
Glucose-Capillary: 206 mg/dL — ABNORMAL HIGH (ref 65–99)
Glucose-Capillary: 207 mg/dL — ABNORMAL HIGH (ref 65–99)
Glucose-Capillary: 215 mg/dL — ABNORMAL HIGH (ref 65–99)
Glucose-Capillary: 241 mg/dL — ABNORMAL HIGH (ref 65–99)
Glucose-Capillary: 265 mg/dL — ABNORMAL HIGH (ref 65–99)
Glucose-Capillary: 284 mg/dL — ABNORMAL HIGH (ref 65–99)

## 2016-12-06 LAB — MRSA PCR SCREENING: MRSA BY PCR: NEGATIVE

## 2016-12-06 LAB — PHOSPHORUS

## 2016-12-06 LAB — MAGNESIUM: MAGNESIUM: 1.8 mg/dL (ref 1.7–2.4)

## 2016-12-06 MED ORDER — POTASSIUM CHLORIDE CRYS ER 20 MEQ PO TBCR
40.0000 meq | EXTENDED_RELEASE_TABLET | Freq: Once | ORAL | Status: AC
  Administered 2016-12-06: 40 meq via ORAL
  Filled 2016-12-06: qty 2

## 2016-12-06 MED ORDER — INSULIN ASPART 100 UNIT/ML ~~LOC~~ SOLN
0.0000 [IU] | Freq: Every day | SUBCUTANEOUS | Status: DC
  Administered 2016-12-06: 22:00:00 5 [IU] via SUBCUTANEOUS
  Filled 2016-12-06: qty 5

## 2016-12-06 MED ORDER — INSULIN ASPART 100 UNIT/ML ~~LOC~~ SOLN
4.0000 [IU] | Freq: Three times a day (TID) | SUBCUTANEOUS | Status: DC
  Administered 2016-12-07 (×3): 4 [IU] via SUBCUTANEOUS
  Filled 2016-12-06 (×3): qty 4

## 2016-12-06 MED ORDER — ONDANSETRON HCL 4 MG/2ML IJ SOLN
4.0000 mg | Freq: Four times a day (QID) | INTRAMUSCULAR | Status: DC | PRN
  Administered 2016-12-06 (×2): 4 mg via INTRAVENOUS
  Filled 2016-12-06 (×2): qty 2

## 2016-12-06 MED ORDER — GLUCERNA SHAKE PO LIQD
237.0000 mL | Freq: Three times a day (TID) | ORAL | Status: DC
  Administered 2016-12-06 – 2016-12-07 (×3): 237 mL via ORAL

## 2016-12-06 MED ORDER — SODIUM CHLORIDE 0.9 % IV SOLN
30.0000 meq | Freq: Once | INTRAVENOUS | Status: AC
  Administered 2016-12-06: 30 meq via INTRAVENOUS
  Filled 2016-12-06: qty 15

## 2016-12-06 MED ORDER — MAGNESIUM SULFATE 2 GM/50ML IV SOLN
2.0000 g | Freq: Once | INTRAVENOUS | Status: AC
  Administered 2016-12-06: 2 g via INTRAVENOUS
  Filled 2016-12-06: qty 50

## 2016-12-06 MED ORDER — POTASSIUM PHOSPHATES 15 MMOLE/5ML IV SOLN
30.0000 mmol | Freq: Once | INTRAVENOUS | Status: AC
  Administered 2016-12-06: 30 mmol via INTRAVENOUS
  Filled 2016-12-06: qty 10

## 2016-12-06 MED ORDER — ACETAMINOPHEN 325 MG PO TABS
650.0000 mg | ORAL_TABLET | Freq: Four times a day (QID) | ORAL | Status: DC | PRN
  Administered 2016-12-06: 650 mg via ORAL
  Filled 2016-12-06: qty 2

## 2016-12-06 MED ORDER — INSULIN ASPART 100 UNIT/ML ~~LOC~~ SOLN
0.0000 [IU] | Freq: Three times a day (TID) | SUBCUTANEOUS | Status: DC
  Administered 2016-12-07: 17:00:00 11 [IU] via SUBCUTANEOUS
  Administered 2016-12-07: 08:00:00 5 [IU] via SUBCUTANEOUS
  Administered 2016-12-07: 15 [IU] via SUBCUTANEOUS
  Filled 2016-12-06: qty 15
  Filled 2016-12-06: qty 5
  Filled 2016-12-06: qty 11

## 2016-12-06 MED ORDER — INFLUENZA VAC SPLIT QUAD 0.5 ML IM SUSY
0.5000 mL | PREFILLED_SYRINGE | INTRAMUSCULAR | Status: AC
  Administered 2016-12-07: 08:00:00 0.5 mL via INTRAMUSCULAR
  Filled 2016-12-06: qty 0.5

## 2016-12-06 MED ORDER — SODIUM CHLORIDE 0.9 % IV SOLN
30.0000 meq | Freq: Once | INTRAVENOUS | Status: DC
  Filled 2016-12-06: qty 15

## 2016-12-06 MED ORDER — INSULIN GLARGINE 100 UNIT/ML ~~LOC~~ SOLN
25.0000 [IU] | Freq: Every day | SUBCUTANEOUS | Status: DC
  Administered 2016-12-06 – 2016-12-07 (×2): 25 [IU] via SUBCUTANEOUS
  Filled 2016-12-06 (×4): qty 0.25

## 2016-12-06 MED ORDER — PNEUMOCOCCAL VAC POLYVALENT 25 MCG/0.5ML IJ INJ: 0.5000 mL | INJECTION | INTRAMUSCULAR | Status: DC

## 2016-12-06 MED ORDER — SODIUM CHLORIDE 0.9 % IV SOLN
1000.0000 mL | Freq: Once | INTRAVENOUS | Status: AC
  Administered 2016-12-06: 1000 mL via INTRAVENOUS

## 2016-12-06 MED ORDER — METFORMIN HCL 500 MG PO TABS
500.0000 mg | ORAL_TABLET | Freq: Two times a day (BID) | ORAL | Status: DC
  Administered 2016-12-06 – 2016-12-07 (×3): 500 mg via ORAL
  Filled 2016-12-06 (×3): qty 1

## 2016-12-06 MED ORDER — SODIUM CHLORIDE 0.9 % IV SOLN: 1000.0000 mL | INTRAVENOUS | Status: DC

## 2016-12-06 NOTE — Progress Notes (Addendum)
ELECTROLYTE CONSULT NOTE - INITIAL   Pharmacy Consult for electrolyte management Indication: DKA  No Known Allergies  Patient Measurements: Height: 4\' 11"  (149.9 cm) Weight: 197 lb 5 oz (89.5 kg) IBW/kg (Calculated) : 43.2 Adjusted Body Weight:   Vital Signs: Temp: 97.6 F (36.4 C) (01/21 0800) Temp Source: Oral (01/21 0800) BP: 135/120 (01/21 1000) Pulse Rate: 87 (01/21 1100) Intake/Output from previous day: 01/20 0701 - 01/21 0700 In: 840 [P.O.:100; I.V.:475; IV Piggyback:265] Out: [id number] [Urine:1650] Intake/Output from this shift: Total I/O In: 419.1 [I.V.:419.1] Out: -   Labs:  Recent Labs (last 2 labs)    Recent Labs  12/05/16 1602 12/05/16 2038 12/06/16 0109 12/06/16 0440 12/06/16 0814  WBC 23.1*  --   --   --   --   HGB 16.1*  --   --   --   --   HCT 47.6*  --   --   --   --   PLT 192  --   --   --   --   CREATININE 0.71 0.79 0.62 0.58 0.41*  MG  --  1.7  --   --   --   PHOS  --  1.5*  --   --   --      Estimated Creatinine Clearance: 82.9 mL/min (by C-G formula based on SCr of 0.41 mg/dL (L)).   Microbiology:        Recent Results (from the past 720 hour(s))  MRSA PCR Screening     Status: None   Collection Time: 12/05/16 10:45 PM  Result Value Ref Range Status   MRSA by PCR NEGATIVE NEGATIVE Final    Comment:        The GeneXpert MRSA Assay (FDA approved for NASAL specimens only), is one component of a comprehensive MRSA colonization surveillance program. It is not intended to diagnose MRSA infection nor to guide or monitor treatment for MRSA infections.     Medical History:     Past Medical History:  Diagnosis Date  . Diabetes mellitus without complication ([place])   . Hypertension     Medications:  Scheduled:  . sodium chloride  1,000 mL Intravenous Once  . amitriptyline  10 mg Oral TID  . budesonide  0.25 mg Nebulization BID  . diltiazem  120 mg Oral Daily  . enoxaparin (LOVENOX) injection  40 mg  Subcutaneous QHS  . fluticasone  2 spray Each Nare Daily  . [START ON 12/07/2016] Influenza vac split quadrivalent PF  0.5 mL Intramuscular Tomorrow-1000  . insulin glargine  25 Units Subcutaneous Daily  . metFORMIN  500 mg Oral BID WC  . montelukast  10 mg Oral Daily    Assessment: Patient admitted in ICU for DKA and is hypokalemic. Patient has received a total of 140 mEq of potassium (2 30 mEq IV and 2 40 mEq po). Phosphate level < 1.0 will replace with 30 mmol of Kphos over 6 hours and recheck BMP 1/22 Potassium 2.3 will replace with another 30 mEq multirun and recheck K+ with Phos 1/21 @ 2100. K+, Mag, Phos, BMET 1/22 @ 0500.  Goal of Therapy:  K+ 4.0 - 5.0 Mg 2.0 - 2.2 Phos 2.5 - 4.5  Plan:  Will monitor BMP and phosphate level and replace as needed. Will recheck phosphate level 1/21 @ 2100. Will monitor renal function.  Tobie Lords, PharmD, BCPS Clinical Pharmacist 12/06/2016

## 2016-12-06 NOTE — Progress Notes (Signed)
Called and spoke with Dr. Ether Griffins r/t pt.'s CBG of 382.  Discussed medications received within last few hours, lab work and PO intake. A1C ordered, sliding scale insulin ordered. Will continue to monitor pt. Closely.

## 2016-12-06 NOTE — Progress Notes (Signed)
Chaplain received an order to visit with pt in room IC-12. Provided pt with information for an Advanced Directive.    12/06/16 1000  Clinical Encounter Type  Visited With Patient  Visit Type Initial  Referral From Nurse  Spiritual Encounters  Spiritual Needs Other (Comment)

## 2016-12-06 NOTE — Progress Notes (Signed)
ELECTROLYTE CONSULT NOTE - INITIAL   Pharmacy Consult for electrolyte management Indication: DKA  No Known Allergies  Patient Measurements: Height: 4\' 11"  (149.9 cm) Weight: 197 lb 5 oz (89.5 kg) IBW/kg (Calculated) : 43.2 Adjusted Body Weight:   Vital Signs: Temp: 97.6 F (36.4 C) (01/21 0800) Temp Source: Oral (01/21 0800) BP: 135/120 (01/21 1000) Pulse Rate: 87 (01/21 1100) Intake/Output from previous day: 01/20 0701 - 01/21 0700 In: 840 [P.O.:100; I.V.:475; IV Piggyback:265] Out: [id number] [Urine:1650] Intake/Output from this shift: Total I/O In: 419.1 [I.V.:419.1] Out: -   Labs:  Recent Labs  12/05/16 1602 12/05/16 2038 12/06/16 0109 12/06/16 0440 12/06/16 0814  WBC 23.1*  --   --   --   --   HGB 16.1*  --   --   --   --   HCT 47.6*  --   --   --   --   PLT 192  --   --   --   --   CREATININE 0.71 0.79 0.62 0.58 0.41*  MG  --  1.7  --   --   --   PHOS  --  1.5*  --   --   --    Estimated Creatinine Clearance: 82.9 mL/min (by C-G formula based on SCr of 0.41 mg/dL (L)).   Microbiology: Recent Results (from the past 720 hour(s))  MRSA PCR Screening     Status: None   Collection Time: 12/05/16 10:45 PM  Result Value Ref Range Status   MRSA by PCR NEGATIVE NEGATIVE Final    Comment:        The GeneXpert MRSA Assay (FDA approved for NASAL specimens only), is one component of a comprehensive MRSA colonization surveillance program. It is not intended to diagnose MRSA infection nor to guide or monitor treatment for MRSA infections.     Medical History: Past Medical History:  Diagnosis Date  . Diabetes mellitus without complication ([place])   . Hypertension     Medications:  Scheduled:  . sodium chloride  1,000 mL Intravenous Once  . amitriptyline  10 mg Oral TID  . budesonide  0.25 mg Nebulization BID  . diltiazem  120 mg Oral Daily  . enoxaparin (LOVENOX) injection  40 mg Subcutaneous QHS  . fluticasone  2 spray Each Nare Daily  . [START ON  12/07/2016] Influenza vac split quadrivalent PF  0.5 mL Intramuscular Tomorrow-1000  . insulin glargine  25 Units Subcutaneous Daily  . metFORMIN  500 mg Oral BID WC  . montelukast  10 mg Oral Daily    Assessment: Patient admitted in ICU for DKA and is hypokalemic. Patient has received a total of 140 mEq of potassium (2 30 mEq IV and 2 40 mEq po). No magnesium level drawn, will drawn mag level and assess, BMP also in process.  Goal of Therapy:  K+ 3.5 - 5.0 Mg 1.8 - 2.2  Plan:  Will monitor BMP and Mg level and replace as needed. Will monitor renal function.  Tobie Lords, PharmD, BCPS Clinical Pharmacist 12/06/2016

## 2016-12-06 NOTE — Progress Notes (Signed)
[facility] [place] NAME: Kelly Matthews    MR#:  [id number]  DATE OF BIRTH:  [birth year]  SUBJECTIVE:   Patient came in with not feeling well increased thirst and increased urination pump to be in DKA and dehydration. She is off insulin drip. Feels hungry. Complains of generalized weakness. REVIEW OF SYSTEMS:   Review of Systems  Constitutional: Negative for chills, fever and weight loss.  HENT: Negative for ear discharge, ear pain and nosebleeds.   Eyes: Negative for blurred vision, pain and discharge.  Respiratory: Negative for sputum production, shortness of breath, wheezing and stridor.   Cardiovascular: Negative for chest pain, palpitations, orthopnea and PND.  Gastrointestinal: Negative for abdominal pain, diarrhea, nausea and vomiting.  Genitourinary: Negative for frequency and urgency.  Musculoskeletal: Negative for back pain and joint pain.  Neurological: Positive for weakness. Negative for sensory change, speech change and focal weakness.  Psychiatric/Behavioral: Negative for depression and hallucinations. The patient is not nervous/anxious.    Tolerating Diet:yes Tolerating PT: not needed  DRUG ALLERGIES:  No Known Allergies  VITALS:  Blood pressure (!) 135/120, pulse 87, temperature 97.6 F (36.4 C), temperature source Oral, resp. rate 13, height 4\' 11"  (1.499 m), weight 89.5 kg (197 lb 5 oz), SpO2 99 %.  PHYSICAL EXAMINATION:   Physical Exam  GENERAL:  51 y.o.-year-old patient lying in the bed with no acute distress.  EYES: Pupils equal, round, reactive to light and accommodation. No scleral icterus. Extraocular muscles intact.  HEENT: Head atraumatic, normocephalic. Oropharynx and nasopharynx clear.  NECK:  Supple, no jugular venous distention. No thyroid enlargement, no tenderness.  LUNGS: Normal breath sounds bilaterally, no wheezing, rales, rhonchi. No use of accessory muscles of respiration.   CARDIOVASCULAR: S1, S2 normal. No murmurs, rubs, or gallops.  ABDOMEN: Soft, nontender, nondistended. Bowel sounds present. No organomegaly or mass.  EXTREMITIES: No cyanosis, clubbing or edema b/l.    NEUROLOGIC: Cranial nerves II through XII are intact. No focal Motor or sensory deficits b/l.   PSYCHIATRIC:  patient is alert and oriented x 3.  SKIN: No obvious rash, lesion, or ulcer.   LABORATORY PANEL:  CBC  Recent Labs Lab 12/05/16 1602  WBC 23.1*  HGB 16.1*  HCT 47.6*  PLT 192    Chemistries   Recent Labs Lab 12/06/16 1200 12/06/16 1212  NA 136  --   K 2.3*  --   CL 109  --   CO2 18*  --   GLUCOSE 136*  --   BUN <5*  --   CREATININE 0.57  --   CALCIUM 8.6*  --   MG  --  1.8   Cardiac Enzymes No results for input(s): TROPONINI in the last 168 hours. RADIOLOGY:  No results found. ASSESSMENT AND PLAN:  51 y.o. female with a history of DM, HTN, allergic rhinitis, migraines, fatty liver, aortic stenosis now being admitted with:  1. Moderate DKA with multiple electrolyte abnormalities, possibly secondary to steroid use. No evidence of infection. Leukocytosis likely secondary to steroid use. - Aggressive IV fluid hydration -Patient now off IV insulin - Correct electrolytes pharmacy consulted Anion gap closed. -Changed to insulin Lantus 25 units daily  2. Dehydration - Received IV fluids -Bicarbonate improving. We'll continue IV hydration.  3. H/O HTN - Continue diltiazem  4. H/O allergic rhinitis - Continue Pulmicort, Flonase, Singulair  5. H/O Migraines - Continue Imitrex PRN  6. H/O cervical radiculiis - Continue Elavil, gabapentin  7. Hypokalemia  hypophosphatemia Pharmacy consult for replacing electrolytes  Transfer to medical floor  Case discussed with Care Management/Social Worker. Management plans discussed with the patient, family and they are in agreement.  CODE STATUS: Full  DVT Prophylaxis: Lovenox  TOTAL TIME TAKING CARE OF  THIS PATIENT: 30 minutes.  >50% time spent on counselling and coordination of care  POSSIBLE D/C IN one DAYS, DEPENDING ON CLINICAL CONDITION.  Note: This dictation was prepared with Dragon dictation along with smaller phrase technology. Any transcriptional errors that result from this process are unintentional.  Kelly Matthews M.D on 12/06/2016 at 1:53 PM  Between 7am to 6pm - Pager - [phone]  After 6pm go to [email or web address] - password [email or web address] [facility]  [facility]  Office  [phone]  CC: Primary care physician; [name], PA

## 2016-12-06 NOTE — Progress Notes (Signed)
Initial Nutrition Assessment  DOCUMENTATION CODES:   Non-severe (moderate) malnutrition in context of acute illness/injury  INTERVENTION:   Glucerna Shake po BID, each supplement provides 220 kcal and 10 grams of protein  NUTRITION DIAGNOSIS:   Malnutrition related to acute illness, poor appetite, nausea as evidenced by 14 percent weight loss in 3 months.  GOAL:   Patient will meet greater than or equal to 90% of their needs  MONITOR:   PO intake, Supplement acceptance, Labs, I & O's  REASON FOR ASSESSMENT:   Malnutrition Screening Tool    ASSESSMENT:   51 y.o. female with a history of DM, HTN, allergic rhinitis, migraines, fatty liver, aortic stenosis now being admitted with DKA   Met with pt in room today. Pt reports poor appetite for 4 weeks pta r/t poor appetite and nausea. Per chart, pt has lost 32lbs(14%) in 3 months. This is severe. Pt advanced to carbohydrate controlled diet today. Pt reports eating 75% of her lunch. Pt denies nausea and vomiting today. RD will order Glucerna. Pt with low potassium and phosphorus today. Pharmacy consulted for electrolyte monitoring. RD will continue to monitor.   Medications reviewed and include: lovenox, KCl, NaCl with dextrose, insulin, zofran  Labs reviewed: K 2.3(L), Co2 18(L), BUN 5(L), Ca 8.6(L), P < 1.0(L), Mg 1.8 wnl Wbc- 23.1(H), hgb 16.1(H), Hct 47.6(H) cbgs- 443, 330, 264, 210, 182 x 24 hrs AIC- 6.7- 07/17/2016  Nutrition-Focused physical exam completed. Findings are no fat depletion, no muscle depletion, and no edema.   Diet Order:  Diet Carb Modified Fluid consistency: Thin; Room service appropriate? Yes  Skin:  Reviewed, no issues  Last BM:  1/19  Height:   Ht Readings from Last 1 Encounters:  12/05/16 '4\' 11"'$  (1.499 m)    Weight:   Wt Readings from Last 1 Encounters:  12/05/16 197 lb 5 oz (89.5 kg)    Ideal Body Weight:  44.5 kg  BMI:  Body mass index is 39.85 kg/m.  Estimated Nutritional Needs:    Kcal:  1600-1900kcal/day   Protein:  90-107g/day   Fluid:  >1.6L/day   EDUCATION NEEDS:   No education needs identified at this time  Koleen Distance, RD, LDN Pager #[phone]

## 2016-12-06 NOTE — Progress Notes (Signed)
Called report to Marijean Niemann, RN 1C.  Pt. Transferred in wheelchair by NT with belongings, IVF and electrolytes running. VSS, Pt. A & Ox4, room air.

## 2016-12-07 DIAGNOSIS — E440 Moderate protein-calorie malnutrition: Secondary | ICD-10-CM | POA: Insufficient documentation

## 2016-12-07 LAB — BASIC METABOLIC PANEL
Anion gap: 10 (ref 5–15)
Anion gap: 9 (ref 5–15)
BUN: 6 mg/dL (ref 6–20)
BUN: 8 mg/dL (ref 6–20)
CALCIUM: 8.5 mg/dL — AB (ref 8.9–10.3)
CO2: 21 mmol/L — ABNORMAL LOW (ref 22–32)
CO2: 23 mmol/L (ref 22–32)
CREATININE: 0.63 mg/dL (ref 0.44–1.00)
Calcium: 8.1 mg/dL — ABNORMAL LOW (ref 8.9–10.3)
Chloride: 106 mmol/L (ref 101–111)
Chloride: 102 mmol/L (ref 101–111)
Creatinine, Ser: 0.56 mg/dL (ref 0.44–1.00)
GFR calc Af Amer: >60 mL/min (ref >60)
GFR calc non Af Amer: >60 mL/min (ref >60)
Glucose, Bld: 244 mg/dL — ABNORMAL HIGH (ref 65–99)
Glucose, Bld: 355 mg/dL — ABNORMAL HIGH (ref 65–99)
POTASSIUM: 2.7 mmol/L — AB (ref 3.5–5.1)
Potassium: 3 mmol/L — ABNORMAL LOW (ref 3.5–5.1)
SODIUM: 137 mmol/L (ref 135–145)
Sodium: 134 mmol/L — ABNORMAL LOW (ref 135–145)

## 2016-12-07 LAB — GLUCOSE, CAPILLARY
GLUCOSE-CAPILLARY: 360 mg/dL — AB (ref 65–99)
GLUCOSE-CAPILLARY: 323 mg/dL — AB (ref 65–99)
Glucose-Capillary: 249 mg/dL — ABNORMAL HIGH (ref 65–99)

## 2016-12-07 LAB — POTASSIUM: Potassium: 2.6 mmol/L — CL (ref 3.5–5.1)

## 2016-12-07 LAB — PHOSPHORUS
PHOSPHORUS: 2.5 mg/dL (ref 2.5–4.6)
PHOSPHORUS: 2.2 mg/dL — AB (ref 2.5–4.6)
Phosphorus: 1.4 mg/dL — ABNORMAL LOW (ref 2.5–4.6)

## 2016-12-07 LAB — MAGNESIUM
MAGNESIUM: 2 mg/dL (ref 1.7–2.4)
Magnesium: 1.8 mg/dL (ref 1.7–2.4)

## 2016-12-07 MED ORDER — POTASSIUM CHLORIDE 20 MEQ PO PACK: 40.0000 meq | PACK | Freq: Every day | ORAL | Status: DC

## 2016-12-07 MED ORDER — INSULIN STARTER KIT- PEN NEEDLES (ENGLISH)
1.0000 | Freq: Once | Status: AC
  Administered 2016-12-07: 13:00:00 1
  Filled 2016-12-07: qty 1

## 2016-12-07 MED ORDER — POTASSIUM CHLORIDE CRYS ER 20 MEQ PO TBCR
60.0000 meq | EXTENDED_RELEASE_TABLET | Freq: Once | ORAL | Status: AC
  Administered 2016-12-07: 60 meq via ORAL
  Filled 2016-12-07: qty 3

## 2016-12-07 MED ORDER — POTASSIUM CHLORIDE CRYS ER 20 MEQ PO TBCR
40.0000 meq | EXTENDED_RELEASE_TABLET | Freq: Once | ORAL | Status: AC
  Administered 2016-12-07: 40 meq via ORAL
  Filled 2016-12-07: qty 2

## 2016-12-07 MED ORDER — POTASSIUM CHLORIDE 20 MEQ PO PACK: 40.0000 meq | PACK | Freq: Every day | ORAL | 0 refills | Status: DC

## 2016-12-07 MED ORDER — INSULIN ASPART 100 UNIT/ML ~~LOC~~ SOLN: 4.0000 [IU] | Freq: Three times a day (TID) | SUBCUTANEOUS | 11 refills | Status: DC

## 2016-12-07 MED ORDER — POTASSIUM PHOSPHATES 15 MMOLE/5ML IV SOLN
30.0000 mmol | Freq: Once | INTRAVENOUS | Status: AC
  Administered 2016-12-07: 04:00:00 30 mmol via INTRAVENOUS
  Filled 2016-12-07: qty 10

## 2016-12-07 MED ORDER — INSULIN ASPART 100 UNIT/ML ~~LOC~~ SOLN: 4.0000 [IU] | Freq: Three times a day (TID) | SUBCUTANEOUS | Status: DC

## 2016-12-07 MED ORDER — SODIUM CHLORIDE 0.9 % IV SOLN
30.0000 meq | Freq: Once | INTRAVENOUS | Status: AC
  Administered 2016-12-07: 01:00:00 30 meq via INTRAVENOUS
  Filled 2016-12-07: qty 15

## 2016-12-07 MED ORDER — INSULIN GLARGINE 100 UNIT/ML ~~LOC~~ SOLN: 25.0000 [IU] | Freq: Every day | SUBCUTANEOUS | 11 refills | Status: DC

## 2016-12-07 NOTE — Progress Notes (Signed)
ELECTROLYTE CONSULT NOTE - INITIAL   Pharmacy Consult for electrolyte management Indication: DKA  No Known Allergies  Patient Measurements: Height: 4' 11" (149.9 cm) Weight: 197 lb 5 oz (89.5 kg) IBW/kg (Calculated) : 43.2  Vital Signs: Temp: 98.8 F (37.1 C) (01/22 0804) Temp Source: Oral (01/22 0804) BP: 132/89 (01/22 0804) Pulse Rate: 90 (01/22 0804) Intake/Output from previous day: 01/21 0701 - 01/22 0700 In: 2009.8 [P.O.:240; I.V.:484.8; IV Piggyback:1285] Out: 1700 [Urine:1700] Intake/Output from this shift: Total I/O In: 360 [P.O.:360] Out: -   Labs:  Recent Labs  12/05/16 1602  12/06/16 1200 12/06/16 1212 12/06/16 2333 12/07/16 0518 12/07/16 1213  WBC 23.1*  --   --   --   --   --   --   HGB 16.1*  --   --   --   --   --   --   HCT 47.6*  --   --   --   --   --   --   PLT 192  --   --   --   --   --   --   CREATININE 0.71  < > 0.57  --   --  0.56 0.63  MG  --   < >  --  1.8  --  2.0 1.8  PHOS  --   < > <1.0*  --  1.4* 2.5 2.2*  < > = values in this interval not displayed. Estimated Creatinine Clearance: 82.9 mL/min (by C-G formula based on SCr of 0.63 mg/dL).   Microbiology: Recent Results (from the past 720 hour(s))  MRSA PCR Screening     Status: None   Collection Time: 12/05/16 10:45 PM  Result Value Ref Range Status   MRSA by PCR NEGATIVE NEGATIVE Final    Comment:        The GeneXpert MRSA Assay (FDA approved for NASAL specimens only), is one component of a comprehensive MRSA colonization surveillance program. It is not intended to diagnose MRSA infection nor to guide or monitor treatment for MRSA infections.     Medical History: Past Medical History:  Diagnosis Date  . Diabetes mellitus without complication ([place])   . Hypertension     Medications:  Scheduled:  . amitriptyline  10 mg Oral TID  . budesonide  0.25 mg Nebulization BID  . diltiazem  120 mg Oral Daily  . enoxaparin (LOVENOX) injection  40 mg Subcutaneous QHS  .  feeding supplement (GLUCERNA SHAKE)  237 mL Oral TID BM  . fluticasone  2 spray Each Nare Daily  . insulin aspart  0-15 Units Subcutaneous TID WC  . insulin aspart  0-5 Units Subcutaneous QHS  . insulin aspart  4 Units Subcutaneous TID WC  . insulin glargine  25 Units Subcutaneous Daily  . insulin starter kit- pen needles  1 kit Other Once  . metFORMIN  500 mg Oral BID WC  . montelukast  10 mg Oral Daily  . potassium chloride  40 mEq Oral Once    Assessment: Patient initially admitted in ICU for DKA and hypokalemia. Patient has now been transitioned off of insulin drip.  Goal of Therapy: Electrolytes WNL K+ 3.5 - 5.0 Mg 1.8 - 2.2  Plan:  K = 3.0 this afternoon after repletion. Will give another 40 mEq PO KCl and recheck electrolytes with AM labs tomorrow.  Lenis Noon, PharmD, BCPS Clinical Pharmacist 12/07/2016

## 2016-12-07 NOTE — Accreditation Note (Signed)
Discharge paperwork reviewed with patient who verbalized understanding including new medications.  Education about types of insulin patient will be taking at home, how to inject insulin, type 2 DM and insulin started kit and multiple handouts reviewed with patient using teach back method. Patient shows confidence and understanding of how to check blood sugar and injecting insulin. Follow up appointment reviewed and emphasized importance since starting insulin and going home on daily potassium supplements.   Patient is stable for discharge. Patient's family to transport home.

## 2016-12-07 NOTE — Progress Notes (Signed)
Spoke with patient about the possibility of going home on insulin.  Patient told me that Dr. Posey Pronto already discussed this with her and she will be discharged home on both long-acting insulin once daily and quick-acting insulin with meals.  Discussed both Lantus and Novolog insulins with patient.  Explained what they are, how they work, when to take, etc.  Educated patient on insulin pen use at home.  Reviewed all steps of insulin pen including attachment of needle, 2-unit air shot, dialing up dose, giving injection, removing needle, disposal of sharps, storage of unused insulin, disposal of insulin etc.  Patient able to provide successful return demonstration.  Reviewed troubleshooting with insulin pen.  Also reviewed Signs/Symptoms of Hypoglycemia with patient and how to treat Hypoglycemia at home.    Have asked RNs caring for patient to please allow patient to give all injections here in hospital as much as possible for practice.    MD to give patient Rxs for insulin pens and insulin pen needles.       --Will follow patient during hospitalization--  Wyn Quaker RN, MSN, CDE Diabetes Coordinator Inpatient Glycemic Control Team Team Pager: [phone] (8a-5p)

## 2016-12-07 NOTE — Discharge Summary (Signed)
[name] NAME: Kelly Matthews    MR#:  [id number]  DATE OF BIRTH:  1966-06-17  DATE OF ADMISSION:  12/05/2016 ADMITTING PHYSICIAN: [name], DO  DATE OF DISCHARGE: 12/06/2016  PRIMARY CARE PHYSICIAN: [name], PA    ADMISSION DIAGNOSIS:  Diabetic ketoacidosis without coma associated with type 2 diabetes mellitus ([place]) [E13.10]  DISCHARGE DIAGNOSIS:  DKA-resolved Electrolyte abnormality DM-2  SECONDARY DIAGNOSIS:   Past Medical History:  Diagnosis Date  . Diabetes mellitus without complication ([place])   . Hypertension     HOSPITAL COURSE:   51 y.o.femalewith a history of DM, HTN, allergic rhinitis, migraines, fatty liver, aortic stenosisnow being admitted with:  1. Moderate DKA with multiple electrolyte abnormalities, possibly secondary to steroid use. No evidence of infection. Leukocytosis likely secondary to steroid use. - Aggressive IV fluid hydration -Patient now off IV insulin - Correct electrolytes pharmacy consulted Anion gap closed. -Changed to insulin Lantus 25 units daily and Novolog 4 units tid  2. Dehydration - Received IV fluids -Bicarbonate improving.  3. H/O HTN - Continue diltiazem  4. H/O allergic rhinitis - Continue Pulmicort, Flonase, Singulair  5. H/O Migraines - Continue Imitrex PRN  6. H/O cervical radiculiis - Continue Elavil, gabapentin  7. Hypokalemia hypophosphatemia-repleted Pharmacy consult for replacing electrolytes Po K dur as out pt for few days  D/c home Overall better CONSULTS OBTAINED:    DRUG ALLERGIES:  No Known Allergies  DISCHARGE MEDICATIONS:   Current Discharge Medication List    START taking these medications   Details  insulin aspart (NOVOLOG) 100 UNIT/ML injection Inject 4 Units into the skin 3 (three) times daily with meals. Qty: 10 mL, Refills: 11    insulin glargine (LANTUS) 100 UNIT/ML injection Inject  0.25 mLs (25 Units total) into the skin daily. Qty: 10 mL, Refills: 11    potassium chloride (KLOR-CON) 20 MEQ packet Take 40 mEq by mouth daily. Qty: 6 packet, Refills: 0      CONTINUE these medications which have NOT CHANGED   Details  amitriptyline (ELAVIL) 10 MG tablet TAKE 1 TABLET (10 MG TOTAL) BY MOUTH 3 (THREE) TIMES DAILY. Qty: 90 tablet, Refills: 3    budesonide (PULMICORT FLEXHALER) 180 MCG/ACT inhaler     diltiazem (DILACOR XR) 120 MG 24 hr capsule 1 capsule daily.    fluticasone (FLONASE) 50 MCG/ACT nasal spray SPRAY 2 SPRAYS IN EACH NOSTRIL DAILY Qty: 16 g, Refills: 5   Associated Diagnoses: Allergic rhinitis, unspecified allergic rhinitis type    metFORMIN (GLUCOPHAGE) 500 MG tablet TAKE 1 TABLET (500 MG TOTAL) BY MOUTH 2 (TWO) TIMES DAILY. Qty: 180 tablet, Refills: 3   Associated Diagnoses: Type 2 diabetes mellitus without complication, without long-term current use of insulin (HCC)    montelukast (SINGULAIR) 10 MG tablet TAKE 1 TABLET BY MOUTH EVERY DAY Qty: 30 tablet, Refills: 5   Associated Diagnoses: Mild intermittent asthma without complication    ondansetron (ZOFRAN) 4 MG tablet Take 1 tablet (4 mg total) by mouth every 6 (six) hours as needed for nausea or vomiting. Qty: 30 tablet, Refills: 2    SUMAtriptan (IMITREX) 50 MG tablet Take one tablet by mouth at onset of migraine. May repeat in 2 hours if headache persists or recurs. Limit 2 doses in 24 hours. Qty: 10 tablet, Refills: 0   Associated Diagnoses: Migraine without aura and without status migrainosus, not intractable    ONE TOUCH ULTRA TEST test strip USE 1 STRIP TWICE  DAILY AS NEEDED Qty: 200 each, Refills: 3    ONETOUCH DELICA LANCETS [id number] MISC USE TO CHECK BLOOD SUGAR TWICE A DAY Refills: 12        If you experience worsening of your admission symptoms, develop shortness of breath, life threatening emergency, suicidal or homicidal thoughts you must seek medical attention immediately by  calling 911 or calling your MD immediately  if symptoms less severe.  You Must read complete instructions/literature along with all the possible adverse reactions/side effects for all the Medicines you take and that have been prescribed to you. Take any new Medicines after you have completely understood and accept all the possible adverse reactions/side effects.   Please note  You were cared for by a hospitalist during your hospital stay. If you have any questions about your discharge medications or the care you received while you were in the hospital after you are discharged, you can call the unit and asked to speak with the hospitalist on call if the hospitalist that took care of you is not available. Once you are discharged, your primary care physician will handle any further medical issues. Please note that NO REFILLS for any discharge medications will be authorized once you are discharged, as it is imperative that you return to your primary care physician (or establish a relationship with a primary care physician if you do not have one) for your aftercare needs so that they can reassess your need for medications and monitor your lab values. Today   SUBJECTIVE   Doing well  VITAL SIGNS:  Blood pressure 125/64, pulse 94, temperature 97.7 F (36.5 C), temperature source Oral, resp. rate 18, height 4\' 11"  (1.499 m), weight 89.5 kg (197 lb 5 oz), SpO2 100 %.  I/O:   Intake/Output Summary (Last 24 hours) at 12/07/16 1550 Last data filed at 12/07/16 1344  Gross per 24 hour  Intake          1653.95 ml  Output              500 ml  Net          1153.95 ml    PHYSICAL EXAMINATION:  GENERAL:  51 y.o.-year-old patient lying in the bed with no acute distress.  EYES: Pupils equal, round, reactive to light and accommodation. No scleral icterus. Extraocular muscles intact.  HEENT: Head atraumatic, normocephalic. Oropharynx and nasopharynx clear.  NECK:  Supple, no jugular venous distention. No  thyroid enlargement, no tenderness.  LUNGS: Normal breath sounds bilaterally, no wheezing, rales,rhonchi or crepitation. No use of accessory muscles of respiration.  CARDIOVASCULAR: S1, S2 normal. No murmurs, rubs, or gallops.  ABDOMEN: Soft, non-tender, non-distended. Bowel sounds present. No organomegaly or mass.  EXTREMITIES: No pedal edema, cyanosis, or clubbing.  NEUROLOGIC: Cranial nerves II through XII are intact. Muscle strength 5/5 in all extremities. Sensation intact. Gait not checked.  PSYCHIATRIC: The patient is alert and oriented x 3.  SKIN: No obvious rash, lesion, or ulcer.   DATA REVIEW:   CBC   Recent Labs Lab 12/05/16 1602  WBC 23.1*  HGB 16.1*  HCT 47.6*  PLT 192    Chemistries   Recent Labs Lab 12/07/16 1213  NA 134*  K 3.0*  CL 102  CO2 23  GLUCOSE 355*  BUN 8  CREATININE 0.63  CALCIUM 8.5*  MG 1.8    Microbiology Results   Recent Results (from the past 240 hour(s))  MRSA PCR Screening     Status: None   Collection  Time: 12/05/16 10:45 PM  Result Value Ref Range Status   MRSA by PCR NEGATIVE NEGATIVE Final    Comment:        The GeneXpert MRSA Assay (FDA approved for NASAL specimens only), is one component of a comprehensive MRSA colonization surveillance program. It is not intended to diagnose MRSA infection nor to guide or monitor treatment for MRSA infections.     RADIOLOGY:  No results found.   Management plans discussed with the patient, family and they are in agreement.  CODE STATUS:     Code Status Orders        Start     Ordered   12/05/16 2239  Full code  Continuous     12/05/16 2238    Code Status History    Date Active Date Inactive Code Status Order ID Comments User Context   This patient has a current code status but no historical code status.      TOTAL TIME TAKING CARE OF THIS PATIENT:40 minutes.    Sotero Brinkmeyer M.D on 12/07/2016 at 3:50 PM  Between 7am to 6pm - Pager - [phone] After 6pm go to  [email or web address] - password [email or web address] [facility]  [facility]  Office  [phone]  CC: Primary care physician; [name], PA

## 2016-12-07 NOTE — Progress Notes (Signed)
ELECTROLYTE CONSULT NOTE - INITIAL   Pharmacy Consult for electrolyte management Indication: DKA  No Known Allergies  Patient Measurements: Height: 4\' 11"  (149.9 cm) Weight: 197 lb 5 oz (89.5 kg) IBW/kg (Calculated) : 43.2 Adjusted Body Weight:   Vital Signs: Temp: 97.9 F (36.6 C) (01/22 0100) Temp Source: Oral (01/22 0100) BP: 125/62 (01/22 0100) Pulse Rate: 93 (01/22 0100) Intake/Output from previous day: 01/21 0701 - 01/22 0700 In: 1234.8 [P.O.:240; I.V.:484.8; IV Piggyback:510] Out: 1700 [Urine:1700] Intake/Output from this shift: Total I/O In: -  Out: 500 [Urine:500]  Labs:  Recent Labs  12/05/16 1602 12/05/16 2038  12/06/16 0440 12/06/16 0814 12/06/16 1200 12/06/16 1212 12/06/16 2333  WBC 23.1*  --   --   --   --   --   --   --   HGB 16.1*  --   --   --   --   --   --   --   HCT 47.6*  --   --   --   --   --   --   --   PLT 192  --   --   --   --   --   --   --   CREATININE 0.71 0.79  < > 0.58 0.41* 0.57  --   --   MG  --  1.7  --   --   --   --  1.8  --   PHOS  --  1.5*  --   --   --  <1.0*  --  1.4*  < > = values in this interval not displayed. Estimated Creatinine Clearance: 82.9 mL/min (by C-G formula based on SCr of 0.57 mg/dL).   Microbiology: Recent Results (from the past 720 hour(s))  MRSA PCR Screening     Status: None   Collection Time: 12/05/16 10:45 PM  Result Value Ref Range Status   MRSA by PCR NEGATIVE NEGATIVE Final    Comment:        The GeneXpert MRSA Assay (FDA approved for NASAL specimens only), is one component of a comprehensive MRSA colonization surveillance program. It is not intended to diagnose MRSA infection nor to guide or monitor treatment for MRSA infections.     Medical History: Past Medical History:  Diagnosis Date  . Diabetes mellitus without complication ([place])   . Hypertension     Medications:  Scheduled:  . amitriptyline  10 mg Oral TID  . budesonide  0.25 mg Nebulization BID  . diltiazem  120 mg  Oral Daily  . enoxaparin (LOVENOX) injection  40 mg Subcutaneous QHS  . feeding supplement (GLUCERNA SHAKE)  237 mL Oral TID BM  . fluticasone  2 spray Each Nare Daily  . Influenza vac split quadrivalent PF  0.5 mL Intramuscular Tomorrow-1000  . insulin aspart  0-15 Units Subcutaneous TID WC  . insulin aspart  0-5 Units Subcutaneous QHS  . insulin aspart  4 Units Subcutaneous TID WC  . insulin glargine  25 Units Subcutaneous Daily  . metFORMIN  500 mg Oral BID WC  . montelukast  10 mg Oral Daily  . potassium chloride (KCL MULTIRUN) 30 mEq in 265 mL IVPB  30 mEq Intravenous Once  . potassium phosphate IVPB (mmol)  30 mmol Intravenous Once    Assessment: Patient admitted in ICU for DKA and is hypokalemic. Patient has received a total of 140 mEq of potassium (2 30 mEq IV and 2 40 mEq po). No magnesium level drawn,  will drawn mag level and assess, BMP also in process.  Goal of Therapy:  K+ 3.5 - 5.0 Mg 1.8 - 2.2  Plan:  Will monitor BMP and Mg level and replace as needed. Will monitor renal function.  1/21 2333 K 2.6, phos 1.4. Provider ordered potassium chloride 30 mEq IV x 1. Will also order potassium phosphate 30 mmol IV x 1 and recheck electrolytes when infusion completed.  Daltin Crist A. Jordan Hawks, PharmD, BCPS Clinical Pharmacist 12/07/2016

## 2016-12-07 NOTE — Progress Notes (Addendum)
Inpatient Diabetes Program Recommendations  AACE/ADA: New Consensus Statement on Inpatient Glycemic Control (2015)  Target Ranges:  Prepandial:   less than 140 mg/dL      Peak postprandial:   less than 180 mg/dL (1-2 hours)      Critically ill patients:  140 - 180 mg/dL   Results for SHEYNA, LEEDY (MRN [id number]) as of 12/07/2016 12:51  Ref. Range 12/07/2016 07:27 12/07/2016 11:45  Glucose-Capillary Latest Ref Range: 65 - 99 mg/dL 249 (H) 360 (H)     Admit with: DKA likely from steroid use  History: DM  Home DM Meds: Metformin 1000 mg AM/ 500 mg PM  Current Insulin Orders: Lantus 25 units daily      Novolog Moderate Correction Scale/ SSI (0-15 units) TID AC + HS      Novolog 4 units TID with meals      Metformin 500 mg BID      -Note patient transitioned off IV Insulin drip yesterday afternoon.  Was given 25 units Lantus at 4pm.    -Also started on Novolog SSI and Novolog meal coverage as well.  -Current A1c pending.  Of note, last A1c back in September was 6.7%.    MD- Please consider the following in-hospital insulin adjustments:  1. Increase Lantus to 30 units daily  2. Increase Novolog Meal Coverage to: Novolog 8 units TID with meals  3. Please make sure to give pt the following insulin Rxs at time of d/c:  Lantus insulin pen- Order # [id number]  Novolog insulin pen- Order # [id number]  Insulin Pen Needles- Order # [id number]     --Will follow patient during hospitalization--  Wyn Quaker RN, MSN, CDE Diabetes Coordinator Inpatient Glycemic Control Team Team Pager: [phone] (8a-5p)

## 2016-12-08 ENCOUNTER — Telehealth: Payer: Self-pay | Admitting: Family Medicine

## 2016-12-08 LAB — HEMOGLOBIN A1C
HEMOGLOBIN A1C: 11.8 % — AB (ref 4.8–5.6)
Mean Plasma Glucose: 292 mg/dL

## 2016-12-08 NOTE — Telephone Encounter (Signed)
Patient states she DID NOT get medication from the pharmacy due to cost. What should she do about blood sugars until appointment on 12/10/2016. Please advise.

## 2016-12-08 NOTE — Telephone Encounter (Signed)
Pt states she was at [facility] from Saturday until yesterday due to diabetes/sugar was really high.  The hospital doctor added a Rx insulin glargine (LANTUS) 100 UNIT/ML injection to her medication but this is not covered by insurance so she did get this from the pharmacy.  Pt states he blood sugar is still running around 350.  Pt is asking what she needs to do about the Rx that was not covered by insurance?  I have scheduled pt for an appointment on 12/10/2016@11 :00.     Pt contacted office for refill request on the following medications:  [facility]  CB#[phone]/MW  ONE TOUCH ULTRA TEST test strip   ONETOUCH DELICA LANCETS [id number] MISC

## 2016-12-08 NOTE — Telephone Encounter (Signed)
If she DID get the insulins as prescribed, will work on possible option if insurance not covering Lantus to change to at her appointment on 12-10-16. May need referral to an endocrinologist/diabetes specialist if still having difficulty with sugar control.

## 2016-12-08 NOTE — Telephone Encounter (Signed)
Try [employer] savings card for Lantus.

## 2016-12-08 NOTE — Telephone Encounter (Signed)
After reviewing [employer]'s insulin formulary all the insulins are a tier 3. Lantus savings discount card at the front desk for pick up. Patient is aware.

## 2016-12-09 NOTE — Telephone Encounter (Signed)
Patient was able to get the lantus.  Patient called saying that she has felt bad all day. She reports that she is really lethargic and has dizzy spells. She reports that she checked her blood sugar after breakfast, and it was 417. Patient reports that while she was in the hospital they advised her to take the Metformin 500mg  1 tablet in the am and 1 tablet in the pm. She reports that she was initially she was taking Metformin 500mg  2 tablets in the morning and 1 tablet in the evenings.   I advised the patient that Simona Huh was not in the office, and Dr. Rosanna Randy recommended that she increase Novolog to 8 units and Lantus to 30 units and to keep her appt tomorrow with Simona Huh. Patient verbalized understanding.

## 2016-12-10 ENCOUNTER — Encounter: Payer: Self-pay | Admitting: Family Medicine

## 2016-12-10 ENCOUNTER — Ambulatory Visit (INDEPENDENT_AMBULATORY_CARE_PROVIDER_SITE_OTHER): Payer: Managed Care, Other (non HMO) | Admitting: Family Medicine

## 2016-12-10 VITALS — BP 130/84 | HR 90 | Temp 97.5°F | Resp 18 | Wt 207.8 lb

## 2016-12-10 DIAGNOSIS — E876 Hypokalemia: Secondary | ICD-10-CM

## 2016-12-10 DIAGNOSIS — E1310 Other specified diabetes mellitus with ketoacidosis without coma: Secondary | ICD-10-CM | POA: Diagnosis not present

## 2016-12-10 DIAGNOSIS — E1110 Type 2 diabetes mellitus with ketoacidosis without coma: Secondary | ICD-10-CM

## 2016-12-10 MED ORDER — ONETOUCH ULTRA BLUE VI STRP: ORAL_STRIP | 3 refills | Status: DC

## 2016-12-10 MED ORDER — INSULIN SYRINGES (DISPOSABLE) U-100 1 ML MISC: 3 refills | Status: DC

## 2016-12-10 MED ORDER — INSULIN GLARGINE 100 UNIT/ML ~~LOC~~ SOLN: 36.0000 [IU] | Freq: Every day | SUBCUTANEOUS | 1 refills | Status: DC

## 2016-12-10 MED ORDER — INSULIN ASPART 100 UNIT/ML ~~LOC~~ SOLN: 8.0000 [IU] | Freq: Three times a day (TID) | SUBCUTANEOUS | 1 refills | Status: DC

## 2016-12-10 MED ORDER — ONETOUCH DELICA LANCETS 33G MISC: 3 refills | Status: DC

## 2016-12-10 NOTE — Progress Notes (Signed)
Patient: Kelly Matthews Female    DOB: [birth year]   51 y.o.   MRN: [id number] Visit Date: 12/10/2016  Today's Provider: [name], PA   Chief Complaint  Patient presents with  . Hospitalization Follow-up   Subjective:    HPI   Follow up Hospitalization  Patient was admitted to [facility] on 12/05/2016 and discharged on 12/07/2016. She was treated for Diabetic Ketoacidoses. Treatment for this included started Lantus 25 units, Novolog 4 units and Potassium 20 meq. Telephone follow up was done on 12/08/2016 patient called office to report blood sugars running high. Patient also called on 12/09/2016 to report high blood sugar reading. Per Dr. Rosanna Randy patient to increase Lantus to 30 units and increase Novolog to 8 units.  She reports good compliance with treatment. She reports this condition is Improved slightly. Patient reports blood sugar this morning was 392 before breakfast and 416 after using insulin and eating breakfast.  ------------------------------------------------------------------------------------       Previous Medications   AMITRIPTYLINE (ELAVIL) 10 MG TABLET    TAKE 1 TABLET (10 MG TOTAL) BY MOUTH 3 (THREE) TIMES DAILY.   BUDESONIDE (PULMICORT FLEXHALER) 180 MCG/ACT INHALER       DILTIAZEM (DILACOR XR) 120 MG 24 HR CAPSULE    1 capsule daily.   FLUTICASONE (FLONASE) 50 MCG/ACT NASAL SPRAY    SPRAY 2 SPRAYS IN EACH NOSTRIL DAILY   INSULIN ASPART (NOVOLOG) 100 UNIT/ML INJECTION    Inject 4 Units into the skin 3 (three) times daily with meals.   INSULIN GLARGINE (LANTUS) 100 UNIT/ML INJECTION    Inject 0.25 mLs (25 Units total) into the skin daily.   METFORMIN (GLUCOPHAGE) 500 MG TABLET    TAKE 1 TABLET (500 MG TOTAL) BY MOUTH 2 (TWO) TIMES DAILY.   MONTELUKAST (SINGULAIR) 10 MG TABLET    TAKE 1 TABLET BY MOUTH EVERY DAY   ONDANSETRON (ZOFRAN) 4 MG TABLET    Take 1 tablet (4 mg total) by mouth every 6 (six) hours as needed for nausea or vomiting.   ONE TOUCH  ULTRA TEST TEST STRIP    USE 1 STRIP TWICE DAILY AS NEEDED   ONETOUCH DELICA LANCETS 99991111 MISC    USE TO CHECK BLOOD SUGAR TWICE A DAY   POTASSIUM CHLORIDE (KLOR-CON) 20 MEQ PACKET    Take 40 mEq by mouth daily.   SUMATRIPTAN (IMITREX) 50 MG TABLET    Take one tablet by mouth at onset of migraine. May repeat in 2 hours if headache persists or recurs. Limit 2 doses in 24 hours.    Review of Systems  Constitutional: Positive for fatigue.  Respiratory: Negative.   Cardiovascular: Negative.   Endocrine: Negative.   Musculoskeletal: Negative.     Social History  Substance Use Topics  . Smoking status: Former Smoker    Years: 1.00  . Smokeless tobacco: Never Used  . Alcohol use No     Comment: occasionally   Objective:   BP 130/84 (BP Location: Right Arm, Patient Position: Sitting, Cuff Size: Normal)   Pulse 90   Temp 97.5 F (36.4 C) (Oral)   Resp 18   Wt 207 lb 12.8 oz (94.3 kg)   SpO2 99%   BMI 41.97 kg/m   Physical Exam  Constitutional: She is oriented to person, place, and time. She appears well-developed and well-nourished.  HENT:  Head: Normocephalic.  Eyes: Conjunctivae are normal.  Neck: Neck supple.  Cardiovascular: Normal rate and regular rhythm.   Pulmonary/Chest: Breath sounds normal.  Abdominal: Soft.  Neurological: She is alert and oriented to person, place, and time.  Psychiatric: She has a normal mood and affect. Her behavior is normal.      Assessment & Plan:     1. Diabetic ketoacidosis without coma associated with type 2 diabetes mellitus ([place]) Feels the DKA occurred because she was placed on a 12 day steroid taper 11-27-16. Halfway through the taper she develped nausea and a sensation of feeling "spacey". She went to the ER on 12-05-16 and was having polyuria, polydipsia, weight loss (35 lbs in a couple weeks) and glucose was 443. Hgb A1C was 11.8% on 12-06-16. Still having FBS 392 today despite increasing Lantus to 30 units and Novolog 8 units before  meals. Will increase Lantus to 36 units daily and continue Novolog 8 units TID. If blood sugar before meals below 100, don't take the Novolog for that meal. Recheck in 1 week for further adjustments in regimen. If having persistent difficulties controlling diabetes, may refer to an endocrinologist. - ONETOUCH DELICA LANCETS 99991111 MISC; TEST BLOOD SUGAR THREE TIMES A DAY TO ADJUST INSULIN DOSAGE  Dispense: 300 each; Refill: 3 - ONE TOUCH ULTRA TEST test strip; USE 1 STRIP THREE TIMES DAILY AS NEEDED TO ADJUST INSULIN DOSAGE  Dispense: 300 each; Refill: 3 - insulin glargine (LANTUS) 100 UNIT/ML injection; Inject 0.36 mLs (36 Units total) into the skin daily.  Dispense: 10 mL; Refill: 1 - insulin aspart (NOVOLOG) 100 UNIT/ML injection; Inject 8 Units into the skin 3 (three) times daily with meals.  Dispense: 10 mL; Refill: 1 - Insulin Syringes, Disposable, U-100 1 ML MISC; USE ONE SYRINGE FOR EACH DOSAGE (NOVOLOG THREE TIMES A DAY AND LANTUS ONCE A DAY)  Dispense: 100 each; Refill: 3 - Comprehensive metabolic panel - CBC with Differential/Platelet  2. Hypokalemia Potassium was low in the hospital (2.7) and back up at discharge (3.0). Unable to tolerate KCL by powder. Will check blood levels and may have to try the tablets. - Comprehensive metabolic panel - CBC with Differential/Platelet

## 2016-12-11 ENCOUNTER — Telehealth: Payer: Self-pay | Admitting: Family Medicine

## 2016-12-11 LAB — COMPREHENSIVE METABOLIC PANEL
ALBUMIN: 4.2 g/dL (ref 3.5–5.5)
ALT: 56 IU/L — ABNORMAL HIGH (ref 0–32)
AST: 28 IU/L (ref 0–40)
Albumin/Globulin Ratio: 2 (ref 1.2–2.2)
Alkaline Phosphatase: 125 IU/L — ABNORMAL HIGH (ref 39–117)
BILIRUBIN TOTAL: 0.3 mg/dL (ref 0.0–1.2)
BUN / CREAT RATIO: 20 (ref 9–23)
BUN: 11 mg/dL (ref 6–24)
CHLORIDE: 90 mmol/L — AB (ref 96–106)
CO2: 30 mmol/L — ABNORMAL HIGH (ref 18–29)
Calcium: 10 mg/dL (ref 8.7–10.2)
Creatinine, Ser: 0.56 mg/dL — ABNORMAL LOW (ref 0.57–1.00)
GFR calc Af Amer: 127 mL/min/{1.73_m2} (ref >59)
GFR calc non Af Amer: 110 mL/min/{1.73_m2} (ref >59)
GLOBULIN, TOTAL: 2.1 g/dL (ref 1.5–4.5)
Glucose: 351 mg/dL — ABNORMAL HIGH (ref 65–99)
POTASSIUM: 3.3 mmol/L — AB (ref 3.5–5.2)
SODIUM: 138 mmol/L (ref 134–144)
Total Protein: 6.3 g/dL (ref 6.0–8.5)

## 2016-12-11 LAB — CBC WITH DIFFERENTIAL/PLATELET
BASOS ABS: 0 10*3/uL (ref 0.0–0.2)
Basos: 0 %
EOS (ABSOLUTE): 0 10*3/uL (ref 0.0–0.4)
Eos: 0 %
HEMATOCRIT: 39.1 % (ref 34.0–46.6)
Hemoglobin: 12.9 g/dL (ref 11.1–15.9)
Immature Grans (Abs): 0.1 10*3/uL (ref 0.0–0.1)
Immature Granulocytes: 1 %
LYMPHS ABS: 1.6 10*3/uL (ref 0.7–3.1)
Lymphs: 15 %
MCH: 28.1 pg (ref 26.6–33.0)
MCHC: 33 g/dL (ref 31.5–35.7)
MCV: 85 fL (ref 79–97)
MONOS ABS: 0.8 10*3/uL (ref 0.1–0.9)
Monocytes: 7 %
Neutrophils Absolute: 8.1 10*3/uL — ABNORMAL HIGH (ref 1.4–7.0)
Neutrophils: 77 %
Platelets: 130 10*3/uL — ABNORMAL LOW (ref 150–379)
RBC: 4.59 x10E6/uL (ref 3.77–5.28)
RDW: 14.3 % (ref 12.3–15.4)
WBC: 10.6 10*3/uL (ref 3.4–10.8)

## 2016-12-11 MED ORDER — POTASSIUM CHLORIDE CRYS ER 20 MEQ PO TBCR: 20.0000 meq | EXTENDED_RELEASE_TABLET | Freq: Two times a day (BID) | ORAL | 0 refills | Status: DC

## 2016-12-11 NOTE — Telephone Encounter (Signed)
Pt is calling to request lab results.  CB#[phone]/MW

## 2016-12-11 NOTE — Telephone Encounter (Signed)
-----   Message from [place], [place] sent at 12/11/2016 11:05 AM EST ----- Blood sugar very high as it was at home. Potassium level still low and need KCL 20 meq one BID #60. Blood cell counts back to normal with platelets a little low. Keep appointment next week.

## 2016-12-14 ENCOUNTER — Telehealth: Payer: Self-pay | Admitting: Family Medicine

## 2016-12-14 NOTE — Telephone Encounter (Signed)
Prefer she increase the Lantus to 40 units daily instead of having to increase short acting Novolog. Keep the appointment as scheduled this week.

## 2016-12-14 NOTE — Telephone Encounter (Signed)
Pt advised. Agrees with treatment plan. Renaldo Fiddler, CMA

## 2016-12-14 NOTE — Telephone Encounter (Signed)
Pt states her glucose reading is 406 this morning.  Pt has increased the novolog to 12.  Pt is requesting a call back.  CB#[phone]/MW

## 2016-12-16 ENCOUNTER — Other Ambulatory Visit: Payer: Self-pay

## 2016-12-17 ENCOUNTER — Encounter: Payer: Self-pay | Admitting: Family Medicine

## 2016-12-17 ENCOUNTER — Ambulatory Visit (INDEPENDENT_AMBULATORY_CARE_PROVIDER_SITE_OTHER): Payer: Managed Care, Other (non HMO) | Admitting: Family Medicine

## 2016-12-17 VITALS — BP 122/76 | HR 99 | Temp 98.8°F | Resp 16 | Wt 207.6 lb

## 2016-12-17 DIAGNOSIS — E876 Hypokalemia: Secondary | ICD-10-CM | POA: Diagnosis not present

## 2016-12-17 DIAGNOSIS — Z794 Long term (current) use of insulin: Secondary | ICD-10-CM | POA: Diagnosis not present

## 2016-12-17 DIAGNOSIS — E118 Type 2 diabetes mellitus with unspecified complications: Secondary | ICD-10-CM

## 2016-12-17 DIAGNOSIS — M50123 Cervical disc disorder at C6-C7 level with radiculopathy: Secondary | ICD-10-CM

## 2016-12-17 DIAGNOSIS — M5020 Other cervical disc displacement, unspecified cervical region: Secondary | ICD-10-CM

## 2016-12-17 DIAGNOSIS — E1110 Type 2 diabetes mellitus with ketoacidosis without coma: Secondary | ICD-10-CM

## 2016-12-17 DIAGNOSIS — E1310 Other specified diabetes mellitus with ketoacidosis without coma: Secondary | ICD-10-CM | POA: Diagnosis not present

## 2016-12-17 HISTORY — DX: Other cervical disc displacement, unspecified cervical region: M50.20

## 2016-12-17 MED ORDER — INSULIN GLARGINE 100 UNIT/ML ~~LOC~~ SOLN: 58.0000 [IU] | Freq: Every day | SUBCUTANEOUS | 1 refills | Status: DC

## 2016-12-17 MED ORDER — INSULIN ASPART 100 UNIT/ML ~~LOC~~ SOLN: SUBCUTANEOUS | 1 refills | Status: DC

## 2016-12-17 NOTE — Progress Notes (Signed)
Patient: Kelly Matthews Female    DOB: May 02, 1966   51 y.o.   MRN: [id number] Visit Date: 12/17/2016  Today's Provider: [name], PA   Chief Complaint  Patient presents with  . Diabetes  . Follow-up   Subjective:    HPI  Diabetes Mellitus Type II, Follow-up:   Lab Results  Component Value Date   HGBA1C 11.8 (H) 12/06/2016   HGBA1C 6.7 (H) 07/17/2016   HGBA1C 6.9 (H) 11/22/2015    Last seen for diabetes 1 weeks ago.  Management since then includes advised patient to increase Lantus to 36 units and continue Novolog 8 units at OV on 12/10/2016. Patient called back on 12/14/2016 to report FBS of 406. Advised patient to increase Lantus to 40 units. She reports good compliance with treatment. She is not having side effects.  Current symptoms include fatigue  Home blood sugar records: fasting range: 297 this morning  Episodes of hypoglycemia? no   Current Insulin Regimen: Lantus and Novolog Weight trend: stable   Pertinent Labs:    Component Value Date/Time   CHOL 194 07/17/2016 0827   TRIG 75 07/17/2016 0827   HDL 44 07/17/2016 0827   LDLCALC 135 (H) 07/17/2016 0827   CREATININE 0.56 (L) 12/10/2016 1222    Wt Readings from Last 3 Encounters:  12/17/16 207 lb 9.6 oz (94.2 kg)  12/10/16 207 lb 12.8 oz (94.3 kg)  12/05/16 197 lb 5 oz (89.5 kg)    ------------------------------------------------------------------------    Previous Medications   AMITRIPTYLINE (ELAVIL) 10 MG TABLET    TAKE 1 TABLET (10 MG TOTAL) BY MOUTH 3 (THREE) TIMES DAILY.   B-D INS SYR ULTRAFINE 1CC/31G 31G X 5/16" 1 ML MISC    USE ONE SYRINGE FOR EACH DOSAGE (NOVOLOG THREE TIMES A DAY AND LANTUS ONCE A DAY)   BUDESONIDE (PULMICORT FLEXHALER) 180 MCG/ACT INHALER       DILTIAZEM (DILACOR XR) 120 MG 24 HR CAPSULE    1 capsule daily.   FLUTICASONE (FLONASE) 50 MCG/ACT NASAL SPRAY    SPRAY 2 SPRAYS IN EACH NOSTRIL DAILY   INSULIN ASPART (NOVOLOG) 100 UNIT/ML INJECTION    Inject 8 Units  into the skin 3 (three) times daily with meals.   INSULIN GLARGINE (LANTUS) 100 UNIT/ML INJECTION    Inject 0.36 mLs (36 Units total) into the skin daily.   INSULIN SYRINGES, DISPOSABLE, U-100 1 ML MISC    USE ONE SYRINGE FOR EACH DOSAGE (NOVOLOG THREE TIMES A DAY AND LANTUS ONCE A DAY)   METFORMIN (GLUCOPHAGE) 500 MG TABLET    TAKE 1 TABLET (500 MG TOTAL) BY MOUTH 2 (TWO) TIMES DAILY.   MONTELUKAST (SINGULAIR) 10 MG TABLET    TAKE 1 TABLET BY MOUTH EVERY DAY   ONDANSETRON (ZOFRAN) 4 MG TABLET    Take 1 tablet (4 mg total) by mouth every 6 (six) hours as needed for nausea or vomiting.   ONE TOUCH ULTRA TEST TEST STRIP    USE 1 STRIP THREE TIMES DAILY AS NEEDED TO ADJUST INSULIN DOSAGE   ONETOUCH DELICA LANCETS 99991111 MISC    TEST BLOOD SUGAR THREE TIMES A DAY TO ADJUST INSULIN DOSAGE   POTASSIUM CHLORIDE (KLOR-CON) 20 MEQ PACKET    Take 40 mEq by mouth daily.   POTASSIUM CHLORIDE SA (K-DUR,KLOR-CON) 20 MEQ TABLET    Take 1 tablet (20 mEq total) by mouth 2 (two) times daily.   SUMATRIPTAN (IMITREX) 50 MG TABLET    Take one tablet by mouth at onset  of migraine. May repeat in 2 hours if headache persists or recurs. Limit 2 doses in 24 hours.    Review of Systems  Constitutional: Positive for fatigue.  Respiratory: Negative.   Cardiovascular: Negative.   Endocrine: Negative.     Social History  Substance Use Topics  . Smoking status: Former Smoker    Years: 1.00  . Smokeless tobacco: Never Used  . Alcohol use No     Comment: occasionally   Objective:   BP 122/76 (BP Location: Right Arm, Patient Position: Sitting, Cuff Size: Normal)   Pulse 99   Temp 98.8 F (37.1 C) (Oral)   Resp 16   Wt 207 lb 9.6 oz (94.2 kg)   SpO2 96%   BMI 41.93 kg/m   Physical Exam  Constitutional: She is oriented to person, place, and time. She appears well-developed and well-nourished. No distress.  HENT:  Head: Normocephalic and atraumatic.  Right Ear: Hearing normal.  Left Ear: Hearing normal.  Nose:  Nose normal.  Eyes: Conjunctivae and lids are normal. Right eye exhibits no discharge. Left eye exhibits no discharge. No scleral icterus.  Neck: Neck supple.  Cardiovascular: Normal rate and regular rhythm.   Pulmonary/Chest: Effort normal and breath sounds normal. No respiratory distress.  Abdominal: Soft. Bowel sounds are normal.  Musculoskeletal:  Neck pain with ROM and radiation into the left arm. Some numbness in the left index finger.  Neurological: She is alert and oriented to person, place, and time.  Skin: Skin is intact. No lesion and no rash noted.  Psychiatric: She has a normal mood and affect. Her speech is normal and behavior is normal. Thought content normal.      Assessment & Plan:     1. Type 2 diabetes mellitus with complication, with long-term current use of insulin (HCC) Uncontrolled despite increase of Lantus to 56 units daily and Novolog 8 units with meals TID. Still has some fatigue and blurring of vision. Will change Novolog to a sliding scale and increase Lantus to 58 units daily. Schedule endocrinology referral. - Ambulatory referral to Endocrinology  2. 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 Sharlet Salina) has her on narcotic analgesics and had her evaluated by a neurosurgeon (Dr. Aris Lot) for C6-7 HNP with radiculopathy into the left arm. He feels she will need cervical laminectomy with fusion but won't be able to accomplish until diabetes is controlled and she is cleared by a cardiologist. Still unable to work until sugar controlled and neck pain controlled. Not using any more steroids at this time as this was the trigger for her diabetic ketoacidosis. Suspect she will be out of work for 4-6 weeks pending endocrinology and neurosurgical recommendations.  3. Hypokalemia On 12-10-16 potassium was 3.3 and started on KCL 20 meq BID. Recheck in 2-3 weeks.  4. Diabetic ketoacidosis without coma associated with type 2 diabetes mellitus  ([place]) Uncontrolled diabetes with before meals sugars ranging from 290-400. FBS 297 this morning and she took 56 units of Lantus. Will increase to 58 units and place Novolog on a sliding scale. Schedule referral to endocrinologist and recheck in 2 weeks. - insulin glargine (LANTUS) 100 UNIT/ML injection; Inject 0.58 mLs (58 Units total) into the skin daily.  Dispense: 10 mL; Refill: 1 - insulin aspart (NOVOLOG) 100 UNIT/ML injection; Check blood sugar before meals and take 4 units if glucose 151-200, 8 units if 201-250, 10 units if 251-300 and 12 units if >300.  Dispense: 10 mL; Refill: 1

## 2016-12-17 NOTE — Addendum Note (Signed)
Addended by: Jules Schick on: 12/17/2016 01:05 PM   Modules accepted: Orders

## 2016-12-21 ENCOUNTER — Telehealth: Payer: Self-pay | Admitting: Family Medicine

## 2016-12-21 NOTE — Telephone Encounter (Signed)
Completed forms until follow up appointment on 12-25-16 to see if diabetes getting under control. Make further determination at that time.

## 2016-12-21 NOTE — Telephone Encounter (Signed)
Pt called saying she has been out of medical leave for her back and diabetes.  She said Simona Huh has her out until Feb 9th but she is saying she needs to still be out longer.  She broght papers by today to be completed to be out longer.    Pt's call back is [phone]  Thanks Con Memos

## 2016-12-22 ENCOUNTER — Telehealth: Payer: Self-pay | Admitting: Family Medicine

## 2016-12-22 NOTE — Telephone Encounter (Signed)
LMTCB, Patient needs to schedule a follow up appointment on the 9th for further determination for FMLA per Simona Huh.

## 2016-12-22 NOTE — Telephone Encounter (Signed)
Pt states that her insulin has been running 250-256 over last several days.She wants to know if you feel any changes need to be made with her medication before she has office visit here on 12/28/16.If there is adjustments to medications [facility] [place]

## 2016-12-22 NOTE — Telephone Encounter (Signed)
Increase Lantus to 60 units (may divide to 30 units BID) and increase Novolog before meals to 6 units if glucose 150-200, 10 units if 201-250, 12 units if 251-300 and 14 units if >300. Keep appointment as scheduled.

## 2016-12-23 NOTE — Telephone Encounter (Signed)
Patient advised.

## 2016-12-25 ENCOUNTER — Encounter: Payer: Self-pay | Admitting: Family Medicine

## 2016-12-25 ENCOUNTER — Ambulatory Visit (INDEPENDENT_AMBULATORY_CARE_PROVIDER_SITE_OTHER): Payer: Managed Care, Other (non HMO) | Admitting: Family Medicine

## 2016-12-25 VITALS — BP 132/88 | HR 94 | Temp 97.8°F | Resp 18 | Wt 213.4 lb

## 2016-12-25 DIAGNOSIS — E876 Hypokalemia: Secondary | ICD-10-CM

## 2016-12-25 DIAGNOSIS — E118 Type 2 diabetes mellitus with unspecified complications: Secondary | ICD-10-CM

## 2016-12-25 DIAGNOSIS — M5412 Radiculopathy, cervical region: Secondary | ICD-10-CM | POA: Diagnosis not present

## 2016-12-25 DIAGNOSIS — Z794 Long term (current) use of insulin: Secondary | ICD-10-CM | POA: Diagnosis not present

## 2016-12-25 MED ORDER — INSULIN PEN NEEDLE 31G X 5 MM MISC: 3 refills | Status: DC

## 2016-12-25 MED ORDER — INSULIN GLARGINE 300 UNIT/ML ~~LOC~~ SOPN: 60.0000 [IU] | PEN_INJECTOR | Freq: Every day | SUBCUTANEOUS | 6 refills | Status: DC

## 2016-12-25 MED ORDER — INSULIN ASPART 100 UNIT/ML CARTRIDGE (PENFILL): SUBCUTANEOUS | 6 refills | Status: DC

## 2016-12-25 NOTE — Progress Notes (Signed)
Patient: Kelly Matthews Female    DOB: 1965/12/30   51 y.o.   MRN: [id number] Visit Date: 12/25/2016  Today's Provider: [name], PA   Chief Complaint  Patient presents with  . Diabetes  . Follow-up   Subjective:    HPI  Diabetes Mellitus Type II, Follow-up:   Lab Results  Component Value Date   HGBA1C 11.8 (H) 12/06/2016   HGBA1C 6.7 (H) 07/17/2016   HGBA1C 6.9 (H) 11/22/2015    Last seen for diabetes 1 weeks ago.  Management since then includes Increase Lantus to 60 units (may divide to 30 units BID) and increase Novolog before meals to 6 units if glucose 150-200, 10 units if 201-250, 12 units if 251-300 and 14 units if >300. Marland Kitchen She reports good compliance with treatment. She is not having side effects. Current symptoms include fatigue  Home blood sugar records: fasting range: 178 this morning. Under 200 the past couple mornings  Episodes of hypoglycemia? no   Current Insulin Regimen: Lantus and Novolog Weight trend: stable   Pertinent Labs:    Component Value Date/Time   CHOL 194 07/17/2016 0827   TRIG 75 07/17/2016 0827   HDL 44 07/17/2016 0827   LDLCALC 135 (H) 07/17/2016 0827   CREATININE 0.56 (L) 12/10/2016 1222    Wt Readings from Last 3 Encounters:  12/25/16 213 lb 6.4 oz (96.8 kg)  12/17/16 207 lb 9.6 oz (94.2 kg)  12/10/16 207 lb 12.8 oz (94.3 kg)    ------------------------------------------------------------------------ Patient Active Problem List   Diagnosis Date Noted  . Malnutrition of moderate degree 12/07/2016  . DKA (diabetic ketoacidoses) ([place]) 12/05/2016  . Migraine headache 05/05/2016  . Allergic rhinitis 07/23/2015  . Excess, menstruation 07/19/2015  . 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 Blood glucose elevated 07/19/2015  . Airway hyperreactivity 07/19/2015  . Disorder of esophagus 07/19/2015  . Hypercholesterolemia without hypertriglyceridemia 07/19/2015  . Borderline diabetes 07/19/2015  . Diabetes ([place])  11/28/2014  . Essential (primary) hypertension 11/28/2014  . Aortic heart valve narrowing 11/28/2014  . 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 Supraventricular tachycardia ([place]) 11/28/2014  . 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 BREAST BIOPSY Left 04/18/2015   Benign calcification  . CESAREAN SECTION  1990,2001  . INTRAUTERINE DEVICE INSERTION  October 2015   [facility]   Family History  Problem Relation Age of Onset  . Coronary artery disease Mother   . Coronary artery disease Father   . Diabetes Father   . Emphysema Father   . Seizures Daughter   . Cancer Maternal Grandmother   . Breast cancer Neg Hx    No Known Allergies   Previous Medications   AMITRIPTYLINE (ELAVIL) 10 MG TABLET    TAKE 1 TABLET (10 MG TOTAL) BY MOUTH 3 (THREE) TIMES DAILY.   B-D INS SYR ULTRAFINE 1CC/31G 31G X 5/16" 1 ML MISC    USE ONE SYRINGE FOR EACH DOSAGE (NOVOLOG THREE TIMES A DAY AND LANTUS ONCE A DAY)   BUDESONIDE (PULMICORT FLEXHALER) 180 MCG/ACT INHALER       DILTIAZEM (DILACOR XR) 120 MG 24 HR CAPSULE    1 capsule daily.   FLUTICASONE (FLONASE) 50 MCG/ACT NASAL SPRAY    SPRAY 2 SPRAYS IN EACH NOSTRIL DAILY   INSULIN ASPART (NOVOLOG) 100 UNIT/ML INJECTION    Check blood sugar before meals and take 4 units if glucose 151-200, 8 units if 201-250, 10  units if 251-300 and 12 units if >300.   INSULIN GLARGINE (LANTUS) 100 UNIT/ML INJECTION    Inject 0.58 mLs (58 Units total) into the skin daily.   INSULIN SYRINGES, DISPOSABLE, U-100 1 ML MISC    USE ONE SYRINGE FOR EACH DOSAGE (NOVOLOG THREE TIMES A DAY AND LANTUS ONCE A DAY)   METFORMIN (GLUCOPHAGE) 500 MG TABLET    TAKE 1 TABLET (500 MG TOTAL) BY MOUTH 2 (TWO) TIMES DAILY.   MONTELUKAST (SINGULAIR) 10 MG TABLET    TAKE 1 TABLET BY MOUTH EVERY DAY   ONDANSETRON (ZOFRAN) 4 MG TABLET    Take 1 tablet (4 mg total) by mouth every 6 (six) hours as needed for nausea  or vomiting.   ONE TOUCH ULTRA TEST TEST STRIP    USE 1 STRIP THREE TIMES DAILY AS NEEDED TO ADJUST INSULIN DOSAGE   ONETOUCH DELICA LANCETS 99991111 MISC    TEST BLOOD SUGAR THREE TIMES A DAY TO ADJUST INSULIN DOSAGE   POTASSIUM CHLORIDE (KLOR-CON) 20 MEQ PACKET    Take 40 mEq by mouth daily.   POTASSIUM CHLORIDE SA (K-DUR,KLOR-CON) 20 MEQ TABLET    Take 1 tablet (20 mEq total) by mouth 2 (two) times daily.   SUMATRIPTAN (IMITREX) 50 MG TABLET    Take one tablet by mouth at onset of migraine. May repeat in 2 hours if headache persists or recurs. Limit 2 doses in 24 hours.    Review of Systems  Constitutional: Positive for fatigue.  Respiratory: Negative.   Cardiovascular: Negative.     Social History  Substance Use Topics  . Smoking status: Former Smoker    Years: 1.00  . Smokeless tobacco: Never Used  . Alcohol use No     Comment: occasionally   Objective:   BP 132/88 (BP Location: Right Arm, Patient Position: Sitting, Cuff Size: Normal)   Pulse 94   Temp 97.8 F (36.6 C) (Oral)   Resp 18   Wt 213 lb 6.4 oz (96.8 kg)   SpO2 98%   BMI 43.10 kg/m   Physical Exam  Constitutional: She is oriented to person, place, and time. She appears well-developed and well-nourished. No distress.  HENT:  Head: Normocephalic and atraumatic.  Right Ear: Hearing normal.  Left Ear: Hearing normal.  Nose: Nose normal.  Eyes: Conjunctivae and lids are normal. Right eye exhibits no discharge. Left eye exhibits no discharge. No scleral icterus.  Neck:  Posterior neck pain radiating into arms (L>R).  Cardiovascular: Normal rate and regular rhythm.   Pulmonary/Chest: Effort normal. No respiratory distress.  Abdominal: Soft. Bowel sounds are normal.  Neurological: She is alert and oriented to person, place, and time.  Skin: Skin is intact. No lesion and no rash noted.  Psychiatric: She has a normal mood and affect. Her speech is normal and behavior is normal. Thought content normal.        Assessment & Plan:     1. Type 2 diabetes mellitus with complication, with long-term current use of insulin (HCC) Improved FBS to 174 yesterday and 178 today. Some bruising at injection sides. Will change Lantus to [place] 60 units daily to reduce volume of injection. Try to get Novolog pen and continue sliding scale (6 units if glucose 150-200, 10 units if 201-250, 12 units if 251-300 and 14 units if >300) with 31 gauge 5 mm needles. Has an appointment to see the endocrinologist on 01-18-17. Will follow up in this office in 2 weeks to see if we can give  medical clearance for neck surgery.  2. Hypokalemia Still taking KCL 20 meq BID. No muscle weakness, palpitations or muscle cramps. Last K+ level was 3.3 on 12-10-16. Will recheck levels today.  3. Cervical radiculopathy Bilateral neck and arm pains due to large disc protrusion at C6/7 causing significant canal and foraminal narrowing. Dr. Aris Lot (neurosurgeon) planning surgery when cardiologist gives clearance and diabetes well controlled.

## 2016-12-26 LAB — COMPREHENSIVE METABOLIC PANEL
ALBUMIN: 4.2 g/dL (ref 3.5–5.5)
ALK PHOS: 124 IU/L — AB (ref 39–117)
ALT: 62 IU/L — ABNORMAL HIGH (ref 0–32)
AST: 31 IU/L (ref 0–40)
Albumin/Globulin Ratio: 1.6 (ref 1.2–2.2)
BUN / CREAT RATIO: 21 (ref 9–23)
BUN: 10 mg/dL (ref 6–24)
Bilirubin Total: 0.4 mg/dL (ref 0.0–1.2)
CHLORIDE: 96 mmol/L (ref 96–106)
CO2: 20 mmol/L (ref 18–29)
CREATININE: 0.47 mg/dL — AB (ref 0.57–1.00)
Calcium: 9.8 mg/dL (ref 8.7–10.2)
GFR calc Af Amer: 134 mL/min/{1.73_m2} (ref >59)
GFR calc non Af Amer: 116 mL/min/{1.73_m2} (ref >59)
GLUCOSE: 211 mg/dL — AB (ref 65–99)
Globulin, Total: 2.6 g/dL (ref 1.5–4.5)
Potassium: 4.4 mmol/L (ref 3.5–5.2)
Sodium: 136 mmol/L (ref 134–144)
Total Protein: 6.8 g/dL (ref 6.0–8.5)

## 2016-12-28 ENCOUNTER — Ambulatory Visit: Payer: Managed Care, Other (non HMO) | Admitting: Family Medicine

## 2016-12-28 ENCOUNTER — Telehealth: Payer: Self-pay

## 2016-12-28 ENCOUNTER — Other Ambulatory Visit: Payer: Self-pay | Admitting: Family Medicine

## 2016-12-28 NOTE — Telephone Encounter (Signed)
Phone in order of Novolog pen for cartridges.

## 2016-12-28 NOTE — Telephone Encounter (Signed)
-----   Message from [place], [place] sent at 12/27/2016 11:22 PM EST ----- Blood sugar improved and potassium back to normal. Don't need any more potassium tablets. Proceed with insulin as advised at the last office visit and recheck in 2 weeks as planned.

## 2016-12-28 NOTE — Telephone Encounter (Signed)
Use with Novolog cartridges as directed on cartridge prescription for diabetes according to sliding scale. Only need one pen.

## 2016-12-28 NOTE — Telephone Encounter (Signed)
[facility] Patient advised.

## 2016-12-28 NOTE — Telephone Encounter (Signed)
Patient has been advised of lab report, she states that [facility] sent in cartridges for Novolog pen but did not send in prescription for patient to get pen. Patient request that Simona Huh send in a prescription for her to get pen. Patient wanted Simona Huh to know that she found a discount card online for [employer]. KW

## 2016-12-28 NOTE — Telephone Encounter (Signed)
What strength would you like and instructions? KW

## 2016-12-28 NOTE — Telephone Encounter (Signed)
Already has prescription for instructions and insulin cartridges. Only needs the Novolog Pen for the cartridges. If problems getting the pen, will change cartridge prescription to the [place].

## 2017-01-08 ENCOUNTER — Encounter: Payer: Self-pay | Admitting: Family Medicine

## 2017-01-08 ENCOUNTER — Ambulatory Visit (INDEPENDENT_AMBULATORY_CARE_PROVIDER_SITE_OTHER): Payer: Managed Care, Other (non HMO) | Admitting: Family Medicine

## 2017-01-08 VITALS — BP 138/84 | HR 99 | Temp 98.3°F | Resp 14 | Wt 219.6 lb

## 2017-01-08 DIAGNOSIS — E118 Type 2 diabetes mellitus with unspecified complications: Secondary | ICD-10-CM

## 2017-01-08 DIAGNOSIS — Z794 Long term (current) use of insulin: Secondary | ICD-10-CM

## 2017-01-08 DIAGNOSIS — I471 Supraventricular tachycardia: Secondary | ICD-10-CM | POA: Diagnosis not present

## 2017-01-08 NOTE — Progress Notes (Signed)
Patient: Kelly Matthews Female    DOB: 11-26-1965   51 y.o.   MRN: [id number] Visit Date: 01/08/2017  Today's Provider: [name], PA   Chief Complaint  Patient presents with  . Diabetes  . Follow-up   Subjective:    HPI  Diabetes Mellitus Type II, Follow-up:   Lab Results  Component Value Date   HGBA1C 11.8 (H) 12/06/2016   HGBA1C 6.7 (H) 07/17/2016   HGBA1C 6.9 (H) 11/22/2015    Last seen for diabetes 2 weeks ago.  Management since then includes changed Lantus to Toujeo 60 units daily, and continue Novolog- sliding scale. Patient is scheduled to see Endocrinologist on 01/18/2017. She reports good compliance with treatment. She states not having to take Novolog that often due to FBS being under 150.  She is not having side effects.  Current symptoms include fatigue Home blood sugar records: fasting range: 150 this morning, average under 150  Episodes of hypoglycemia? no   Current Insulin Regimen: Toujeo and Novolog Weight trend: stable Current diet: low carb, low sugar and more vegetables  Current exercise: walking  Pertinent Labs:    Component Value Date/Time   CHOL 194 07/17/2016 0827   TRIG 75 07/17/2016 0827   HDL 44 07/17/2016 0827   LDLCALC 135 (H) 07/17/2016 0827   CREATININE 0.47 (L) 12/25/2016 1102    Wt Readings from Last 3 Encounters:  01/08/17 219 lb 9.6 oz (99.6 kg)  12/25/16 213 lb 6.4 oz (96.8 kg)  12/17/16 207 lb 9.6 oz (94.2 kg)    ------------------------------------------------------------------------ Past Medical History:  Diagnosis Date  . Diabetes mellitus without complication ([place])   . Hypertension    Patient Active Problem List   Diagnosis Date Noted  . Malnutrition of moderate degree 12/07/2016  . DKA (diabetic ketoacidoses) ([place]) 12/05/2016  . Migraine headache 05/05/2016  . Allergic rhinitis 07/23/2015  . Excess, menstruation 07/19/2015  . 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 Blood glucose elevated  07/19/2015  . Airway hyperreactivity 07/19/2015  . Disorder of esophagus 07/19/2015  . Hypercholesterolemia without hypertriglyceridemia 07/19/2015  . Borderline diabetes 07/19/2015  . Diabetes ([place]) 11/28/2014  . Essential (primary) hypertension 11/28/2014  . Aortic heart valve narrowing 11/28/2014  . 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 Supraventricular tachycardia ([place]) 11/28/2014  . 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 BREAST BIOPSY Left 04/18/2015   Benign calcification  . CESAREAN SECTION  1990,2001  . INTRAUTERINE DEVICE INSERTION  October 2015   [facility]   Family History  Problem Relation Age of Onset  . Coronary artery disease Mother   . Coronary artery disease Father   . Diabetes Father   . Emphysema Father   . Seizures Daughter   . Cancer Maternal Grandmother   . Breast cancer Neg Hx    No Known Allergies   Previous Medications   AMITRIPTYLINE (ELAVIL) 10 MG TABLET    TAKE 1 TABLET (10 MG TOTAL) BY MOUTH 3 (THREE) TIMES DAILY.   B-D INS SYR ULTRAFINE 1CC/31G 31G X 5/16" 1 ML MISC    USE ONE SYRINGE FOR EACH DOSAGE (NOVOLOG THREE TIMES A DAY AND LANTUS ONCE A DAY)   BUDESONIDE (PULMICORT FLEXHALER) 180 MCG/ACT INHALER       DILTIAZEM (DILACOR XR) 120 MG 24 HR CAPSULE    1 capsule daily.   FLUTICASONE (FLONASE) 50 MCG/ACT NASAL SPRAY    SPRAY 2 SPRAYS IN EACH NOSTRIL  DAILY   INSULIN ASPART (NOVOLOG PENFILL) CARTRIDGE    Check blood sugar before meals and take 6 units if glucose 151-200, 10 units if 201-250, 12 units if 251-300 and 14 units if >300.   INSULIN GLARGINE (TOUJEO SOLOSTAR) 300 UNIT/ML SOPN    Inject 60 Units into the skin daily.   INSULIN PEN NEEDLE 31G X 5 MM MISC    Use pen needles for Toujeo and Novolog Pen as directed on those prescriptions.   INSULIN SYRINGES, DISPOSABLE, U-100 1 ML MISC    USE ONE SYRINGE FOR EACH DOSAGE (NOVOLOG THREE TIMES A DAY AND  LANTUS ONCE A DAY)   METFORMIN (GLUCOPHAGE) 500 MG TABLET    TAKE 1 TABLET (500 MG TOTAL) BY MOUTH 2 (TWO) TIMES DAILY.   MONTELUKAST (SINGULAIR) 10 MG TABLET    TAKE 1 TABLET BY MOUTH EVERY DAY   ONDANSETRON (ZOFRAN) 4 MG TABLET    Take 1 tablet (4 mg total) by mouth every 6 (six) hours as needed for nausea or vomiting.   ONE TOUCH ULTRA TEST TEST STRIP    USE 1 STRIP THREE TIMES DAILY AS NEEDED TO ADJUST INSULIN DOSAGE   ONETOUCH DELICA LANCETS [id number] MISC    TEST BLOOD SUGAR THREE TIMES A DAY TO ADJUST INSULIN DOSAGE   POTASSIUM CHLORIDE (KLOR-CON) 20 MEQ PACKET    Take 40 mEq by mouth daily.   POTASSIUM CHLORIDE SA (K-DUR,KLOR-CON) 20 MEQ TABLET    Take 1 tablet (20 mEq total) by mouth 2 (two) times daily.   SUMATRIPTAN (IMITREX) 50 MG TABLET    Take one tablet by mouth at onset of migraine. May repeat in 2 hours if headache persists or recurs. Limit 2 doses in 24 hours.    Review of Systems  Constitutional: Positive for fatigue.  Respiratory: Negative.   Gastrointestinal: Negative.   Endocrine: Negative.     Social History  Substance Use Topics  . Smoking status: Former Smoker    Years: 1.00  . Smokeless tobacco: Never Used  . Alcohol use No     Comment: occasionally   Objective:   BP 138/84 (BP Location: Right Arm, Patient Position: Sitting, Cuff Size: Normal)   Pulse 99   Temp 98.3 F (36.8 C) (Oral)   Resp 14   Wt 219 lb 9.6 oz (99.6 kg)   SpO2 98%   BMI 44.35 kg/m   Physical Exam  Constitutional: She is oriented to person, place, and time. She appears well-developed and well-nourished. No distress.  HENT:  Head: Normocephalic and atraumatic.  Right Ear: Hearing normal.  Left Ear: Hearing normal.  Nose: Nose normal.  Eyes: Conjunctivae and lids are normal. Right eye exhibits no discharge. Left eye exhibits no discharge. No scleral icterus.  Cardiovascular: Normal rate and regular rhythm.   Pulmonary/Chest: Effort normal and breath sounds normal. No respiratory  distress.  Abdominal: Bowel sounds are normal.  Neurological: She is alert and oriented to person, place, and time.  Skin: Skin is intact. No lesion and no rash noted.  Psychiatric: She has a normal mood and affect. Her speech is normal and behavior is normal. Thought content normal.      Assessment & Plan:     1. Type 2 diabetes mellitus with complication, with long-term current use of insulin (HCC) Feeling better and FBS 150 this morning. Has been on a better diet with "more vegetables, low carbs and low sugar". Presently on 60 units of Toujeo and not having to use the Novolog sliding  scale much because BS under 150 sometimes. Denies hypoglycemic episodes, polyuria, polydipsia or polyphagia. Vision is blurring again since she changed her glasses the first of the year when blood sugar was very high. Time to reschedule eye exam to get the prescription correct now that diabetes is better controlled. Has an appointment with Dr. Gabriel Carina (endocrinologist) on 01-18-17. Will be able to return to work when endocrinologist and neurosurgeon releases her.  2. Supraventricular tachycardia ([place]) Stable and cardiology evaluation on 01-05-17 by Dr. Nehemiah Massed. He gave her clearance to proceed with cervical surgery without restriction to pre/post operative and/or procedural care - lowest risk possible for cardiovascular complications. Given medical clearance.

## 2017-01-11 ENCOUNTER — Telehealth: Payer: Self-pay | Admitting: Emergency Medicine

## 2017-01-11 NOTE — Telephone Encounter (Signed)
Please review. Emily Drozdowski, CMA  

## 2017-01-11 NOTE — Telephone Encounter (Signed)
See copies in media file of chart for forms sent to [place] (one faxed on 12-16-16 with copy of OV of 12-10-16 then the other faxed on 12-22-16 with copy of OV of 12-17-16). Be sure the received them.

## 2017-01-11 NOTE — Telephone Encounter (Signed)
Pt calling about her paper work that was dropped off 2-3 weeks ago. She reports that she was told it was sent but she called her employer at [employer] and they did not receive anything. She has 2 sets of paper work and wasn't sure if they both got filled out and faxed to the same place. Please advise.

## 2017-01-12 NOTE — Telephone Encounter (Signed)
FMLA re faxed to [employer]. Patient advised.

## 2017-01-14 ENCOUNTER — Telehealth: Payer: Self-pay

## 2017-01-14 NOTE — Telephone Encounter (Signed)
Spoke with pharmacy and she was trying to run this insulin through for coverage but rejection messages comes up saying that Toujeo not covered and that preferred alternative is Levemir. Please review.-aa

## 2017-01-14 NOTE — Telephone Encounter (Signed)
Patient called and states that Toujeo insulin per pharmacy is not going to be filled till 01/22/17 but she has enough of dose to last 5 days and will run out. RX for Toujeo was sent in on 12/25/16 to dispense 3 pens-uses 60 units daily. Please let patient know what to do? -aa

## 2017-01-14 NOTE — Telephone Encounter (Signed)
Change prescriptions to 5-6 pens.

## 2017-01-15 NOTE — Telephone Encounter (Signed)
Patient was using a discount card that was cheaper than insurance.

## 2017-01-18 NOTE — Telephone Encounter (Signed)
Pt advised. Is continuing to use discount card without problem. Renaldo Fiddler, CMA

## 2017-01-18 NOTE — Telephone Encounter (Signed)
Please discuss with patient, I was on the phone last week and working with dr Rosanna Randy now-aa

## 2017-01-19 ENCOUNTER — Encounter: Payer: Self-pay | Admitting: Family Medicine

## 2017-01-19 ENCOUNTER — Ambulatory Visit
Admission: RE | Admit: 2017-01-19 | Discharge: 2017-01-19 | Disposition: A | Discharge status: Home / Self Care | Payer: Managed Care, Other (non HMO) | Source: Ambulatory Visit | Attending: Neurological Surgery | Admitting: Neurological Surgery

## 2017-01-19 ENCOUNTER — Encounter
Admission: RE | Admit: 2017-01-19 | Discharge: 2017-01-19 | Disposition: A | Discharge status: Home / Self Care | Payer: Managed Care, Other (non HMO) | Source: Ambulatory Visit | Attending: Neurological Surgery | Admitting: Neurological Surgery

## 2017-01-19 DIAGNOSIS — Z01810 Encounter for preprocedural cardiovascular examination: Secondary | ICD-10-CM | POA: Insufficient documentation

## 2017-01-19 DIAGNOSIS — Z01818 Encounter for other preprocedural examination: Secondary | ICD-10-CM

## 2017-01-19 DIAGNOSIS — Z01812 Encounter for preprocedural laboratory examination: Secondary | ICD-10-CM | POA: Insufficient documentation

## 2017-01-19 DIAGNOSIS — I10 Essential (primary) hypertension: Secondary | ICD-10-CM | POA: Diagnosis not present

## 2017-01-19 DIAGNOSIS — E119 Type 2 diabetes mellitus without complications: Secondary | ICD-10-CM | POA: Diagnosis not present

## 2017-01-19 HISTORY — DX: Tachycardia, unspecified: R00.0

## 2017-01-19 HISTORY — DX: Other cervical disc displacement, unspecified cervical region: M50.20

## 2017-01-19 HISTORY — DX: Psoriasis, unspecified: L40.9

## 2017-01-19 LAB — URINALYSIS, ROUTINE W REFLEX MICROSCOPIC
BACTERIA UA: NONE SEEN
BILIRUBIN URINE: NEGATIVE
GLUCOSE, UA: NEGATIVE mg/dL
HGB URINE DIPSTICK: NEGATIVE
Ketones, ur: NEGATIVE mg/dL
LEUKOCYTES UA: NEGATIVE
NITRITE: NEGATIVE
Protein, ur: 100 mg/dL — AB
Specific Gravity, Urine: 1.025 (ref 1.005–1.030)
pH: 5 (ref 5.0–8.0)

## 2017-01-19 LAB — CBC
HEMATOCRIT: 40.8 % (ref 35.0–47.0)
Hemoglobin: 13.7 g/dL (ref 12.0–16.0)
MCH: 28 pg (ref 26.0–34.0)
MCHC: 33.5 g/dL (ref 32.0–36.0)
MCV: 83.6 fL (ref 80.0–100.0)
PLATELETS: 179 10*3/uL (ref 150–440)
RBC: 4.88 MIL/uL (ref 3.80–5.20)
RDW: 14.3 % (ref 11.5–14.5)
WBC: 12.5 10*3/uL — AB (ref 3.6–11.0)

## 2017-01-19 LAB — APTT: aPTT: 30 seconds (ref 24–36)

## 2017-01-19 LAB — PROTIME-INR
INR: 1.07
PROTHROMBIN TIME: 13.9 s (ref 11.4–15.2)

## 2017-01-19 LAB — TYPE AND SCREEN
ABO/RH(D): O POS
Antibody Screen: NEGATIVE

## 2017-01-19 LAB — BASIC METABOLIC PANEL
Anion gap: 8 (ref 5–15)
BUN: 9 mg/dL (ref 6–20)
CHLORIDE: 104 mmol/L (ref 101–111)
CO2: 24 mmol/L (ref 22–32)
CREATININE: 0.32 mg/dL — AB (ref 0.44–1.00)
Calcium: 9.6 mg/dL (ref 8.9–10.3)
GFR calc Af Amer: >60 mL/min (ref >60)
GFR calc non Af Amer: >60 mL/min (ref >60)
GLUCOSE: 122 mg/dL — AB (ref 65–99)
POTASSIUM: 3.9 mmol/L (ref 3.5–5.1)
Sodium: 136 mmol/L (ref 135–145)

## 2017-01-19 LAB — DIFFERENTIAL
Basophils Absolute: 0 10*3/uL (ref 0–0.1)
Basophils Relative: 0 %
EOS PCT: 0 %
Eosinophils Absolute: 0 10*3/uL (ref 0–0.7)
LYMPHS ABS: 1.7 10*3/uL (ref 1.0–3.6)
LYMPHS PCT: 13 %
MONO ABS: 0.5 10*3/uL (ref 0.2–0.9)
MONOS PCT: 4 %
NEUTROS ABS: 10.3 10*3/uL — AB (ref 1.4–6.5)
Neutrophils Relative %: 83 %

## 2017-01-19 LAB — SURGICAL PCR SCREEN
MRSA, PCR: NEGATIVE
Staphylococcus aureus: NEGATIVE

## 2017-01-19 NOTE — Pre-Procedure Instructions (Signed)
ECG 12-lead8/02/2016 [place] Component Name Value Ref Range  Vent Rate (bpm) 100   PR Interval (msec) 134   QRS Interval (msec) 86   QT Interval (msec) 366   QTc (msec) 472   Result Narrative  Normal sinus rhythm Possible Left atrial enlargement Possible Anterior infarct , age undetermined Abnormal ECG No previous ECGs available I reviewed and concur with this report. Electronically signed [id number] MD, Darnell Level [phone]) on 06/23/2016 1:18:10 PM

## 2017-01-19 NOTE — Patient Instructions (Signed)
  Your procedure is scheduled [id number] March 19 , 2018. Report to Same Day Surgery. To find out your arrival time please call [phone] between 1PM - 3PM on Friday January 29, 2017.  Remember: Instructions that are not followed completely may result in serious medical risk, up to and including death, or upon the discretion of your surgeon and anesthesiologist your surgery may need to be rescheduled.    _x___ 1. Do not eat food or drink liquids after midnight. No gum chewing or hard candies.     _x___ 2. No Alcohol for 24 hours before or after surgery.   ____ 3. Bring all medications with you on the day of surgery if instructed.    __x__ 4. Notify your doctor if there is any change in your medical condition     (cold, fever, infections).    _____ 5. No smoking 24 hours prior to surgery.     Do not wear jewelry, make-up, hairpins, clips or nail polish.  Do not wear lotions, powders, or perfumes.   Do not shave 48 hours prior to surgery. Men may shave face and neck.  Do not bring valuables to the hospital.    [facility] is not responsible for any belongings or valuables.               Contacts, dentures or bridgework may not be worn into surgery.  Leave your suitcase in the car. After surgery it may be brought to your room.  For patients admitted to the hospital, discharge time is determined by your treatment team.   Patients discharged the day of surgery will not be allowed to drive home.    Please read over the following fact sheets that you were given:   [facility] Preparing for Surgery  _x___ Take these medicines the morning of surgery with A SIP OF WATER:    1. diltiazem (DILACOR XR)  2. gabapentin (NEURONTIN)  3. montelukast (SINGULAIR)   4. HYDROcodone-acetaminophen (NORCO/VICODIN) OPTIONAL  ____ Fleet Enema (as directed)   _x___ Use CHG Soap as directed on instruction sheet  ____ Use inhalers on the day of surgery and bring to hospital day of surgery  __x__  Stop metformin 2 days prior to surgery    _x___ Take 1/2 of usual insulin dose the night before surgery and none on the morning of surgery.   ____ Stop Coumadin/Plavix/aspirin on does not apply.  _x___ Stop Anti-inflammatories such as Advil, Aleve, Ibuprofen, Motrin, Naproxen,  Naprosyn, Goodies powders or aspirin products. OK to take Tylenol.   ____ Stop supplements until after surgery.    ____ Bring C-Pap to the hospital.

## 2017-01-19 NOTE — Pre-Procedure Instructions (Signed)
51 y.o. female with  Encounter Diagnoses  Name Primary?  . Supraventricular tachycardia (CMS-HCC) Yes  . Mild aortic valve stenosis  . Benign essential hypertension   Plan   -Proceed to surgery and/or invasive procedure without restriction to pre or post operative and/or procedural care. The patient is at lowest risk possible for cardiovascular complications with surgical intervention and/or invasive procedure. Currently has no evidence active and/or significant angina and/or congestive heart failure.  -Treatment of tachycardia will include diltiazem  -There has been a review of medication management for hypertension control and of the current medical regimen used. The patient understands all the risks and benefits of hypertension control to reduce possible future cardiovascular complications and risk of disease. There will be no changes in medication regimen necessary today -We have discussed the signs and symptoms of progression of aortic valve stenosis severity including shortness of breath, chest pain, fatigue, dizziness, syncope, and lower extremity swelling. The patient understands and will watch for these issues between each follow up appointment.  No orders of the defined types were placed in this encounter.  Return in about 6 months (around 07/05/2017).  Flossie Dibble, MD

## 2017-01-20 LAB — HEMOGLOBIN A1C
Hgb A1c MFr Bld: 9.6 % — ABNORMAL HIGH (ref 4.8–5.6)
MEAN PLASMA GLUCOSE: 229 mg/dL

## 2017-01-20 NOTE — Pre-Procedure Instructions (Signed)
LABS FAXED TO DR Aris Lot. LM FOR [facility]

## 2017-01-21 ENCOUNTER — Other Ambulatory Visit: Payer: Self-pay | Admitting: Family Medicine

## 2017-01-25 ENCOUNTER — Other Ambulatory Visit: Payer: Self-pay | Admitting: Family Medicine

## 2017-01-31 MED ORDER — FAMOTIDINE 20 MG PO TABS
20.0000 mg | ORAL_TABLET | Freq: Once | ORAL | Status: AC
  Administered 2017-02-01: 20 mg via ORAL

## 2017-02-01 ENCOUNTER — Ambulatory Visit: Payer: Managed Care, Other (non HMO) | Admitting: Anesthesiology

## 2017-02-01 ENCOUNTER — Encounter: Payer: Self-pay | Admitting: *Deleted

## 2017-02-01 ENCOUNTER — Encounter
Admission: RE | Disposition: A | Discharge status: Home / Self Care | Payer: Self-pay | Source: Ambulatory Visit | Attending: Neurological Surgery

## 2017-02-01 ENCOUNTER — Ambulatory Visit
Admission: RE | Admit: 2017-02-01 | Discharge: 2017-02-01 | Disposition: A | Discharge status: Home / Self Care | Payer: Managed Care, Other (non HMO) | Source: Ambulatory Visit | Attending: Neurological Surgery | Admitting: Neurological Surgery

## 2017-02-01 ENCOUNTER — Ambulatory Visit: Payer: Managed Care, Other (non HMO)

## 2017-02-01 DIAGNOSIS — K760 Fatty (change of) liver, not elsewhere classified: Secondary | ICD-10-CM | POA: Insufficient documentation

## 2017-02-01 DIAGNOSIS — J45909 Unspecified asthma, uncomplicated: Secondary | ICD-10-CM | POA: Diagnosis not present

## 2017-02-01 DIAGNOSIS — M50123 Cervical disc disorder at C6-C7 level with radiculopathy: Secondary | ICD-10-CM | POA: Diagnosis present

## 2017-02-01 DIAGNOSIS — Z6841 Body Mass Index (BMI) 40.0 and over, adult: Secondary | ICD-10-CM | POA: Diagnosis not present

## 2017-02-01 DIAGNOSIS — I471 Supraventricular tachycardia: Secondary | ICD-10-CM | POA: Diagnosis not present

## 2017-02-01 DIAGNOSIS — E119 Type 2 diabetes mellitus without complications: Secondary | ICD-10-CM | POA: Diagnosis not present

## 2017-02-01 DIAGNOSIS — Z419 Encounter for procedure for purposes other than remedying health state, unspecified: Secondary | ICD-10-CM

## 2017-02-01 DIAGNOSIS — Z79899 Other long term (current) drug therapy: Secondary | ICD-10-CM | POA: Diagnosis not present

## 2017-02-01 DIAGNOSIS — Z87891 Personal history of nicotine dependence: Secondary | ICD-10-CM | POA: Diagnosis not present

## 2017-02-01 DIAGNOSIS — Z794 Long term (current) use of insulin: Secondary | ICD-10-CM | POA: Diagnosis not present

## 2017-02-01 DIAGNOSIS — G43909 Migraine, unspecified, not intractable, without status migrainosus: Secondary | ICD-10-CM | POA: Diagnosis not present

## 2017-02-01 DIAGNOSIS — Z87898 Personal history of other specified conditions: Secondary | ICD-10-CM

## 2017-02-01 DIAGNOSIS — I10 Essential (primary) hypertension: Secondary | ICD-10-CM | POA: Insufficient documentation

## 2017-02-01 HISTORY — PX: ANTERIOR CERVICAL DECOMP/DISCECTOMY FUSION: SHX1161

## 2017-02-01 LAB — GLUCOSE, CAPILLARY
GLUCOSE-CAPILLARY: 143 mg/dL — AB (ref 65–99)
GLUCOSE-CAPILLARY: 118 mg/dL — AB (ref 65–99)

## 2017-02-01 LAB — ABO/RH: ABO/RH(D): O POS

## 2017-02-01 LAB — POCT PREGNANCY, URINE: PREG TEST UR: NEGATIVE

## 2017-02-01 SURGERY — ANTERIOR CERVICAL DECOMPRESSION/DISCECTOMY FUSION 1 LEVEL
Anesthesia: General | Wound class: Clean

## 2017-02-01 MED ORDER — FENTANYL CITRATE (PF) 100 MCG/2ML IJ SOLN
INTRAMUSCULAR | Status: AC
  Administered 2017-02-01: 25 ug via INTRAVENOUS
  Filled 2017-02-01: qty 2

## 2017-02-01 MED ORDER — GELATIN ABSORBABLE 12-7 MM EX MISC
CUTANEOUS | Status: DC | PRN
  Administered 2017-02-01: 1

## 2017-02-01 MED ORDER — PROPOFOL 500 MG/50ML IV EMUL
INTRAVENOUS | Status: AC
  Filled 2017-02-01: qty 50

## 2017-02-01 MED ORDER — CEFAZOLIN IN D5W 1 GM/50ML IV SOLN
1.0000 g | INTRAVENOUS | Status: AC
  Administered 2017-02-01: 2 g via INTRAVENOUS

## 2017-02-01 MED ORDER — PROPOFOL 500 MG/50ML IV EMUL
INTRAVENOUS | Status: DC | PRN
  Administered 2017-02-01: 80 ug/kg/min via INTRAVENOUS

## 2017-02-01 MED ORDER — ROCURONIUM BROMIDE 100 MG/10ML IV SOLN
INTRAVENOUS | Status: DC | PRN
  Administered 2017-02-01: 40 mg via INTRAVENOUS
  Administered 2017-02-01: 10 mg via INTRAVENOUS

## 2017-02-01 MED ORDER — THROMBIN 5000 UNITS EX SOLR
CUTANEOUS | Status: AC
  Filled 2017-02-01: qty 5000

## 2017-02-01 MED ORDER — FAMOTIDINE 20 MG PO TABS
ORAL_TABLET | ORAL | Status: AC
  Filled 2017-02-01: qty 1

## 2017-02-01 MED ORDER — ACETAMINOPHEN 10 MG/ML IV SOLN
INTRAVENOUS | Status: AC
  Filled 2017-02-01: qty 100

## 2017-02-01 MED ORDER — HYDROMORPHONE HCL 1 MG/ML IJ SOLN
INTRAMUSCULAR | Status: AC
  Administered 2017-02-01: 0.25 mg via INTRAVENOUS
  Filled 2017-02-01: qty 1

## 2017-02-01 MED ORDER — HYDROCODONE-ACETAMINOPHEN 5-325 MG PO TABS
1.0000 | ORAL_TABLET | Freq: Four times a day (QID) | ORAL | Status: DC | PRN
  Administered 2017-02-01: 1 via ORAL

## 2017-02-01 MED ORDER — SODIUM CHLORIDE 0.9 % IR SOLN
Status: DC | PRN
  Administered 2017-02-01: 250 mL

## 2017-02-01 MED ORDER — CEFAZOLIN IN D5W 1 GM/50ML IV SOLN
INTRAVENOUS | Status: AC
  Filled 2017-02-01: qty 50

## 2017-02-01 MED ORDER — GELATIN ABSORBABLE 12-7 MM EX MISC
CUTANEOUS | Status: AC
  Filled 2017-02-01: qty 1

## 2017-02-01 MED ORDER — SUCCINYLCHOLINE CHLORIDE 20 MG/ML IJ SOLN
INTRAMUSCULAR | Status: AC
  Filled 2017-02-01: qty 1

## 2017-02-01 MED ORDER — CEFAZOLIN (ANCEF) 1 G IV SOLR
1.0000 g | INTRAVENOUS | Status: DC
  Filled 2017-02-01: qty 1

## 2017-02-01 MED ORDER — MIDAZOLAM HCL 2 MG/2ML IJ SOLN
INTRAMUSCULAR | Status: AC
  Filled 2017-02-01: qty 2

## 2017-02-01 MED ORDER — FENTANYL CITRATE (PF) 250 MCG/5ML IJ SOLN
INTRAMUSCULAR | Status: AC
  Filled 2017-02-01: qty 5

## 2017-02-01 MED ORDER — METHOCARBAMOL 500 MG PO TABS: 500.0000 mg | ORAL_TABLET | Freq: Three times a day (TID) | ORAL | 0 refills | Status: DC

## 2017-02-01 MED ORDER — REMIFENTANIL HCL 1 MG IV SOLR
INTRAVENOUS | Status: DC | PRN
  Administered 2017-02-01: .1 ug/kg/min via INTRAVENOUS

## 2017-02-01 MED ORDER — BUPIVACAINE-EPINEPHRINE (PF) 0.5% -1:200000 IJ SOLN
INTRAMUSCULAR | Status: AC
  Filled 2017-02-01: qty 30

## 2017-02-01 MED ORDER — ROCURONIUM BROMIDE 50 MG/5ML IV SOLN
INTRAVENOUS | Status: AC
  Filled 2017-02-01: qty 1

## 2017-02-01 MED ORDER — SODIUM CHLORIDE 0.9 % IJ SOLN
INTRAMUSCULAR | Status: AC
  Filled 2017-02-01: qty 10

## 2017-02-01 MED ORDER — FENTANYL CITRATE (PF) 100 MCG/2ML IJ SOLN
INTRAMUSCULAR | Status: DC | PRN
  Administered 2017-02-01: 150 ug via INTRAVENOUS
  Administered 2017-02-01: 100 ug via INTRAVENOUS

## 2017-02-01 MED ORDER — PROPOFOL 10 MG/ML IV BOLUS
INTRAVENOUS | Status: DC | PRN
  Administered 2017-02-01: 150 mg via INTRAVENOUS
  Administered 2017-02-01: 50 mg via INTRAVENOUS

## 2017-02-01 MED ORDER — ONDANSETRON HCL 4 MG/2ML IJ SOLN: 4.0000 mg | Freq: Once | INTRAMUSCULAR | Status: DC | PRN

## 2017-02-01 MED ORDER — SODIUM CHLORIDE 0.9 % IV SOLN
INTRAVENOUS | Status: DC
  Administered 2017-02-01: 07:00:00 via INTRAVENOUS

## 2017-02-01 MED ORDER — DEXAMETHASONE SODIUM PHOSPHATE 10 MG/ML IJ SOLN
INTRAMUSCULAR | Status: AC
  Filled 2017-02-01: qty 1

## 2017-02-01 MED ORDER — MIDAZOLAM HCL 2 MG/2ML IJ SOLN
INTRAMUSCULAR | Status: DC | PRN
  Administered 2017-02-01: 2 mg via INTRAVENOUS

## 2017-02-01 MED ORDER — FENTANYL CITRATE (PF) 100 MCG/2ML IJ SOLN
INTRAMUSCULAR | Status: AC
Start: 2017-02-01 — End: 2017-02-01
  Administered 2017-02-01: 25 ug via INTRAVENOUS
  Filled 2017-02-01: qty 2

## 2017-02-01 MED ORDER — CEFEPIME-DEXTROSE 1 GM/50ML IV SOLR: 1.0000 g | INTRAVENOUS | Status: DC

## 2017-02-01 MED ORDER — HYDROMORPHONE HCL 1 MG/ML IJ SOLN
0.2500 mg | INTRAMUSCULAR | Status: DC | PRN
  Administered 2017-02-01: 0.25 mg via INTRAVENOUS
  Administered 2017-02-01: 0.5 mg via INTRAVENOUS
  Administered 2017-02-01: 0.25 mg via INTRAVENOUS

## 2017-02-01 MED ORDER — ONDANSETRON HCL 4 MG/2ML IJ SOLN
INTRAMUSCULAR | Status: DC | PRN
Start: 2017-02-01 — End: 2017-02-01
  Administered 2017-02-01: 4 mg via INTRAVENOUS

## 2017-02-01 MED ORDER — SUCCINYLCHOLINE CHLORIDE 20 MG/ML IJ SOLN
INTRAMUSCULAR | Status: DC | PRN
  Administered 2017-02-01: 100 mg via INTRAVENOUS

## 2017-02-01 MED ORDER — HYDROCODONE-ACETAMINOPHEN 5-325 MG PO TABS: 1.0000 | ORAL_TABLET | Freq: Four times a day (QID) | ORAL | 0 refills | Status: DC | PRN

## 2017-02-01 MED ORDER — THROMBIN 5000 UNITS EX KIT
PACK | CUTANEOUS | Status: DC | PRN
  Administered 2017-02-01: 5000 [IU] via TOPICAL

## 2017-02-01 MED ORDER — BACITRACIN 50000 UNITS IM SOLR
INTRAMUSCULAR | Status: AC
  Filled 2017-02-01: qty 1

## 2017-02-01 MED ORDER — LIDOCAINE HCL (CARDIAC) 20 MG/ML IV SOLN
INTRAVENOUS | Status: DC | PRN
  Administered 2017-02-01: 100 mg via INTRAVENOUS

## 2017-02-01 MED ORDER — ACETAMINOPHEN 10 MG/ML IV SOLN
INTRAVENOUS | Status: DC | PRN
  Administered 2017-02-01: 1000 mg via INTRAVENOUS

## 2017-02-01 MED ORDER — SUGAMMADEX SODIUM 200 MG/2ML IV SOLN
INTRAVENOUS | Status: AC
  Filled 2017-02-01: qty 2

## 2017-02-01 MED ORDER — THROMBIN 5000 UNITS EX SOLR
OROMUCOSAL | Status: DC | PRN
  Administered 2017-02-01: 10 mL via TOPICAL

## 2017-02-01 MED ORDER — OMEPRAZOLE 40 MG PO CPDR: 40.0000 mg | DELAYED_RELEASE_CAPSULE | Freq: Every day | ORAL | 0 refills | Status: DC

## 2017-02-01 MED ORDER — FENTANYL CITRATE (PF) 100 MCG/2ML IJ SOLN
25.0000 ug | INTRAMUSCULAR | Status: AC | PRN
  Administered 2017-02-01 (×6): 25 ug via INTRAVENOUS

## 2017-02-01 MED ORDER — REMIFENTANIL HCL 1 MG IV SOLR
INTRAVENOUS | Status: AC
  Filled 2017-02-01: qty 1000

## 2017-02-01 MED ORDER — ONDANSETRON HCL 4 MG/2ML IJ SOLN
INTRAMUSCULAR | Status: AC
  Filled 2017-02-01: qty 2

## 2017-02-01 MED ORDER — SUGAMMADEX SODIUM 200 MG/2ML IV SOLN
INTRAVENOUS | Status: DC | PRN
  Administered 2017-02-01: 200 mg via INTRAVENOUS

## 2017-02-01 MED ORDER — GLYCOPYRROLATE 0.2 MG/ML IJ SOLN
INTRAMUSCULAR | Status: AC
  Filled 2017-02-01: qty 1

## 2017-02-01 MED ORDER — HYDROCODONE-ACETAMINOPHEN 5-325 MG PO TABS
ORAL_TABLET | ORAL | Status: AC
  Filled 2017-02-01: qty 1

## 2017-02-01 MED ORDER — LIDOCAINE HCL (PF) 2 % IJ SOLN
INTRAMUSCULAR | Status: AC
  Filled 2017-02-01: qty 2

## 2017-02-01 SURGICAL SUPPLY — 55 items
BAND RUBBER 3X1/6 TAN STRL (MISCELLANEOUS) ×2 IMPLANT
BIT DRILL 16X2.5XSTP NS (BIT) ×1 IMPLANT
BIT DRL 16X2.5XSTP NS (BIT) ×1
BLADE BOVIE TIP EXT 4 (BLADE) ×2 IMPLANT
BLADE SURG 15 STRL LF DISP TIS (BLADE) ×1 IMPLANT
BLADE SURG 15 STRL SS (BLADE) ×1
BONE CERV LORDOTIC 14.5X12X8 (Bone Implant) ×2 IMPLANT
BUR NEURO DRILL SOFT 3.0X3.8M (BURR) ×2 IMPLANT
CANISTER SUCT 1200ML W/VALVE (MISCELLANEOUS) ×4 IMPLANT
CHLORAPREP W/TINT 26ML (MISCELLANEOUS) ×4 IMPLANT
COUNTER NEEDLE 20/40 LG (NEEDLE) ×2 IMPLANT
COVER LIGHT HANDLE STERIS (MISCELLANEOUS) ×4 IMPLANT
CUP MEDICINE 2OZ PLAST GRAD ST (MISCELLANEOUS) ×4 IMPLANT
DRAPE C-ARM 42X72 X-RAY (DRAPES) ×2 IMPLANT
DRAPE INCISE IOBAN 66X45 STRL (DRAPES) IMPLANT
DRAPE MICROSCOPE SPINE 48X150 (DRAPES) ×2 IMPLANT
DRAPE POUCH INSTRU U-SHP 10X18 (DRAPES) ×2 IMPLANT
DRAPE SHEET LG 3/4 BI-LAMINATE (DRAPES) ×2 IMPLANT
DRAPE SURG 17X11 SM STRL (DRAPES) ×8 IMPLANT
DRAPE TABLE BACK 80X90 (DRAPES) ×2 IMPLANT
DRAPE THYROID T SHEET (DRAPES) ×2 IMPLANT
DRILL BIT 16MM (BIT) ×1
DRILL, DISPOSABLE 16MM ×2 IMPLANT
ELECT CAUTERY BLADE TIP 2.5 (TIP) ×2
ELECTRODE CAUTERY BLDE TIP 2.5 (TIP) ×1 IMPLANT
FEE INTRAOP MONITOR IMPULS NCS (MISCELLANEOUS) ×1 IMPLANT
GAUZE SPONGE 4X4 12PLY STRL (GAUZE/BANDAGES/DRESSINGS) ×2 IMPLANT
GLOVE BIO SURGEON STRL SZ7.5 (GLOVE) ×4 IMPLANT
GLOVE BIOGEL PI IND STRL 8 (GLOVE) ×1 IMPLANT
GLOVE BIOGEL PI INDICATOR 8 (GLOVE) ×1
GOWN STRL REUS W/ TWL LRG LVL3 (GOWN DISPOSABLE) ×1 IMPLANT
GOWN STRL REUS W/ TWL XL LVL3 (GOWN DISPOSABLE) ×1 IMPLANT
GOWN STRL REUS W/TWL LRG LVL3 (GOWN DISPOSABLE) ×1
GOWN STRL REUS W/TWL XL LVL3 (GOWN DISPOSABLE) ×1
GRADUATE 1200CC STRL 31836 (MISCELLANEOUS) ×2 IMPLANT
INTRAOP MONITOR FEE IMPULS NCS (MISCELLANEOUS) ×1
INTRAOP MONITOR FEE IMPULSE (MISCELLANEOUS) ×1
KIT RM TURNOVER STRD PROC AR (KITS) ×2 IMPLANT
MARKER SKIN DUAL TIP RULER LAB (MISCELLANEOUS) ×2 IMPLANT
NEEDLE HYPO 22GX1.5 SAFETY (NEEDLE) ×2 IMPLANT
NEEDLE SPNL 18GX3.5 QUINCKE PK (NEEDLE) ×2 IMPLANT
NS IRRIG 1000ML POUR BTL (IV SOLUTION) ×2 IMPLANT
PACK LAMINECTOMY NEURO (CUSTOM PROCEDURE TRAY) ×2 IMPLANT
PIN CASPAR 14 (PIN) ×1 IMPLANT
PIN CASPAR 14MM (PIN) ×2
PLATE VECTRA 16MM (Plate) ×2 IMPLANT
SCREW SELF TAPPING 16MM (Screw) ×8 IMPLANT
SPOGE SURGIFLO 8M (HEMOSTASIS) ×2
SPONGE KITTNER 5P (MISCELLANEOUS) ×4 IMPLANT
SPONGE SURGIFLO 8M (HEMOSTASIS) ×2 IMPLANT
SUT VIC AB 3-0 SH 8-18 (SUTURE) ×4 IMPLANT
SYR 30ML LL (SYRINGE) ×2 IMPLANT
TOWEL OR 17X26 4PK STRL BLUE (TOWEL DISPOSABLE) ×4 IMPLANT
TRAY FOLEY W/METER SILVER 16FR (SET/KITS/TRAYS/PACK) IMPLANT
TUBING CONNECTING 10 (TUBING) ×2 IMPLANT

## 2017-02-01 NOTE — Anesthesia Post-op Follow-up Note (Cosign Needed)
Anesthesia QCDR form completed.        

## 2017-02-01 NOTE — Discharge Instructions (Signed)

## 2017-02-01 NOTE — Anesthesia Procedure Notes (Addendum)
Procedure Name: Intubation Date/Time: 02/01/2017 7:41 AM Performed by: Rosaria Ferries, Evalette Montrose Pre-anesthesia Checklist: Patient identified, Emergency Drugs available, Suction available and Patient being monitored Patient Re-evaluated:Patient Re-evaluated prior to inductionOxygen Delivery Method: Circle system utilized Preoxygenation: Pre-oxygenation with 100% oxygen Intubation Type: IV induction Laryngoscope Size: Mac and 3 Grade View: Grade I Tube size: 7.0 mm Number of attempts: 1 Placement Confirmation: ETT inserted through vocal cords under direct vision,  positive ETCO2 and breath sounds checked- equal and bilateral Secured at: 21 cm Tube secured with: Tape Dental Injury: Teeth and Oropharynx as per pre-operative assessment

## 2017-02-01 NOTE — Op Note (Signed)
Attending: Dr. Era Bumpers  First Assistant: Liliane Bade PA  Anesthesia: GETA  Monitoring: Motor and Somatosensory evoked potentials  Estimated Blood Loss: 50cc  Crystalloid: 1150cc  Specimen: none  Preoperative Diagnosis: Cervical radiculopathy with herniated nucleus pulposus  Postoperative Diagnosis: same  Procedure: Anterior Cervical Discectomy and Fusion C6/7.   Indications: Persistent Left C7 radiculopathy despite conservative measures being maximized. Imaging revealed left paracentral/foraminal disc protrusion with mass effect in the lateral recess and foramen.  Findings: HNP eccentric to left and behind unco-vertebral joint  Risks of surgery:  Risks of surgery discussed include: infection, stroke, coma, death, paralysis, CSF leak, numbness, tingling, weakness, spinal cord/nerve root injury, hematoma formation, C5 palsy, esophageal injury, dysphagia, ventilator dependence, hoarseness of voice, recurrent laryngeal nerve injury, Horner's syndrome, CSF leak, decreased range of motion, vertebral/carotid artery injury, jugular vein injury, neck pain, persistent symptoms, need for further surgery, adjacent segment disease, failed hardware, pseudoarthrosis, malpositioned hardware, tracheal injury, seroma, and risks of anesthesia. They understood these risks and have agreed to proceed.  Operative Note:   The patient was brought from the preoperative center with intravenous access.  The patient was then transferred to the operative table and underwent general anesthesia.  The neck was kept in a neutral position for intubation. Once this was complete all pressure points were appropriately padded as well as a shoulder bump being placed beneath the patient in a gel headrest to facilitate cervical lordosis..  Once this was complete foot fluoroscopy was brought into the field and with the use of a localizing needle we were able to then localize on the surface of the skin for planned incision.   The shoulders were then taped to the table for further exposure as well as Kerlex placed along the wrists bilaterally.  No monitoring changes were noted once, positioning was complete.  A planned C6 horizontal neck incision was established with a marking pen.  Sterile prep and drape were then applied and a timeout was then observed.  Perioperative antibiotics were administered. Due to her history of diabetes, no steroids were administered.   Once timeout was complete and #10 blade knife was then used to incise to the subcutaneous tissue and hemostasis was obtained with a bipolar.  The subcutaneous tissue layer was then mobilized with Metzenbaum dissection and a wheat Lander retractor was advanced.  The platysma layer was identified elevated coagulated and divided with the use of both bipolar cauterization and Metzenbaum scissors.  The subplatysmal layer was then mobilized in similar fashion to facilitate exposure.  The sternocleidomastoid muscle was visualized laterally as well as the medial strap muscles. A branch of the external jugular vein was noted to be oozing. This stopped with manual compression as well as use of surgifoam and patty compression. No residual oozing was noted throughout and completion of the procedure. The carotid artery was palpated with blunt finger dissection.  With the use of a hand-held Cloward and Kitner dissection as well as blunt finger dissection the prevertebral fascia was identified and the esophagus was mobilized medially.  The longus coli muscles were visualized bilaterally.  Fluoroscopy was then brought back into the field under sterile conditions we localized to the C6/7 level disc space and this was also delineated with a marking pen the wound was then irrigated.  The retractor system was then brought into the field and the esophagus was retracted medially and vasculature retracted laterally.  Bovie cauterization was then used to mobilize the longus coli muscles bilaterally  to expose the uncovertebral joints  as well as the corresponding vertebral bodies.  Distraction posts were then mounted into the rostral and caudal vertebral bodies.  Once distraction was applied and no changes were noted and neuro monitoring attention was then turned to the discectomy portion of the procedure.A #15 blade knife was then used to incise through the annulus and a pituitary rongeur was used to remove disc space contents which was sent for permanent pathology a 3-0 straight and up angled curette was used to remove remove further disc space contents extending to the uncovertebral joint as well as removing the cartilaginous endplates of the rostral and caudal vertebral bodies.  #2 and  #3 biting Kerrison was then used to remove the anterior osteophytes of the vertebral bodies as well as with the use of an orbital rongeur.  Once this was complete with the use of a #3 matchstick drill bit the posterior osteophytes were removed in conjunction with a #1 and #2 biting Kerrison once the posterior longitudinal ligament was visualized this was elevated with a blunt right angle micro-nerve hook as well as with a 6 open angle curette and resected with a #1 and #2 biting Kerrisons extending to the medial uncovertebral joint. This was extended to the left foramen due to her preoperative symptoms. A small disc fragment was noted and dissected with a 6-0 up angled curette and resected with a micropituitary rongeur. No evidence of CSF leak was noted.   Hemostasis was obtained with Surgifoam and a half by half patty further foraminal decompression was completed with a #1 biting Kerrison punch until a blunt right angle ball tip feeler could be passed to the frame without any residual compression this was completed bilaterally as well as rostral and caudal to the vertebral bodies in conjunction with removal of the posterior osteophyte.  Once the dura was visualized a Valsalva maneuver was performed and no evidence of CSF  leak was noted, attention was then turned to the arthrodesis portion of the procedure. A 74mm trial was then advanced and subsequently a 54mm, and with the 50mm having a strong fit a 14mm size interbody graft malleted in position.  No changes in neuro monitoring were noted once this was complete the distraction posts were removed and hemostasis were obtained with Surgifoam.  A 39mm  size plate was then positioned and a 89mm size drill was used for plate prior to placement of hardware. 4 - 4x46mm variable Angled screws were positioned into the vertebral body and final tightening was completed as well as with the locking mechanism.  Fluoroscopy was then brought into the field and confirmed placement of hardware and interbody devices.  Once this was complete the wound was copiously irrigated and attention was then turned to closure the longus coli muscle was inspected and coagulated for hemostasis.  The branch of the external jugular vein was again inspected and found to have no residual oozing. The platysmal layer was reapproximated using 3-0 Vicryl suture in a running fashion subcutaneous tissue layers reapproximated using 3-0 Vicryl suture in a simple inverted fashion and the skin was closed closed with Dermabond for reapproximation.  All counts were correct at the end of this case and no neuro monitoring changes were noted.  The patient was then awakened from anesthesia and taken to recovery.   I was present for all portions of the procedure and completed them with the assistance of Liliane Bade PA.  Era Bumpers, MD

## 2017-02-01 NOTE — Progress Notes (Signed)
Pt. Re-examined. Radicular pain resolved. Mild pain at incision controlled with cold pack. Strength improved as well as sensation. Tolerating liquids and crackers. Incision: flat, soft, mild bruising at edges. No evidence of hematoma or midline shift.  Pt. Desiring to go home, xrays reviewed with her. Will clear for DC today and arrange outpatient follow up  Era Bumpers, MD

## 2017-02-01 NOTE — Anesthesia Postprocedure Evaluation (Signed)
Anesthesia Post Note  Patient: Kelly Matthews  Procedure(s) Performed: Procedure(s) (LRB): ANTERIOR CERVICAL DECOMPRESSION/DISCECTOMY FUSION 1 LEVEL (N/A)  Patient location during evaluation: PACU Anesthesia Type: General Level of consciousness: awake Pain management: pain level controlled Vital Signs Assessment: post-procedure vital signs reviewed and stable Respiratory status: spontaneous breathing Cardiovascular status: stable Anesthetic complications: no     Last Vitals:  Vitals:   02/01/17 1156 02/01/17 1217  BP: 132/77 (!) 160/99  Pulse: 87 86  Resp: 13 16  Temp: 36.7 C 36.3 C    Last Pain:  Vitals:   02/01/17 1217  TempSrc: Temporal  PainSc: 1                  VAN STAVEREN,Lyncoln Maskell

## 2017-02-01 NOTE — H&P (Signed)
Referring Physician:  Sabas Sous, DO [address], [place] [address]  Primary Physician:  Greggory Brandy, [facility]  Chief Complaint: Bilateral neck and arm pain  History of Present Illness: Kelly Matthews is a 51 y.o. female who presents with the chief complaint of bilateral neck pain that initially started on the right in December 2017. She denies any specific trauma heavy lifting or straining event leading to this. The pain then extended to her left side of her neck going down her left index finger. She denies any motor difficulty on the right but on the left she is having difficulty with fine motor skills and dexterity. She denies any lower extremity numbness tingling or weakness. She was placed on gabapentin oxycodone and a steroid pack which caused substantial difficulty with control over her blood sugars which she is following her primary care for in this regard. She did not receive any epidural steroid injection secondary to this response. She has not had any prior cervical spine surgery. She is not currently using any tobacco products  She presents back today for signing of consent as well as questions regarding her upcoming surgery. Since her last visit she is having some right arm symptoms as well. She states her blood sugar control is improved to the 140s over the past few weeks versus being in the 300s. She states she also saw cardiology preoperatively as well and was told she was clear for surgery.  Review of Systems:  A 10 point review of systems is negative, except for the pertinent positives and negatives detailed in the HPI.  Past Medical History:  Past Medical History:  Diagnosis Date  . Allergic rhinitis   . Aortic valve stenosis   . Diabetes (CMS-HCC)   . Fatty metamorphosis of liver   . Hypertension   . Migraine   . Tachycardia, unspecified     Past Surgical History:  Past Surgical History:  Procedure  Laterality Date  . Breast Biopsy    . CESAREAN SECTION      Allergies:  Allergies as of 12/15/2016  . (No Known Allergies)    Medications: EncounterMedications   Outpatient Encounter Prescriptions as of 12/15/2016  Medication Sig Dispense Refill  . amitriptyline (ELAVIL) 10 MG tablet TAKE 1 TABLET (10 MG TOTAL) BY MOUTH 3 (THREE) TIMES DAILY.    Marland Kitchen diltiazem (DILT-XR) 120 MG XR capsule Take 1 capsule (120 mg total) by mouth once daily. 90 capsule 4  . fluticasone (FLONASE) 50 mcg/actuation nasal spray Place 2 sprays into both nostrils once daily.  5  . gabapentin (NEURONTIN) 300 MG capsule 1 po qHS x 4 days, then bid 60 capsule 5  . HYDROcodone-acetaminophen (NORCO) 5-325 mg tablet Take 1 tablet by mouth 2 (two) times daily as needed. 40 tablet 0  . insulin ASPART (NOVOLOG) injection (concentration 100 units/mL) Inject subcutaneously.    . insulin GLARGINE (LANTUS) injection (concentration 100 units/mL) Inject subcutaneously.    Marland Kitchen KLOR-CON M20 20 mEq ER tablet TAKE 1 TABLET (20 MEQ TOTAL) BY MOUTH 2 (TWO) TIMES DAILY.  0  . metFORMIN (GLUCOPHAGE) 500 MG tablet Take 500 mg by mouth 2 (two) times daily.  3  . montelukast (SINGULAIR) 10 mg tablet Take by mouth.    . oxyCODONE-acetaminophen (PERCOCET) 7.5-325 mg tablet 1 po bid prn 60 tablet 0  . [DISCONTINUED] diazePAM (VALIUM) 5 MG tablet 1 po 40 minutes before MRI. Take 2nd tablet 10 min before MRI if needed. 2 tablet 0  . [  DISCONTINUED] predniSONE (DELTASONE) 5 MG tablet Take as directed - 12 day taper 48 tablet 0   No facility-administered encounter medications on file as of 12/15/2016.     Social History:  Social History  Substance Use Topics  . Smoking status: Never Smoker  . Smokeless tobacco: Never Used  . Alcohol use Not on file    Family Medical History:  Family History  Problem Relation Age of Onset  . Coronary Artery Disease (Blocked arteries around heart) Mother   . Coronary Artery  Disease (Blocked arteries around heart) Father   . Diabetes Father   . Emphysema Father   . Cancer Maternal Grandmother   . Seizures Daughter     Physical Examination:    General: Patient is well developed, well nourished, calm, collected, and in no apparent distress.  Psychiatric: Patient is non-anxious.  Head: Pupils equal, round, and reactive to light.  ENT: Oral mucosa appears well hydrated.  Neck: Supple. Full range of motion.  Respiratory: Patient is breathing without any difficulty.  Extremities: No edema.  Vascular: Palpable pulses.  Skin: On exposed skin, there are no abnormal skin lesions.  NEUROLOGICAL:  General: In no acute distress.  Awake, alert, oriented to person, place, and time. Pupils equal round and reactive to light. Facial tone is symmetric. Tongue protrusion is midline. There is no pronator drift.  ROM of spine:full. Palpation of spine: no TTP Lhermitte neg Spurling + left.   Strength: Side Biceps Triceps Deltoid Interossei Grip Wrist Ext. Wrist Flex.  R 5 5 5 5 5 5 5   L 5 5 5 5 5 5 5    Side Iliopsoas Quads Hamstring PF DF EHL  R 5 5 5 5 5 5   L 5 5 5 5 5 5    Cardiac: S1/S2 auscultated and RRR. Pulmonary: Clear breath sounds b/l  Reflexes are 2+ and symmetric at the biceps, triceps, brachioradialis, patella and Bilateral upper and lower extremity sensation is intact to light touch and pin prick. Clonus is not present. Toes are down-going. Gait is normal. N Hoffman's is absent.  Imaging: Disc levels:  C2-C3: Mild right uncovertebral and facet hypertrophy. No significant stenosis.  C3-C4: Subtle central disc protrusion (series 7, image 7). No stenosis.  C4-C5: Mild facet hypertrophy. No stenosis.  C5-C6: Minimal central disc protrusion (series 7, image 15). Disc space loss. No stenosis.  C6-C7: Broad-based and lobulated posterior disc extrusion with both left and right lateral recess components of disc  (series 3, images 5-9 and series 6, image 19). Mild ligament flavum hypertrophy. Spinal stenosis with up to mild spinal cord mass effect. Moderate to severe left neural foraminal stenosis. Moderate to severe more proximal right foraminal stenosis.  C7-T1: Mild facet hypertrophy. No stenosis.  No upper thoracic spinal or foraminal stenosis.  IMPRESSION: 1. C6-C7 disc degeneration with lobulated disc herniation affecting the C7 neural foramina left worse than right. Spinal stenosis with mild spinal cord mass effect but no cord signal abnormality. 2. Minimal to mild cervical spine degeneration otherwise. No acute osseous abnormality.  I have personally reviewed the images and agree with the above interpretation.  Assessment and Plan: Ms. Delagarza is a pleasant 51 y.o. female with lateral cervical radiculopathy with a large disc protrusion at C6/7 causing significant canal and foraminal narrowing. She has had difficulty managing the sequela of her current conservative measures therefore we have discussed surgical intervention with a C6/7 anterior cervical discectomy and fusion. I discussed the risk and benefits of surgery and will need optimization  of her blood sugar in conjunction with her primary care doctor as well as clearance by cardiology for her known supraventricular tachycardia. She has agreed to proceed with the C6/7 anterior cervical discectomy and fusion pain continues despite conservative measures. I will have her obtain her preoperative labs and ensure her blood glucose is optimized prior to surgery. Opportunity to ask questions and all were answered   Thank you for involving me in the care of this patient. I will keep you apprised of the patient's progress.

## 2017-02-01 NOTE — Anesthesia Preprocedure Evaluation (Signed)
Anesthesia Evaluation  Patient identified by MRN, date of birth, ID band Patient awake    Reviewed: Allergy & Precautions, NPO status , Patient's Chart, lab work & pertinent test results  Airway Mallampati: II       Dental  (+) Teeth Intact   Pulmonary asthma , former smoker,     + decreased breath sounds      Cardiovascular Exercise Tolerance: Good hypertension, Pt. on medications + dysrhythmias Supra Ventricular Tachycardia  Rhythm:Regular     Neuro/Psych  Headaches,    GI/Hepatic negative GI ROS, Neg liver ROS,   Endo/Other  diabetes, Type 1, Insulin DependentMorbid obesity  Renal/GU negative Renal ROS     Musculoskeletal   Abdominal (+) + obese,   Peds  Hematology negative hematology ROS (+)   Anesthesia Other Findings   Reproductive/Obstetrics                             Anesthesia Physical Anesthesia Plan  ASA: III  Anesthesia Plan: General   Post-op Pain Management:    Induction: Intravenous  Airway Management Planned: Oral ETT  Additional Equipment:   Intra-op Plan:   Post-operative Plan: Extubation in OR  Informed Consent: I have reviewed the patients History and Physical, chart, labs and discussed the procedure including the risks, benefits and alternatives for the proposed anesthesia with the patient or authorized representative who has indicated his/her understanding and acceptance.     Plan Discussed with: CRNA  Anesthesia Plan Comments:         Anesthesia Quick Evaluation

## 2017-02-01 NOTE — Transfer of Care (Signed)
Immediate Anesthesia Transfer of Care Note  Patient: Kelly Matthews  Procedure(s) Performed: Procedure(s): ANTERIOR CERVICAL DECOMPRESSION/DISCECTOMY FUSION 1 LEVEL (N/A)  Patient Location: PACU  Anesthesia Type:General  Level of Consciousness: awake, alert , oriented and patient cooperative  Airway & Oxygen Therapy: Patient Spontanous Breathing and Patient connected to nasal cannula oxygen  Post-op Assessment: Report given to RN and Post -op Vital signs reviewed and stable  Post vital signs: Reviewed and stable  Last Vitals:  Vitals:   02/01/17 0607  BP: (!) 169/104  Pulse: 93  Resp: 18  Temp: 36.5 C    Last Pain:  Vitals:   02/01/17 0607  TempSrc: Tympanic  PainSc: 7          Complications: No apparent anesthesia complications

## 2017-02-02 ENCOUNTER — Encounter: Payer: Self-pay | Admitting: Neurological Surgery

## 2017-02-04 ENCOUNTER — Encounter: Payer: Self-pay | Admitting: Neurological Surgery

## 2017-02-05 ENCOUNTER — Telehealth: Payer: Self-pay | Admitting: Family Medicine

## 2017-02-05 NOTE — Telephone Encounter (Signed)
Pt states she is being discharged from [facility] for swelling and bruising from neck surgery.  I have scheduled a hospital follow up appointment.  Pt states they will send records/MW

## 2017-02-05 NOTE — Telephone Encounter (Signed)
Will review when the records come.

## 2017-02-05 NOTE — Telephone Encounter (Signed)
Please review-aa 

## 2017-02-09 ENCOUNTER — Encounter: Payer: Self-pay | Admitting: Family Medicine

## 2017-02-09 ENCOUNTER — Ambulatory Visit (INDEPENDENT_AMBULATORY_CARE_PROVIDER_SITE_OTHER): Payer: Managed Care, Other (non HMO) | Admitting: Family Medicine

## 2017-02-09 VITALS — BP 148/88 | HR 97 | Temp 98.3°F | Wt 216.6 lb

## 2017-02-09 DIAGNOSIS — E65 Localized adiposity: Secondary | ICD-10-CM | POA: Diagnosis not present

## 2017-02-09 DIAGNOSIS — E118 Type 2 diabetes mellitus with unspecified complications: Secondary | ICD-10-CM | POA: Diagnosis not present

## 2017-02-09 DIAGNOSIS — R238 Other skin changes: Secondary | ICD-10-CM | POA: Diagnosis not present

## 2017-02-09 DIAGNOSIS — Z794 Long term (current) use of insulin: Secondary | ICD-10-CM

## 2017-02-09 DIAGNOSIS — R233 Spontaneous ecchymoses: Secondary | ICD-10-CM

## 2017-02-09 DIAGNOSIS — Z981 Arthrodesis status: Secondary | ICD-10-CM | POA: Diagnosis not present

## 2017-02-09 NOTE — Progress Notes (Signed)
Patient: Kelly Matthews Female    DOB: 09/24/1966   51 y.o.   MRN: [id number] Visit Date: 02/09/2017  Today's Provider: [name], PA   Chief Complaint  Patient presents with  . Hospitalization Follow-up  . Follow-up   Subjective:    HPI Patient is here to follow up from anterior cervical decompression/discectomy fusion 1 level surgery that was done on 02/01/2017 by Dr. Aris Lot. Patient complains of extreme swelling and bruising around surgical site down to chest area. Per patient she seen Dr. Aris Lot on Wednesday for the same compliant,and he recommended she follow up with PCP for further testing to see why she bruises so easily.   Past Medical History:  Diagnosis Date  . 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 Diabetes mellitus without complication ([place])   . Hypertension   . Psoriasis (a type of skin inflammation)   . Tachycardia    Past Surgical History:  Procedure Laterality Date  . ANTERIOR CERVICAL DECOMP/DISCECTOMY FUSION N/A 02/01/2017   Procedure: ANTERIOR CERVICAL DECOMPRESSION/DISCECTOMY FUSION 1 LEVEL;  Surgeon: Bayard Hugger, MD;  Location: [facility] ORS;  Service: Neurosurgery;  Laterality: N/A;  . BREAST BIOPSY Left 04/18/2015   Benign calcification  . CESAREAN SECTION  1990,2001  . INTRAUTERINE DEVICE INSERTION  October 2015   [facility]   Family History  Problem Relation Age of Onset  . Coronary artery disease Mother   . Coronary artery disease Father   . Diabetes Father   . Emphysema Father   . Seizures Daughter   . Cancer Maternal Grandmother   . Breast cancer Neg Hx    Allergies  Allergen Reactions  . Oxycodone Itching     Previous Medications   AMITRIPTYLINE (ELAVIL) 10 MG TABLET    TAKE 1 TABLET (10 MG TOTAL) BY MOUTH 3 (THREE) TIMES DAILY.   B-D INS SYR ULTRAFINE 1CC/31G 31G X 5/16" 1 ML MISC    USE ONE SYRINGE FOR EACH DOSAGE (NOVOLOG THREE TIMES A DAY AND LANTUS ONCE A DAY)   DILTIAZEM (DILACOR XR) 120 MG 24 HR CAPSULE    Take 120 mg by  mouth every morning.    FLUTICASONE (FLONASE) 50 MCG/ACT NASAL SPRAY    SPRAY 2 SPRAYS IN EACH NOSTRIL DAILY   GABAPENTIN (NEURONTIN) 300 MG CAPSULE    Take 300 mg by mouth 2 (two) times daily.   HYDROCODONE-ACETAMINOPHEN (NORCO) 5-325 MG TABLET    Take 1 tablet by mouth every 6 (six) hours as needed for moderate pain.   HYDROCODONE-ACETAMINOPHEN (NORCO/VICODIN) 5-325 MG TABLET    Take 1 tablet by mouth every 6 (six) hours as needed for moderate pain.   INSULIN GLARGINE (TOUJEO SOLOSTAR) 300 UNIT/ML SOPN    Inject 60 Units into the skin daily.   INSULIN PEN NEEDLE 31G X 5 MM MISC    Use pen needles for Toujeo and Novolog Pen as directed on those prescriptions.   INSULIN SYRINGES, DISPOSABLE, U-100 1 ML MISC    USE ONE SYRINGE FOR EACH DOSAGE (NOVOLOG THREE TIMES A DAY AND LANTUS ONCE A DAY)   METFORMIN (GLUCOPHAGE) 500 MG TABLET    TAKE 1 TABLET (500 MG TOTAL) BY MOUTH 2 (TWO) TIMES DAILY.   METHOCARBAMOL (ROBAXIN) 500 MG TABLET    Take 1 tablet (500 mg total) by mouth 3 (three) times daily.   MONTELUKAST (SINGULAIR) 10 MG TABLET    TAKE 1 TABLET BY MOUTH EVERY DAY   NOVOLOG FLEXPEN 100 UNIT/ML FLEXPEN    CHECK BLOOD SUGAR  BEFORE MEALS,6UNITS IF 151-200,10 UNITS IF 201-250,12UNITS IF 251-300,14UNITS>300   OMEPRAZOLE (PRILOSEC) 40 MG CAPSULE    Take 1 capsule (40 mg total) by mouth daily.   ONE TOUCH ULTRA TEST TEST STRIP    USE 1 STRIP THREE TIMES DAILY AS NEEDED TO ADJUST INSULIN DOSAGE   ONETOUCH DELICA LANCETS 56Y MISC    TEST BLOOD SUGAR THREE TIMES A DAY TO ADJUST INSULIN DOSAGE   OXYCODONE-ACETAMINOPHEN (PERCOCET) 7.5-325 MG TABLET    Take 1 tablet by mouth every 8 (eight) hours as needed for severe pain.   POTASSIUM CHLORIDE (KLOR-CON) 20 MEQ PACKET    Take 40 mEq by mouth daily.   POTASSIUM CHLORIDE SA (K-DUR,KLOR-CON) 20 MEQ TABLET    Take 1 tablet (20 mEq total) by mouth 2 (two) times daily.   SUMATRIPTAN (IMITREX) 50 MG TABLET    Take one tablet by mouth at onset of migraine. May repeat  in 2 hours if headache persists or recurs. Limit 2 doses in 24 hours.    Review of Systems  Constitutional: Negative.   Respiratory: Negative.   Cardiovascular: Negative.   Skin:       Swelling, bruising, and surgical wound     Social History  Substance Use Topics  . Smoking status: Former Smoker    Years: 1.00    Quit date: 01/19/1985  . Smokeless tobacco: Never Used  . Alcohol use No     Comment: occasionally 1 beer every 6 months   Objective:   BP (!) 148/88 (BP Location: Right Arm, Patient Position: Sitting, Cuff Size: Normal)   Pulse 97   Temp 98.3 F (36.8 C) (Oral)   Wt 216 lb 9.6 oz (98.2 kg)   SpO2 99%   BMI 43.75 kg/m   Physical Exam  Constitutional: She is oriented to person, place, and time. She appears well-developed and well-nourished. No distress.  HENT:  Head: Normocephalic and atraumatic.  Right Ear: Hearing normal.  Left Ear: Hearing normal.  Nose: Nose normal.  Eyes: Conjunctivae and lids are normal. Right eye exhibits no discharge. Left eye exhibits no discharge. No scleral icterus.  Neck: Neck supple.  Cardiovascular: Normal rate and regular rhythm.   Pulmonary/Chest: Effort normal and breath sounds normal. No respiratory distress.  Abdominal: Soft. She exhibits no mass.  Musculoskeletal: Normal range of motion.  Neurological: She is alert and oriented to person, place, and time.  Skin: Skin is intact. No lesion noted.  Large hematoma under anterior cervical fusion scar at the left base of the neck. Deep dark blue ecchymosis under chin from ear to ear. Yellowish bruises on the upper chest above breasts.  Psychiatric: She has a normal mood and affect. Her speech is normal and behavior is normal. Thought content normal.      Assessment & Plan:     1. Easy bruising Severe bruising with hematoma under cervical spine fusion scar left anterior neck. Dr. Aris Lot (neurosurgeon) concerned about possible Cushing's Disease or clotting disorder. Will get  cortisol, PTH, PTT, protime/INR and CBC. May need evaluation by endocrinologist or hematologist pending reports. - Cortisol - PTH, Intact and Calcium - PTT - INR/PT - CBC with Differential/Platelet  2. S/P cervical spinal fusion Accomplished on 02-01-17 with much improved strength and sensation in the left arm. Complication of extreme bruising around the neck and upper chest with hematoma under incision. No swallowing or breathing issues today and feels the bruising and the hematoma are diminishing. Continue follow up with neurosurgeon (Dr. Aris Lot) as planned.  3. Type 2 diabetes mellitus with complication, with long-term current use of insulin (HCC) Having FBS in the 100-124 range recently. Back on the Toujeo 60 units qd since discharge after cervical spine fusion at C6-7 level. Still taking Metformin 500 mg BID and Novolog by sliding scale TID. Continue diabetic diet and follow up with Dr. Gabriel Carina (endocrinologist) 02-22-17. Hoping to switch off the insulins to oral medications if control maintained.  4. Truncal obesity With heavy bruising post operatively, will check PTH and cortisol level. May need to recheck with endocrinologist (Dr. Gabriel Carina) to rule out adrenal disease and Cushing's Disease. - Cortisol - PTH, Intact and Calcium - CBC with Differential/Platelet

## 2017-02-10 NOTE — Telephone Encounter (Signed)
Pt called saying [facility] took her off the metformin. Because of test they were doing.  She wants to know if it is ok to go back on the metformin  Please advise.  Thanks  [employer]

## 2017-02-10 NOTE — Telephone Encounter (Signed)
Patient advised.

## 2017-02-10 NOTE — Telephone Encounter (Signed)
Yes. Dr. Joycie Peek last note said she wanted her to continue the Metformin with her insulins for now. It was stopped because of the scans done with contrast for her neck. Be sure to monitor FBS regularly to assess the effect of restarting the Metformin.

## 2017-02-12 LAB — PROTIME-INR
INR: 0.9 (ref 0.8–1.2)
Prothrombin Time: 10 s (ref 9.1–12.0)

## 2017-02-12 LAB — CBC WITH DIFFERENTIAL/PLATELET
BASOS: 0 %
Basophils Absolute: 0 10*3/uL (ref 0.0–0.2)
EOS (ABSOLUTE): 0.1 10*3/uL (ref 0.0–0.4)
EOS: 1 %
HEMATOCRIT: 38.9 % (ref 34.0–46.6)
HEMOGLOBIN: 13.1 g/dL (ref 11.1–15.9)
Immature Grans (Abs): 0.1 10*3/uL (ref 0.0–0.1)
Immature Granulocytes: 1 %
LYMPHS ABS: 2.5 10*3/uL (ref 0.7–3.1)
Lymphs: 21 %
MCH: 28.8 pg (ref 26.6–33.0)
MCHC: 33.7 g/dL (ref 31.5–35.7)
MCV: 86 fL (ref 79–97)
MONOCYTES: 7 %
MONOS ABS: 0.8 10*3/uL (ref 0.1–0.9)
NEUTROS ABS: 8.7 10*3/uL — AB (ref 1.4–7.0)
Neutrophils: 70 %
Platelets: 319 10*3/uL (ref 150–379)
RBC: 4.55 x10E6/uL (ref 3.77–5.28)
RDW: 14.2 % (ref 12.3–15.4)
WBC: 12.2 10*3/uL — AB (ref 3.4–10.8)

## 2017-02-12 LAB — PTH, INTACT AND CALCIUM
Calcium: 10.2 mg/dL (ref 8.7–10.2)
PTH: 28 pg/mL (ref 15–65)

## 2017-02-12 LAB — APTT: APTT: 27 s (ref 24–33)

## 2017-02-12 LAB — CORTISOL: CORTISOL: 16.5 ug/dL

## 2017-03-16 ENCOUNTER — Encounter: Payer: Self-pay | Admitting: Family Medicine

## 2017-03-16 ENCOUNTER — Ambulatory Visit (INDEPENDENT_AMBULATORY_CARE_PROVIDER_SITE_OTHER): Payer: Managed Care, Other (non HMO) | Admitting: Family Medicine

## 2017-03-16 VITALS — BP 138/92 | HR 108 | Temp 98.5°F | Resp 16 | Wt 221.6 lb

## 2017-03-16 DIAGNOSIS — H66002 Acute suppurative otitis media without spontaneous rupture of ear drum, left ear: Secondary | ICD-10-CM | POA: Diagnosis not present

## 2017-03-16 DIAGNOSIS — J069 Acute upper respiratory infection, unspecified: Secondary | ICD-10-CM | POA: Diagnosis not present

## 2017-03-16 MED ORDER — AMOXICILLIN-POT CLAVULANATE 875-125 MG PO TABS: 1.0000 | ORAL_TABLET | Freq: Two times a day (BID) | ORAL | 0 refills | Status: DC

## 2017-03-16 NOTE — Progress Notes (Signed)
Subjective:     Patient ID: Kelly Matthews, female   DOB: [birth year], 51 y.o.   MRN: [id number]  HPI  Chief Complaint  Patient presents with  . Ear Pain    Patient comes in office today with concerns of left ear pain for the psat 5hrs. Patient reports that within the last 24hrs she has had the following symptoms: sneezing, runny nose, green nasal drainage, sinus pain & pressure, sore throat and cough. Patient has been taking presciption Singulair and otc Benadryl.   Feels like her allergies were under control prior to the onset of symptoms. States hearing is muffled in the left ear. Remains on diltiazem for palpitations.   Review of Systems     Objective:   Physical Exam  Constitutional: She appears well-developed and well-nourished. No distress.  Ears: Right TM intact without inflammation. Left ear without tragal tenderness; TM is inflamed with the appearance of several vesicles overlying. No drainage noted. Throat: no tonsillar enlargement or exudate Neck: no cervical adenopathy Lungs: clear     Assessment:    1. Viral upper respiratory tract infection  2. Acute suppurative otitis media of left ear without spontaneous rupture of tympanic membrane, recurrence not specified    Plan:    Discussed use of Tylenol, saline nasal spray, Mucinex and Delsym. F/u in one week to re-examine her left ear.

## 2017-03-16 NOTE — Patient Instructions (Signed)
Continue tylenol for discomfort. May use Delsym for cough, saline nasal spray and Mucinex as expectorant. We would like to look at your ear again in a week.

## 2017-03-22 ENCOUNTER — Encounter: Payer: Self-pay | Admitting: Family Medicine

## 2017-03-22 ENCOUNTER — Ambulatory Visit (INDEPENDENT_AMBULATORY_CARE_PROVIDER_SITE_OTHER): Payer: Managed Care, Other (non HMO) | Admitting: Family Medicine

## 2017-03-22 ENCOUNTER — Other Ambulatory Visit: Payer: Self-pay | Admitting: Family Medicine

## 2017-03-22 VITALS — BP 144/78 | HR 118 | Temp 98.1°F | Wt 220.0 lb

## 2017-03-22 DIAGNOSIS — H66012 Acute suppurative otitis media with spontaneous rupture of ear drum, left ear: Secondary | ICD-10-CM

## 2017-03-22 DIAGNOSIS — Z1231 Encounter for screening mammogram for malignant neoplasm of breast: Secondary | ICD-10-CM

## 2017-03-22 NOTE — Progress Notes (Signed)
Patient: Kelly Matthews Female    DOB: 1966/10/25   51 y.o.   MRN: [id number] Visit Date: 03/22/2017  Today's Provider: [name], PA   Chief Complaint  Patient presents with  . Ear Pain  . Follow-up   Subjective:    Otalgia   There is pain in the left ear. This is a recurrent problem. The current episode started 1 to 4 weeks ago (Patient seen Mikki Santee on 03/16/2017. Started treatment. Pain subsided on the 3rd day then returned this weekend). The problem occurs constantly. Pain severity now: fullness and muffled. Treatments tried: Tylenol, saline nasal spray, Mucinex, Delsym, and Augmentin. The treatment provided mild relief.   Patient Active Problem List   Diagnosis Date Noted  . Malnutrition of moderate degree 12/07/2016  . DKA (diabetic ketoacidoses) ([place]) 12/05/2016  . Migraine headache 05/05/2016  . Allergic rhinitis 07/23/2015  . Excess, menstruation 07/19/2015  . 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 Blood glucose elevated 07/19/2015  . Airway hyperreactivity 07/19/2015  . Disorder of esophagus 07/19/2015  . Hypercholesterolemia without hypertriglyceridemia 07/19/2015  . Borderline diabetes 07/19/2015  . Diabetes ([place]) 11/28/2014  . Essential (primary) hypertension 11/28/2014  . Aortic heart valve narrowing 11/28/2014  . 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 Supraventricular tachycardia ([place]) 11/28/2014  . 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 ANTERIOR CERVICAL DECOMP/DISCECTOMY FUSION N/A 02/01/2017   Procedure: ANTERIOR CERVICAL DECOMPRESSION/DISCECTOMY FUSION 1 LEVEL;  Surgeon: Bayard Hugger, MD;  Location: [facility] ORS;  Service: Neurosurgery;  Laterality: N/A;  . BREAST BIOPSY Left 04/18/2015   Benign calcification  . CESAREAN SECTION  1990,2001  . INTRAUTERINE DEVICE INSERTION  October 2015   [facility]   Family History  Problem Relation Age of Onset  . Coronary  artery disease Mother   . Coronary artery disease Father   . Diabetes Father   . Emphysema Father   . Seizures Daughter   . Cancer Maternal Grandmother   . Breast cancer Neg Hx    Allergies  Allergen Reactions  . Oxycodone Itching     Previous Medications   AMITRIPTYLINE (ELAVIL) 10 MG TABLET    TAKE 1 TABLET (10 MG TOTAL) BY MOUTH 3 (THREE) TIMES DAILY.   AMOXICILLIN-CLAVULANATE (AUGMENTIN) 875-125 MG TABLET    Take 1 tablet by mouth 2 (two) times daily.   B-D INS SYR ULTRAFINE 1CC/31G 31G X 5/16" 1 ML MISC    USE ONE SYRINGE FOR EACH DOSAGE (NOVOLOG THREE TIMES A DAY AND LANTUS ONCE A DAY)   CLOBETASOL (TEMOVATE) 0.05 % EXTERNAL SOLUTION    APPLY TWICE DAILY TO SCALP FOR ITCH   CLOBETASOL PROPIONATE 0.05 % SHAMPOO    SHAMPOO DAILY. LEAVE IN PLACE FOR 30 MINUTES AND RINSE   DAPAGLIFLOZIN-METFORMIN HCL ER 03-999 MG TB24    Take by mouth.   DILTIAZEM (DILACOR XR) 120 MG 24 HR CAPSULE    Take 120 mg by mouth every morning.    FLUTICASONE (FLONASE) 50 MCG/ACT NASAL SPRAY    SPRAY 2 SPRAYS IN EACH NOSTRIL DAILY   GABAPENTIN (NEURONTIN) 300 MG CAPSULE    Take 300 mg by mouth 2 (two) times daily.   GABAPENTIN (NEURONTIN) 300 MG CAPSULE    1 po bid   HYDROCODONE-ACETAMINOPHEN (NORCO) 5-325 MG TABLET    Take 1 tablet by mouth every 6 (six) hours as needed for moderate pain.   INSULIN GLARGINE (TOUJEO SOLOSTAR) 300 UNIT/ML SOPN  Inject 60 Units into the skin daily.   INSULIN GLARGINE 300 UNIT/ML SOPN    Inject into the skin.   INSULIN PEN NEEDLE 31G X 5 MM MISC    Use pen needles for Toujeo and Novolog Pen as directed on those prescriptions.   INSULIN SYRINGES, DISPOSABLE, U-100 1 ML MISC    USE ONE SYRINGE FOR EACH DOSAGE (NOVOLOG THREE TIMES A DAY AND LANTUS ONCE A DAY)   METHOCARBAMOL (ROBAXIN) 500 MG TABLET    Take 1 tablet (500 mg total) by mouth 3 (three) times daily.   MONTELUKAST (SINGULAIR) 10 MG TABLET    Take by mouth.   NOVOLOG FLEXPEN 100 UNIT/ML FLEXPEN    CHECK BLOOD SUGAR  BEFORE MEALS,6UNITS IF 151-200,10 UNITS IF 201-250,12UNITS IF 251-300,14UNITS>300   OMEPRAZOLE (PRILOSEC) 40 MG CAPSULE    Take 1 capsule (40 mg total) by mouth daily.   ONE TOUCH ULTRA TEST TEST STRIP    USE 1 STRIP THREE TIMES DAILY AS NEEDED TO ADJUST INSULIN DOSAGE   ONETOUCH DELICA LANCETS 10G MISC    TEST BLOOD SUGAR THREE TIMES A DAY TO ADJUST INSULIN DOSAGE    Review of Systems  Constitutional: Negative.   HENT: Positive for ear pain.   Respiratory: Negative.   Cardiovascular: Negative.     Social History  Substance Use Topics  . Smoking status: Former Smoker    Years: 1.00    Quit date: 01/19/1985  . Smokeless tobacco: Never Used  . Alcohol use No     Comment: occasionally 1 beer every 6 months   Objective:   BP (!) 144/78 (BP Location: Right Arm, Patient Position: Sitting, Cuff Size: Normal)   Pulse (!) 118   Temp 98.1 F (36.7 C) (Oral)   Wt 220 lb (99.8 kg)   SpO2 98%   BMI 44.43 kg/m   Physical Exam  Constitutional: She is oriented to person, place, and time. She appears well-developed and well-nourished. No distress.  HENT:  Head: Normocephalic and atraumatic.  Right Ear: Hearing and external ear normal.  Left Ear: Hearing and external ear normal.  Nose: Nose normal.  Reddened left TM with some bulging. No drainage.  Eyes: Conjunctivae and lids are normal. Right eye exhibits no discharge. Left eye exhibits no discharge. No scleral icterus.  Neck: Neck supple.  Cardiovascular: Regular rhythm.   Pulmonary/Chest: Effort normal and breath sounds normal. No respiratory distress.  Abdominal: Soft. Bowel sounds are normal.  Musculoskeletal: Normal range of motion.  Lymphadenopathy:    She has no cervical adenopathy.  Neurological: She is alert and oriented to person, place, and time.  Skin: Skin is intact. No lesion and no rash noted.  Psychiatric: She has a normal mood and affect. Her speech is normal and behavior is normal. Thought content normal.        Assessment & Plan:     1. Acute suppurative otitis media of left ear with spontaneous rupture of tympanic membrane, recurrence not specified URI symptoms improved. Still has some allergic rhinitis in the Spring. Left TM slightly reddened and bulging . Recommend continuing the Augmentin and adding Flonase nasal spray daily. Recheck prn.

## 2017-03-22 NOTE — Patient Instructions (Signed)

## 2017-03-25 ENCOUNTER — Ambulatory Visit (INDEPENDENT_AMBULATORY_CARE_PROVIDER_SITE_OTHER): Payer: Managed Care, Other (non HMO) | Admitting: Family Medicine

## 2017-03-25 ENCOUNTER — Encounter: Payer: Self-pay | Admitting: Family Medicine

## 2017-03-25 VITALS — BP 142/94 | HR 95 | Temp 98.0°F | Wt 217.4 lb

## 2017-03-25 DIAGNOSIS — H66002 Acute suppurative otitis media without spontaneous rupture of ear drum, left ear: Secondary | ICD-10-CM | POA: Diagnosis not present

## 2017-03-25 DIAGNOSIS — H6982 Other specified disorders of Eustachian tube, left ear: Secondary | ICD-10-CM | POA: Diagnosis not present

## 2017-03-25 MED ORDER — AMOXICILLIN-POT CLAVULANATE 875-125 MG PO TABS: 1.0000 | ORAL_TABLET | Freq: Two times a day (BID) | ORAL | 0 refills | Status: DC

## 2017-03-25 NOTE — Progress Notes (Signed)
Patient: Kelly Matthews Female    DOB: 07/15/1966   51 y.o.   MRN: [id number] Visit Date: 03/25/2017  Today's Provider: [name], PA   Chief Complaint  Patient presents with  . Ear Fullness  . Follow-up   Subjective:    HPI Otalgia Follow Up:  3 day follow up. Last seen on 03/22/2017. Patient advised to use Flonase and continue antibiotic. Patient reports good compliance with treatment plan. Symptoms are unchanged. She reports muffled sounds and ear fullness in left ear. Patient has one dose of antibiotic left.   Patient Active Problem List   Diagnosis Date Noted  . Malnutrition of moderate degree 12/07/2016  . DKA (diabetic ketoacidoses) ([place]) 12/05/2016  . Migraine headache 05/05/2016  . Allergic rhinitis 07/23/2015  . Excess, menstruation 07/19/2015  . 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 Blood glucose elevated 07/19/2015  . Airway hyperreactivity 07/19/2015  . Disorder of esophagus 07/19/2015  . Hypercholesterolemia without hypertriglyceridemia 07/19/2015  . Borderline diabetes 07/19/2015  . Diabetes ([place]) 11/28/2014  . Essential (primary) hypertension 11/28/2014  . Aortic heart valve narrowing 11/28/2014  . 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 Supraventricular tachycardia ([place]) 11/28/2014  . 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 ANTERIOR CERVICAL DECOMP/DISCECTOMY FUSION N/A 02/01/2017   Procedure: ANTERIOR CERVICAL DECOMPRESSION/DISCECTOMY FUSION 1 LEVEL;  Surgeon: Bayard Hugger, MD;  Location: [facility] ORS;  Service: Neurosurgery;  Laterality: N/A;  . BREAST BIOPSY Left 04/18/2015   Benign calcification  . CESAREAN SECTION  1990,2001  . INTRAUTERINE DEVICE INSERTION  October 2015   [facility]   Allergies  Allergen Reactions  . Oxycodone Itching     Previous Medications   AMITRIPTYLINE (ELAVIL) 10 MG TABLET    TAKE 1 TABLET (10 MG TOTAL) BY MOUTH 3 (THREE)  TIMES DAILY.   AMOXICILLIN-CLAVULANATE (AUGMENTIN) 875-125 MG TABLET    Take 1 tablet by mouth 2 (two) times daily.   B-D INS SYR ULTRAFINE 1CC/31G 31G X 5/16" 1 ML MISC    USE ONE SYRINGE FOR EACH DOSAGE (NOVOLOG THREE TIMES A DAY AND LANTUS ONCE A DAY)   CLOBETASOL (TEMOVATE) 0.05 % EXTERNAL SOLUTION    APPLY TWICE DAILY TO SCALP FOR ITCH   CLOBETASOL PROPIONATE 0.05 % SHAMPOO    SHAMPOO DAILY. LEAVE IN PLACE FOR 30 MINUTES AND RINSE   DAPAGLIFLOZIN-METFORMIN HCL ER 03-999 MG TB24    Take by mouth.   DILTIAZEM (DILACOR XR) 120 MG 24 HR CAPSULE    Take 120 mg by mouth every morning.    FLUTICASONE (FLONASE) 50 MCG/ACT NASAL SPRAY    SPRAY 2 SPRAYS IN EACH NOSTRIL DAILY   GABAPENTIN (NEURONTIN) 300 MG CAPSULE    Take 300 mg by mouth 2 (two) times daily.   GABAPENTIN (NEURONTIN) 300 MG CAPSULE    1 po bid   HYDROCODONE-ACETAMINOPHEN (NORCO) 5-325 MG TABLET    Take 1 tablet by mouth every 6 (six) hours as needed for moderate pain.   INSULIN GLARGINE (TOUJEO SOLOSTAR) 300 UNIT/ML SOPN    Inject 60 Units into the skin daily.   INSULIN GLARGINE 300 UNIT/ML SOPN    Inject into the skin.   INSULIN PEN NEEDLE 31G X 5 MM MISC    Use pen needles for Toujeo and Novolog Pen as directed on those prescriptions.   INSULIN SYRINGES, DISPOSABLE, U-100 1 ML MISC    USE ONE SYRINGE FOR EACH DOSAGE (NOVOLOG THREE TIMES  A DAY AND LANTUS ONCE A DAY)   METHOCARBAMOL (ROBAXIN) 500 MG TABLET    Take 1 tablet (500 mg total) by mouth 3 (three) times daily.   MONTELUKAST (SINGULAIR) 10 MG TABLET    Take by mouth.   NOVOLOG FLEXPEN 100 UNIT/ML FLEXPEN    CHECK BLOOD SUGAR BEFORE MEALS,6UNITS IF 151-200,10 UNITS IF 201-250,12UNITS IF 251-300,14UNITS>300   OMEPRAZOLE (PRILOSEC) 40 MG CAPSULE    Take 1 capsule (40 mg total) by mouth daily.   ONE TOUCH ULTRA TEST TEST STRIP    USE 1 STRIP THREE TIMES DAILY AS NEEDED TO ADJUST INSULIN DOSAGE   ONETOUCH DELICA LANCETS 21J MISC    TEST BLOOD SUGAR THREE TIMES A DAY TO ADJUST  INSULIN DOSAGE    Review of Systems  Constitutional: Negative.   HENT: Ear pain: ear fullness.   Respiratory: Negative.   Cardiovascular: Negative.     Social History  Substance Use Topics  . Smoking status: Former Smoker    Years: 1.00    Quit date: 01/19/1985  . Smokeless tobacco: Never Used  . Alcohol use No     Comment: occasionally 1 beer every 6 months   Objective:   BP (!) 142/94 (BP Location: Right Arm, Patient Position: Sitting, Cuff Size: Normal)   Pulse 95   Temp 98 F (36.7 C) (Oral)   Wt 217 lb 6.4 oz (98.6 kg)   SpO2 99%   BMI 43.91 kg/m   Physical Exam  Constitutional: She appears well-developed and well-nourished.  HENT:  Head: Normocephalic.  Right Ear: External ear normal.  Nose: Nose normal.  Mouth/Throat: Oropharynx is clear and moist.  Pink hazy left TM. AC>BC to 256 fork on the right. BC>AC on the left with lateralization to the left.  Eyes: Conjunctivae are normal.  Neck: Neck supple.  Cardiovascular: Normal rate and regular rhythm.   Pulmonary/Chest: Effort normal and breath sounds normal.  Abdominal: Soft.      Assessment & Plan:     1. Acute suppurative otitis media of left ear without spontaneous rupture of tympanic membrane, recurrence not specified Some improvement but still has intermittent throbbing sensation in the left ear with diminished hearing. No fever or URI symptoms. 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 Will continue Flonase and add antihistamine. Report progress in 10 days when second round of antibiotic finished. If no better, will need referral to ENT. - amoxicillin-clavulanate (AUGMENTIN) 875-125 MG tablet; Take 1 tablet by mouth 2 (two) times daily.  Dispense: 20 tablet; Refill: 0  2. Dysfunction of left eustachian tube Weber test lateralizes to the left and Rinne test negative on the left. Indicates conductive loss probably from eustachian congestion and persistent TM irritation. Continue treatment as  above and call report of progress in 10 days.

## 2017-04-13 ENCOUNTER — Inpatient Hospital Stay: Admission: RE | Admit: 2017-04-13 | Payer: Managed Care, Other (non HMO) | Source: Ambulatory Visit

## 2017-04-28 ENCOUNTER — Other Ambulatory Visit: Payer: Self-pay | Admitting: Family Medicine

## 2017-05-14 ENCOUNTER — Encounter: Payer: Self-pay | Admitting: Obstetrics and Gynecology

## 2017-05-14 ENCOUNTER — Ambulatory Visit (INDEPENDENT_AMBULATORY_CARE_PROVIDER_SITE_OTHER): Payer: Managed Care, Other (non HMO) | Admitting: Obstetrics and Gynecology

## 2017-05-14 VITALS — BP 124/74 | Ht 59.0 in | Wt 223.0 lb

## 2017-05-14 DIAGNOSIS — Z01419 Encounter for gynecological examination (general) (routine) without abnormal findings: Secondary | ICD-10-CM

## 2017-05-14 DIAGNOSIS — Z124 Encounter for screening for malignant neoplasm of cervix: Secondary | ICD-10-CM | POA: Diagnosis not present

## 2017-05-14 DIAGNOSIS — B373 Candidiasis of vulva and vagina: Secondary | ICD-10-CM | POA: Diagnosis not present

## 2017-05-14 DIAGNOSIS — B3731 Acute candidiasis of vulva and vagina: Secondary | ICD-10-CM

## 2017-05-14 DIAGNOSIS — Z1331 Encounter for screening for depression: Secondary | ICD-10-CM

## 2017-05-14 DIAGNOSIS — Z1389 Encounter for screening for other disorder: Secondary | ICD-10-CM | POA: Diagnosis not present

## 2017-05-14 DIAGNOSIS — Z1339 Encounter for screening examination for other mental health and behavioral disorders: Secondary | ICD-10-CM

## 2017-05-14 MED ORDER — FLUCONAZOLE 150 MG PO TABS: 150.0000 mg | ORAL_TABLET | ORAL | 0 refills | Status: AC

## 2017-05-14 NOTE — Progress Notes (Signed)
Routine Annual Gynecology Examination   PCP: Margo Common, PA  Chief Complaint  Patient presents with  . Annual Exam    History of Present Illness: Patient is a 51 y.o. [id number] presents for annual exam. The patient has no complaints today.   Menses are absent due to IUD.  Menopausal symptoms: reports more frequent hot flashes, but tolerable.  Breast symptoms: denies  Last pap smear: 2 years ago.  Result Normal  Last mammogram: 1 years ago.  Result Normal  Past Medical History:  Diagnosis Date  . 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 Diabetes mellitus without complication ([place])   . Hypertension   . Psoriasis (a type of skin inflammation)   . Tachycardia     Past Surgical History:  Procedure Laterality Date  . ANTERIOR CERVICAL DECOMP/DISCECTOMY FUSION N/A 02/01/2017   Procedure: ANTERIOR CERVICAL DECOMPRESSION/DISCECTOMY FUSION 1 LEVEL;  Surgeon: Bayard Hugger, MD;  Location: [facility] ORS;  Service: Neurosurgery;  Laterality: N/A;  . BREAST BIOPSY Left 04/18/2015   Benign calcification  . CESAREAN SECTION  1990,2001  . INTRAUTERINE DEVICE INSERTION  October 2015   [facility]    Prior to Admission medications   Medication Sig Start Date End Date Taking? Authorizing Provider  amitriptyline (ELAVIL) 10 MG tablet TAKE 1 TABLET (10 MG TOTAL) BY MOUTH 3 (THREE) TIMES DAILY. 04/29/17   Chrismon, Vickki Muff, PA  amoxicillin-clavulanate (AUGMENTIN) 875-125 MG tablet Take 1 tablet by mouth 2 (two) times daily. 03/25/17   Chrismon, Vickki Muff, PA  B-D INS SYR ULTRAFINE 1CC/31G 31G X 5/16" 1 ML MISC USE ONE SYRINGE FOR EACH DOSAGE (NOVOLOG THREE TIMES A DAY AND LANTUS ONCE A DAY) 12/10/16   [provider]  clobetasol (TEMOVATE) 0.05 % external solution APPLY TWICE DAILY TO SCALP FOR [facility] 02/24/17   [provider]  Clobetasol Propionate 0.05 % shampoo SHAMPOO DAILY. LEAVE IN PLACE FOR 30 MINUTES AND RINSE 02/24/17   [provider]  Dapagliflozin-Metformin  HCl ER 03-999 MG TB24 Take by mouth. 02/22/17   [provider]  diltiazem (DILACOR XR) 120 MG 24 hr capsule Take 120 mg by mouth every morning.  12/03/14   [provider]  fluticasone (FLONASE) 50 MCG/ACT nasal spray SPRAY 2 SPRAYS IN EACH NOSTRIL DAILY Patient taking differently: Place 1-2 sprays into both nostrils daily as needed for allergies.  07/26/15   Chrismon, Vickki Muff, PA  gabapentin (NEURONTIN) 300 MG capsule Take 300 mg by mouth 2 (two) times daily.    [provider]  gabapentin (NEURONTIN) 300 MG capsule 1 po bid 02/22/17   [provider]  HYDROcodone-acetaminophen (NORCO) 5-325 MG tablet Take 1 tablet by mouth every 6 (six) hours as needed for moderate pain. 02/01/17   Drinkwater, Fleeta Emmer, PA-C  Insulin Glargine (TOUJEO SOLOSTAR) 300 UNIT/ML SOPN Inject 60 Units into the skin daily. Patient taking differently: Inject 60 Units into the skin at bedtime.  12/25/16   Chrismon, Simona Huh E, PA  Insulin Glargine 300 UNIT/ML SOPN Inject into the skin. 02/22/17   [provider]  Insulin Pen Needle 31G X 5 MM MISC Use pen needles for Toujeo and Novolog Pen as directed on those prescriptions. 12/25/16   Chrismon, Simona Huh E, PA  Insulin Syringes, Disposable, U-100 1 ML MISC USE ONE SYRINGE FOR EACH DOSAGE (NOVOLOG THREE TIMES A DAY AND LANTUS ONCE A DAY) 12/10/16   Chrismon, Vickki Muff, PA  methocarbamol (ROBAXIN) 500 MG tablet Take 1 tablet (500 mg total) by mouth  3 (three) times daily. 02/01/17   Drinkwater, Fleeta Emmer, PA-C  montelukast (SINGULAIR) 10 MG tablet Take by mouth. 04/01/15   [provider]  NOVOLOG FLEXPEN 100 UNIT/ML FlexPen CHECK BLOOD SUGAR BEFORE MEALS,6UNITS IF 151-200,10 UNITS IF 829-937,16RCVEL IF 251-300,14UNITS>300 01/25/17   Chrismon, Vickki Muff, PA  omeprazole (PRILOSEC) 40 MG capsule Take 1 capsule (40 mg total) by mouth daily. 02/01/17   Drinkwater, Fleeta Emmer, PA-C  ONE TOUCH ULTRA TEST test strip USE 1 STRIP THREE TIMES DAILY AS NEEDED TO ADJUST  INSULIN DOSAGE 12/10/16   Chrismon, Vickki Muff, PA  ONETOUCH DELICA LANCETS 38B MISC TEST BLOOD SUGAR THREE TIMES A DAY TO ADJUST INSULIN DOSAGE 12/10/16   Chrismon, Vickki Muff, PA    Allergies  Allergen Reactions  . Oxycodone Itching    Gynecologic History:  No LMP recorded. Patient is not currently having periods (Reason: IUD). Contraception: IUD  Obstetric History: G2P2002  Social History   Social History  . Marital status: Widowed    Spouse name: N/A  . Number of children: N/A  . Years of education: N/A   Occupational History  . Not on file.   Social History Main Topics  . Smoking status: Former Smoker    Years: 1.00    Quit date: 01/19/1985  . Smokeless tobacco: Never Used  . Alcohol use No     Comment: occasionally 1 beer every 6 months  . Drug use: No  . Sexual activity: Yes   Other Topics Concern  . Not on file   Social History Narrative  . No narrative on file    Family History  Problem Relation Age of Onset  . Coronary artery disease Mother   . Coronary artery disease Father   . Diabetes Father   . Emphysema Father   . Seizures Daughter   . Cancer Maternal Grandmother   . Breast cancer Neg Hx     Review of Systems  Constitutional: Negative.   HENT: Negative.   Eyes: Negative.   Respiratory: Negative.   Cardiovascular: Negative.   Gastrointestinal: Negative.   Genitourinary: Negative.   Musculoskeletal: Negative.   Skin: Negative.   Neurological: Negative.   Psychiatric/Behavioral: Negative.      Physical Exam Vitals: BP 124/74   Ht 4\' 11"  (1.499 m)   Wt 223 lb (101.2 kg)   BMI 45.04 kg/m   Physical Exam  Constitutional: She is oriented to person, place, and time. She appears well-developed and well-nourished. No distress.  Genitourinary: Vagina normal and uterus normal. Pelvic exam was performed with patient supine. There is no rash, tenderness, lesion or injury on the right labia.  There is rash on the left labia. There is no tenderness,  lesion or injury on the left labia.    Vagina exhibits no lesion. No erythema or bleeding in the vagina. No vaginal discharge found. Right adnexum does not display mass, does not display tenderness and does not display fullness. Left adnexum does not display mass, does not display tenderness and does not display fullness. Cervix does not exhibit motion tenderness, lesion, discharge or polyp.   Uterus is mobile and anteverted. Uterus is not enlarged, tender, exhibiting a mass or irregular (is regular).  Genitourinary Comments: Internal (bimanual) limited by body habitus  HENT:  Head: Normocephalic and atraumatic.  Eyes: EOM are normal. No scleral icterus.  Neck: Normal range of motion. Neck supple. No thyromegaly present.  Cardiovascular: Normal rate, regular rhythm, normal heart sounds and intact distal pulses.  Exam reveals no  gallop and no friction rub.   No murmur heard. Pulmonary/Chest: Effort normal and breath sounds normal. No respiratory distress. She has no wheezes. She has no rales.  Abdominal: Soft. Bowel sounds are normal. She exhibits no distension and no mass. There is no tenderness. There is no rebound and no guarding.  Musculoskeletal: Normal range of motion. She exhibits no edema.  Lymphadenopathy:    She has no cervical adenopathy.  Neurological: She is alert and oriented to person, place, and time. No cranial nerve deficit.  Skin: Skin is warm and dry. No erythema.  Psychiatric: She has a normal mood and affect. Her behavior is normal. Judgment normal.     Female chaperone present for pelvic and breast  portions of the physical exam  Results: AUDIT Questionnaire (screen for alcoholism): negative PHQ-9: negative   Assessment and Plan:  51 y.o. G11P2002 female here for routine annual gynecologic examination  Plan: Problem List Items Addressed This Visit    Women's annual routine gynecological examination - Primary   Relevant Orders   IGP, Aptima HPV, rfx 16/18,45      Other Visit Diagnoses    Pap smear for cervical cancer screening       Relevant Orders   IGP, Aptima HPV, rfx 16/18,45   Candidal vulvovaginitis       Will treat with oral fluconazole x 2 doses 72 hours apart.  If does not improve within 2 weeks, patient to return to clinic for biopsy.    Relevant Medications   fluconazole (DIFLUCAN) 150 MG tablet   Screening for depression       Screening for alcohol problem          Screening: -- Blood pressure screen managed by PCP -- Colonoscopy - per PCP -- Mammogram - due. Patient to call [place] to arrange. She understands that it is her responsibility to arrange this. -- Weight screening: to be managed by PCP -- Depression screening negative (PHQ-9) -- Nutrition: normal -- cholesterol screening: per PCP -- osteoporosis screening: not due -- tobacco screening: not using -- alcohol screening: AUDIT questionnaire indicates low-risk usage. -- family history of breast cancer screening: done. not at high risk. -- no evidence of domestic violence or intimate partner violence. -- STD screening: gonorrhea/chlamydia NAAT not collected per patient request. -- pap smear collected per ASCCP guidelines -- HPV vaccination series: not eligilbe   Prentice Docker, MD 05/14/17  4:54 PM

## 2017-05-17 LAB — HM PAP SMEAR: HM PAP: NEGATIVE

## 2017-05-18 ENCOUNTER — Telehealth: Payer: Self-pay | Admitting: Family Medicine

## 2017-05-18 NOTE — Telephone Encounter (Signed)
-----   Message from Ola Spurr sent at 05/18/2017  8:07 AM EDT ----- Review incoming fax

## 2017-05-18 NOTE — Telephone Encounter (Signed)
Patient advised of letter in media section of chart from [facility]. She will contact them to see what services they offer. Contact number given to patient for [employer] health advocate.

## 2017-05-20 LAB — IGP, APTIMA HPV, RFX 16/18,45
HPV APTIMA: NEGATIVE
PAP Smear Comment: 0

## 2017-05-25 ENCOUNTER — Telehealth: Payer: Self-pay | Admitting: Obstetrics and Gynecology

## 2017-05-25 LAB — HEMOGLOBIN A1C: Hemoglobin A1C: 6.1

## 2017-05-25 NOTE — Telephone Encounter (Signed)
Pt is returning missed call. Please call patient

## 2017-05-25 NOTE — Telephone Encounter (Signed)
Left generic VM 

## 2017-05-26 ENCOUNTER — Encounter: Payer: Self-pay | Admitting: Obstetrics and Gynecology

## 2017-05-26 NOTE — Telephone Encounter (Signed)
Pt is calling back to speak with Dr. Glennon Mac about her Labs result.

## 2017-05-26 NOTE — Telephone Encounter (Signed)
Spoke w patient and gave her results. Per the ASCCP guidelines, pap smear with cotesting in one year. All questions answered.

## 2017-05-26 NOTE — Telephone Encounter (Signed)
Returned call. No answer. Left generic VM. I also responded to a message from the patient through [place].

## 2017-06-18 ENCOUNTER — Ambulatory Visit
Admission: RE | Admit: 2017-06-18 | Discharge: 2017-06-18 | Disposition: A | Discharge status: Home / Self Care | Payer: Managed Care, Other (non HMO) | Source: Ambulatory Visit | Attending: Family Medicine | Admitting: Family Medicine

## 2017-06-18 DIAGNOSIS — Z1231 Encounter for screening mammogram for malignant neoplasm of breast: Secondary | ICD-10-CM | POA: Insufficient documentation

## 2017-06-21 ENCOUNTER — Telehealth: Payer: Self-pay

## 2017-06-21 NOTE — Telephone Encounter (Signed)
Pt advised and agrees with treatment plan. 

## 2017-06-21 NOTE — Telephone Encounter (Signed)
-----   Message from Margo Common, [place] sent at 06/18/2017  6:32 PM EDT ----- No sign of malignancies on mammogram per radiologist. He suggests annual mammogram follow up.

## 2017-08-06 ENCOUNTER — Encounter: Payer: Self-pay | Admitting: Family Medicine

## 2017-08-06 ENCOUNTER — Ambulatory Visit (INDEPENDENT_AMBULATORY_CARE_PROVIDER_SITE_OTHER): Payer: Managed Care, Other (non HMO) | Admitting: Family Medicine

## 2017-08-06 VITALS — BP 102/72 | HR 94 | Temp 98.4°F | Wt 217.6 lb

## 2017-08-06 DIAGNOSIS — Z23 Encounter for immunization: Secondary | ICD-10-CM | POA: Diagnosis not present

## 2017-08-06 DIAGNOSIS — E118 Type 2 diabetes mellitus with unspecified complications: Secondary | ICD-10-CM | POA: Diagnosis not present

## 2017-08-06 DIAGNOSIS — M779 Enthesopathy, unspecified: Secondary | ICD-10-CM | POA: Diagnosis not present

## 2017-08-06 DIAGNOSIS — Z794 Long term (current) use of insulin: Secondary | ICD-10-CM

## 2017-08-06 DIAGNOSIS — M778 Other enthesopathies, not elsewhere classified: Secondary | ICD-10-CM

## 2017-08-06 LAB — POCT UA - MICROALBUMIN: MICROALBUMIN (UR) POC: 100 mg/L

## 2017-08-06 NOTE — Progress Notes (Signed)
Patient: Kelly Matthews Female    DOB: [birth year]   51 y.o.   MRN: [id number] Visit Date: 08/06/2017  Today's Provider: [name], PA   Chief Complaint  Patient presents with  . lump on forearm   Subjective:    HPI Patient is here today to discuss a lump on left forearm. She states she noticed the lump yesterday. The area is bruised and has some redness around it.  Patient states the area is painful when she touches it.   Past Medical History:  Diagnosis Date  . 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 Diabetes mellitus without complication ([place])   . Hypertension   . Psoriasis (a type of skin inflammation)   . Tachycardia    Past Surgical History:  Procedure Laterality Date  . ANTERIOR CERVICAL DECOMP/DISCECTOMY FUSION N/A 02/01/2017   Procedure: ANTERIOR CERVICAL DECOMPRESSION/DISCECTOMY FUSION 1 LEVEL;  Surgeon: Bayard Hugger, MD;  Location: [facility] ORS;  Service: Neurosurgery;  Laterality: N/A;  . BREAST BIOPSY Left 04/18/2015   Benign calcification  . CESAREAN SECTION  1990,2001  . INTRAUTERINE DEVICE INSERTION  October 2015   [facility]   Family History  Problem Relation Age of Onset  . Coronary artery disease Mother   . Coronary artery disease Father   . Diabetes Father   . Emphysema Father   . Seizures Daughter   . Cancer Maternal Grandmother   . Breast cancer Neg Hx    Allergies  Allergen Reactions  . Oxycodone Itching    Previous Medications   AMITRIPTYLINE (ELAVIL) 10 MG TABLET    TAKE 1 TABLET (10 MG TOTAL) BY MOUTH 3 (THREE) TIMES DAILY.   B-D INS SYR ULTRAFINE 1CC/31G 31G X 5/16" 1 ML MISC    USE ONE SYRINGE FOR EACH DOSAGE (NOVOLOG THREE TIMES A DAY AND LANTUS ONCE A DAY)   CLOBETASOL (TEMOVATE) 0.05 % EXTERNAL SOLUTION    APPLY TWICE DAILY TO SCALP FOR ITCH   CLOBETASOL PROPIONATE 0.05 % SHAMPOO    SHAMPOO DAILY. LEAVE IN PLACE FOR 30 MINUTES AND RINSE   DAPAGLIFLOZIN-METFORMIN HCL ER 03-999 MG TB24    Take by mouth.   DILTIAZEM (DILACOR XR)  120 MG 24 HR CAPSULE    Take 120 mg by mouth every morning.    FLUTICASONE (FLONASE) 50 MCG/ACT NASAL SPRAY    SPRAY 2 SPRAYS IN EACH NOSTRIL DAILY   GABAPENTIN (NEURONTIN) 300 MG CAPSULE    Take 300 mg by mouth 2 (two) times daily.   GABAPENTIN (NEURONTIN) 300 MG CAPSULE    1 po bid   HYDROCODONE-ACETAMINOPHEN (NORCO) 5-325 MG TABLET    Take 1 tablet by mouth every 6 (six) hours as needed for moderate pain.   METHOCARBAMOL (ROBAXIN) 500 MG TABLET    Take 1 tablet (500 mg total) by mouth 3 (three) times daily.   MONTELUKAST (SINGULAIR) 10 MG TABLET    Take by mouth.   OMEPRAZOLE (PRILOSEC) 40 MG CAPSULE    Take 1 capsule (40 mg total) by mouth daily.   ONE TOUCH ULTRA TEST TEST STRIP    USE 1 STRIP THREE TIMES DAILY AS NEEDED TO ADJUST INSULIN DOSAGE   ONETOUCH DELICA LANCETS 01U MISC    TEST BLOOD SUGAR THREE TIMES A DAY TO ADJUST INSULIN DOSAGE   SITAGLIPTIN (JANUVIA) 100 MG TABLET    Take by mouth.   TELMISARTAN (MICARDIS) 40 MG TABLET    Take by mouth.    Review of Systems  Constitutional: Negative.  Respiratory: Negative.   Cardiovascular: Negative.     Social History  Substance Use Topics  . Smoking status: Former Smoker    Years: 1.00    Quit date: 01/19/1985  . Smokeless tobacco: Never Used  . Alcohol use No     Comment: occasionally 1 beer every 6 months   Objective:   BP 102/72 (BP Location: Right Arm, Patient Position: Sitting, Cuff Size: Normal)   Pulse 94   Temp 98.4 F (36.9 C) (Oral)   Wt 217 lb 9.6 oz (98.7 kg)   SpO2 98%   BMI 43.95 kg/m   Physical Exam  Constitutional: She is oriented to person, place, and time. She appears well-developed and well-nourished. No distress.  HENT:  Head: Normocephalic and atraumatic.  Right Ear: Hearing normal.  Left Ear: Hearing normal.  Nose: Nose normal.  Eyes: Conjunctivae and lids are normal. Right eye exhibits no discharge. Left eye exhibits no discharge. No scleral icterus.  Pulmonary/Chest: Effort normal. No  respiratory distress.  Musculoskeletal: She exhibits tenderness.  Bruising and tenderness with subcutaneous lump in the flexor tendon of the left forearm. Good pulses. Increased discomfort to test grip strength and flexion or extension of the wrist. No numbness.  Neurological: She is alert and oriented to person, place, and time.  Skin: Skin is intact. No lesion and no rash noted.  Psychiatric: She has a normal mood and affect. Her speech is normal and behavior is normal. Thought content normal.      Assessment & Plan:     1. Tendinitis of left forearm Tenderness and bruise volar surface of the left forearm. No known injury. Tender to palpate or test flexor tendon strength. Small nodule felt on the tendon (suspect strain with possible minor tear). May use Aleve for inflammation and wrist splint for support. Limit lifting with the left hand and recheck in 2 weeks if no better.  2. Type 2 diabetes mellitus with complication, with long-term current use of insulin ([place]) Followed by Dr. Gabriel Carina (endocrinologist) and taking Xigduo with Januvia. No longer on insulin and feeling much improved. Microalbumin 100 mg/L today. Continue present regimen and follow up with Dr. Gabriel Carina regularly. - POCT UA - Microalbumin  3. Need for influenza vaccination - Flu Vaccine QUAD 6+ mos PF IM (Fluarix Quad PF)   Follow up: No Follow-up on file.

## 2017-08-06 NOTE — Patient Instructions (Signed)
Wrist Pain, Adult There are many things that can cause wrist pain. Some common causes include:  An injury to the wrist area, such as a sprain, strain, or fracture.  Overuse of the joint.  A condition that causes increased pressure on a nerve in the wrist (carpal tunnel syndrome).  Wear and tear of the joints that occurs with aging (osteoarthritis).  A variety of other types of arthritis.  Sometimes, the cause of wrist pain is not known. Often, the pain goes away when you follow instructions from your health care provider for relieving pain at home, such as resting or icing the wrist. If your wrist pain continues, it is important to tell your health care provider. Follow these instructions at home:  Rest the wrist area for at least 48 hours or as long as told by your health care provider.  If a splint or elastic bandage has been applied, use it as told by your health care provider. ? Remove the splint or bandage only as told by your health care provider. ? Loosen the splint or bandage if your fingers tingle, become numb, or turn cold or blue.  If directed, apply ice to the injured area. ? If you have a removable splint or elastic bandage, remove it as told by your health care provider. ? Put ice in a plastic bag. ? Place a towel between your skin and the bag or between your splint or bandage and the bag. ? Leave the ice on for 20 minutes, 2-3 times a day.  Keep your arm raised (elevated) above the level of your heart while you are sitting or lying down.  Take over-the-counter and prescription medicines only as told by your health care provider.  Keep all follow-up visits as told by your health care provider. This is important. Contact a health care provider if:  You have a sudden sharp pain in the wrist, hand, or arm that is different or new.  The swelling or bruising on your wrist or hand gets worse.  Your skin becomes red, gets a rash, or has open sores.  Your pain does not  get better or it gets worse. Get help right away if:  You lose feeling in your fingers or hand.  Your fingers turn white, very red, or cold and blue.  You cannot move your fingers.  You have a fever or chills. This information is not intended to replace advice given to you by your health care provider. Make sure you discuss any questions you have with your health care provider. Document Released: 08/12/2005 Document Revised: 05/28/2016 Document Reviewed: 05/21/2016 Elsevier Interactive Patient Education  2017 [employer].

## 2017-08-19 ENCOUNTER — Other Ambulatory Visit: Payer: Self-pay | Admitting: Family Medicine

## 2017-08-19 ENCOUNTER — Telehealth: Payer: Self-pay | Admitting: Family Medicine

## 2017-08-19 NOTE — Telephone Encounter (Signed)
[facility] on the following medications:  amitriptyline (ELAVIL) 10 MG tablet.  90 day supply  [facility]

## 2017-08-19 NOTE — Telephone Encounter (Signed)
Please review-Anastasiya V Hopkins, RMA  

## 2017-08-20 ENCOUNTER — Other Ambulatory Visit: Payer: Self-pay | Admitting: Family Medicine

## 2017-08-20 DIAGNOSIS — Z8669 Personal history of other diseases of the nervous system and sense organs: Secondary | ICD-10-CM

## 2017-08-20 MED ORDER — AMITRIPTYLINE HCL 10 MG PO TABS: ORAL_TABLET | ORAL | 6 refills | Status: DC

## 2017-08-30 ENCOUNTER — Encounter: Payer: Self-pay | Admitting: Family Medicine

## 2017-09-03 LAB — BASIC METABOLIC PANEL: Glucose: 109

## 2017-09-03 LAB — HEMOGLOBIN A1C: HEMOGLOBIN A1C: 5.9

## 2017-09-20 ENCOUNTER — Encounter: Payer: Self-pay | Admitting: Family Medicine

## 2017-09-20 ENCOUNTER — Ambulatory Visit: Payer: Managed Care, Other (non HMO) | Admitting: Family Medicine

## 2017-09-20 VITALS — BP 122/60 | HR 100 | Temp 98.4°F | Wt 221.4 lb

## 2017-09-20 DIAGNOSIS — J0100 Acute maxillary sinusitis, unspecified: Secondary | ICD-10-CM | POA: Diagnosis not present

## 2017-09-20 DIAGNOSIS — Z794 Long term (current) use of insulin: Secondary | ICD-10-CM | POA: Diagnosis not present

## 2017-09-20 DIAGNOSIS — E118 Type 2 diabetes mellitus with unspecified complications: Secondary | ICD-10-CM | POA: Diagnosis not present

## 2017-09-20 MED ORDER — AMOXICILLIN 875 MG PO TABS: 875.0000 mg | ORAL_TABLET | Freq: Two times a day (BID) | ORAL | 0 refills | Status: DC

## 2017-09-20 NOTE — Progress Notes (Signed)
Patient: Kelly Matthews Female    DOB: [birth year]   51 y.o.   MRN: [id number] Visit Date: 09/20/2017  Today's Provider: [name], PA   Chief Complaint  Patient presents with  . URI   Subjective:    URI   This is a new problem. Episode onset: Thursday night. The problem has been gradually worsening. There has been no fever. Associated symptoms include congestion, sinus pain and a sore throat. Treatments tried: Allergy medications. The treatment provided mild relief.   Past Medical History:  Diagnosis Date  . 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 Diabetes mellitus without complication ([place])   . Hypertension   . Psoriasis (a type of skin inflammation)   . Tachycardia    Past Surgical History:  Procedure Laterality Date  . BREAST BIOPSY Left 04/18/2015   Benign calcification  . CESAREAN SECTION  1990,2001  . INTRAUTERINE DEVICE INSERTION  October 2015   [facility]   Family History  Problem Relation Age of Onset  . Coronary artery disease Mother   . Coronary artery disease Father   . Diabetes Father   . Emphysema Father   . Seizures Daughter   . Cancer Maternal Grandmother   . Breast cancer Neg Hx      Allergies  Allergen Reactions  . Oxycodone Itching    Current Outpatient Medications:  .  amitriptyline (ELAVIL) 10 MG tablet, TAKE 1 TABLET (10 MG TOTAL) BY MOUTH 3 (THREE) TIMES DAILY., Disp: 90 tablet, Rfl: 6 .  B-D INS SYR ULTRAFINE 1CC/31G 31G X 5/16" 1 ML MISC, USE ONE SYRINGE FOR EACH DOSAGE (NOVOLOG THREE TIMES A DAY AND LANTUS ONCE A DAY), Disp: , Rfl: 3 .  clobetasol (TEMOVATE) 0.05 % external solution, APPLY TWICE DAILY TO SCALP FOR ITCH, Disp: , Rfl: 4 .  Clobetasol Propionate 0.05 % shampoo, SHAMPOO DAILY. LEAVE IN PLACE FOR 30 MINUTES AND RINSE, Disp: , Rfl: 3 .  Dapagliflozin-Metformin HCl ER 03-999 MG TB24, Take by mouth., Disp: , Rfl:  .  diltiazem (DILACOR XR) 120 MG 24 hr capsule, Take 120 mg by mouth every morning. , Disp: ,  Rfl:  .  fluticasone (FLONASE) 50 MCG/ACT nasal spray, SPRAY 2 SPRAYS IN EACH NOSTRIL DAILY (Patient taking differently: Place 1-2 sprays into both nostrils daily as needed for allergies. ), Disp: 16 g, Rfl: 5 .  methocarbamol (ROBAXIN) 500 MG tablet, Take 1 tablet (500 mg total) by mouth 3 (three) times daily., Disp: 60 tablet, Rfl: 0 .  montelukast (SINGULAIR) 10 MG tablet, Take by mouth., Disp: , Rfl:  .  omeprazole (PRILOSEC) 40 MG capsule, Take 1 capsule (40 mg total) by mouth daily., Disp: 30 capsule, Rfl: 0 .  ONE TOUCH ULTRA TEST test strip, USE 1 STRIP THREE TIMES DAILY AS NEEDED TO ADJUST INSULIN DOSAGE, Disp: 300 each, Rfl: 3 .  ONETOUCH DELICA LANCETS 18A MISC, TEST BLOOD SUGAR THREE TIMES A DAY TO ADJUST INSULIN DOSAGE, Disp: 300 each, Rfl: 3 .  sitaGLIPtin (JANUVIA) 100 MG tablet, Take by mouth., Disp: , Rfl:  .  telmisartan (MICARDIS) 40 MG tablet, Take by mouth., Disp: , Rfl:   Review of Systems  Constitutional: Negative.   HENT: Positive for congestion, sinus pain and sore throat.   Respiratory: Negative.   Cardiovascular: Negative.     Social History   Tobacco Use  . Smoking status: Former Smoker    Years: 1.00    Last attempt to quit: 01/19/1985  Years since quitting: 32.6  . Smokeless tobacco: Never Used  Substance Use Topics  . Alcohol use: No    Alcohol/week: 0.0 oz    Comment: occasionally 1 beer every 6 months   Objective:   BP 122/60 (BP Location: Right Arm, Patient Position: Sitting, Cuff Size: Normal)   Pulse 100   Temp 98.4 F (36.9 C) (Oral)   Wt 221 lb 6.4 oz (100.4 kg)   SpO2 98%   BMI 44.72 kg/m  Vitals:   09/20/17 0950  BP: 122/60  Pulse: 100  Temp: 98.4 F (36.9 C)  TempSrc: Oral  SpO2: 98%  Weight: 221 lb 6.4 oz (100.4 kg)   Physical Exam  Constitutional: She is oriented to person, place, and time. She appears well-developed and well-nourished.  HENT:  Head: Normocephalic.  Right Ear: External ear normal.  Left Ear: External ear  normal.  Mouth/Throat: Oropharynx is clear and moist.  Cloudy right maxillary sinus.  Eyes: Conjunctivae are normal.  Neck: Neck supple.  Cardiovascular: Normal rate.  Pulmonary/Chest: Effort normal and breath sounds normal.  Abdominal: Soft.  Lymphadenopathy:    She has no cervical adenopathy.  Neurological: She is alert and oriented to person, place, and time.      Assessment & Plan:     1. Subacute maxillary sinusitis Onset 4 days ago without documented fever. Sinus pressure and congestion right maxillary area with cloudy transillumination. May use Mucinex-DM for cough from PND and Flonase nasal spray. Add Amoxicillin and increase fluid intake. Recheck prn. - amoxicillin (AMOXIL) 875 MG tablet; Take 1 tablet (875 mg total) 2 (two) times daily by mouth.  Dispense: 20 tablet; Refill: 0  2. Type 2 diabetes mellitus with complication, with long-term current use of insulin ([facility]) Well controlled by endocrinologist (Dr. Gabriel Carina). Last Hgb A1C was 5.9% on the Januvia and Xigduo. Continue follow up as planned.      [name], PA  [facility]

## 2017-11-02 ENCOUNTER — Telehealth: Payer: Self-pay | Admitting: Family Medicine

## 2017-11-02 ENCOUNTER — Other Ambulatory Visit: Payer: Self-pay | Admitting: Family Medicine

## 2017-11-02 MED ORDER — ACCU-CHEK SOFT TOUCH LANCETS MISC
3 refills | Status: DC
Start: 2017-11-02 — End: 2023-12-23

## 2017-11-02 MED ORDER — GLUCOSE BLOOD VI STRP: ORAL_STRIP | 3 refills | Status: DC

## 2017-11-02 NOTE — Telephone Encounter (Signed)
Phone in order for Accu Check Avia Plus test strips and soft click lancets to test blood sugar TID to adjust insulin #300 and 3 refills.

## 2017-11-02 NOTE — Telephone Encounter (Signed)
Pt stated that her insurance advised they will no longer cover one touch for testing supplies and sent pt a new meter. Pt is requesting Rx for Accu Check Avia Plus test strips & soft Click lancets [facility] to [facility]. Please advise. Thanks TNP

## 2017-11-02 NOTE — Telephone Encounter (Signed)
[facility]

## 2017-11-12 ENCOUNTER — Other Ambulatory Visit: Payer: Self-pay | Admitting: Family Medicine

## 2017-11-12 DIAGNOSIS — J4520 Mild intermittent asthma, uncomplicated: Secondary | ICD-10-CM

## 2017-11-14 IMAGING — DX DG CERVICAL SPINE 2 OR 3 VIEWS
4 series · 4 of 4 positions shown · non-contrast
Comparison: Cervical MRI November 25, 2016; intraoperative images
obtained earlier in the day

CLINICAL DATA: Postoperative anterior fusion

EXAM:
CERVICAL SPINE - 2-3 VIEW

[c-spine ap]
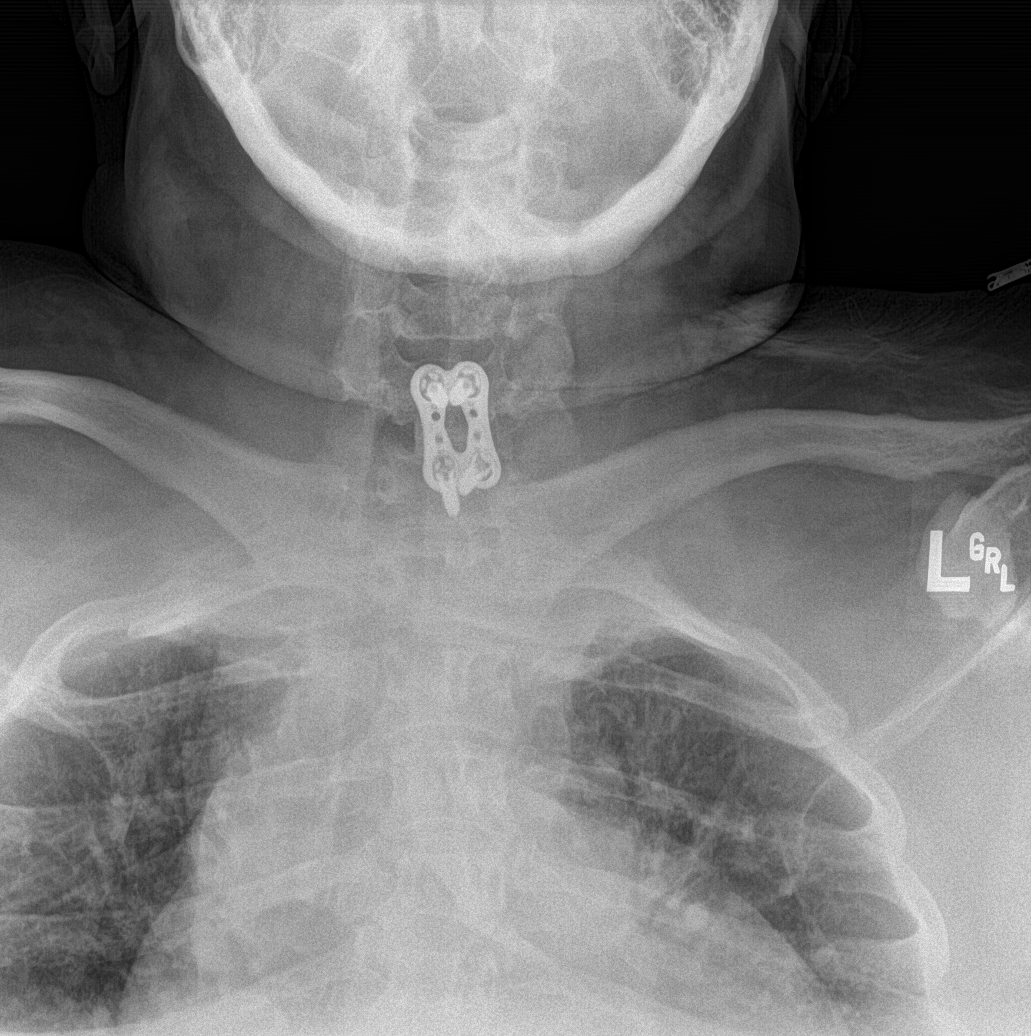

[c-spine lat (1 of 3)]
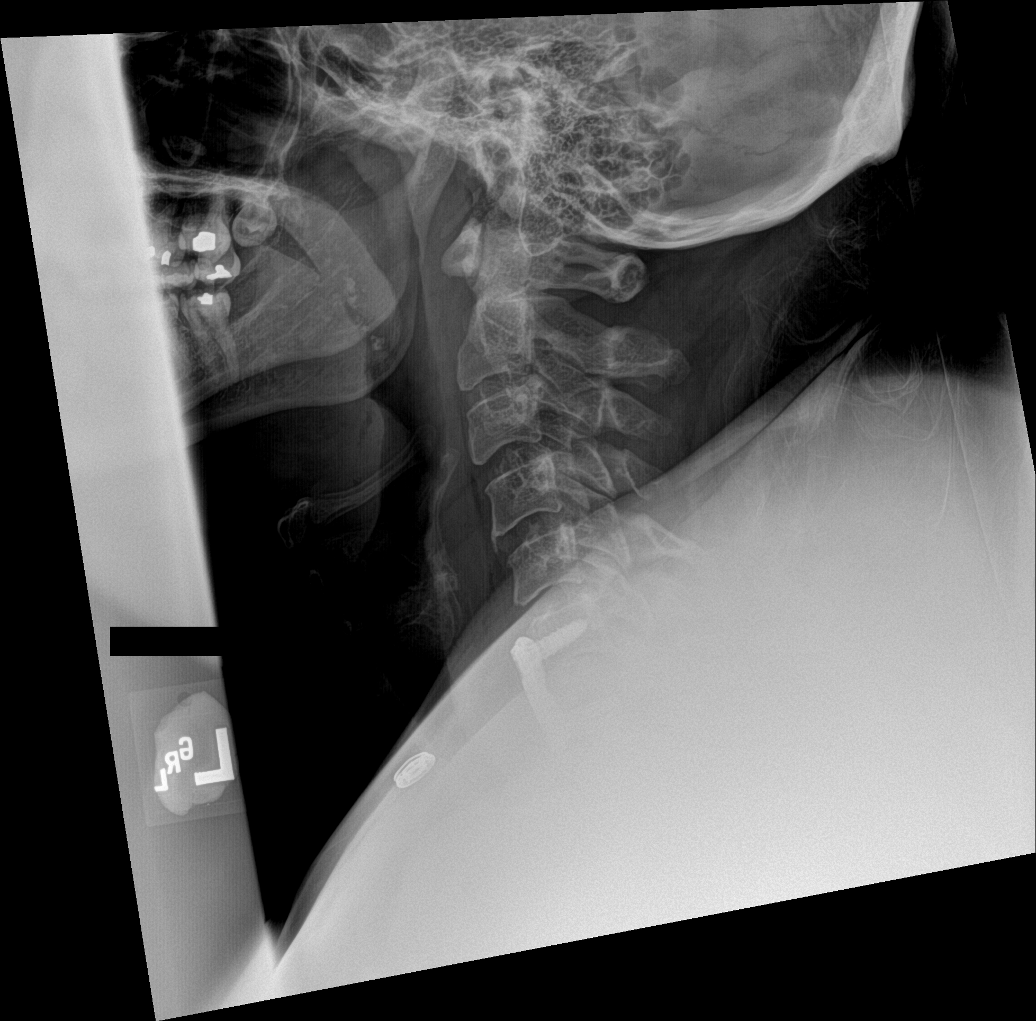

[c-spine lat (2 of 3)]
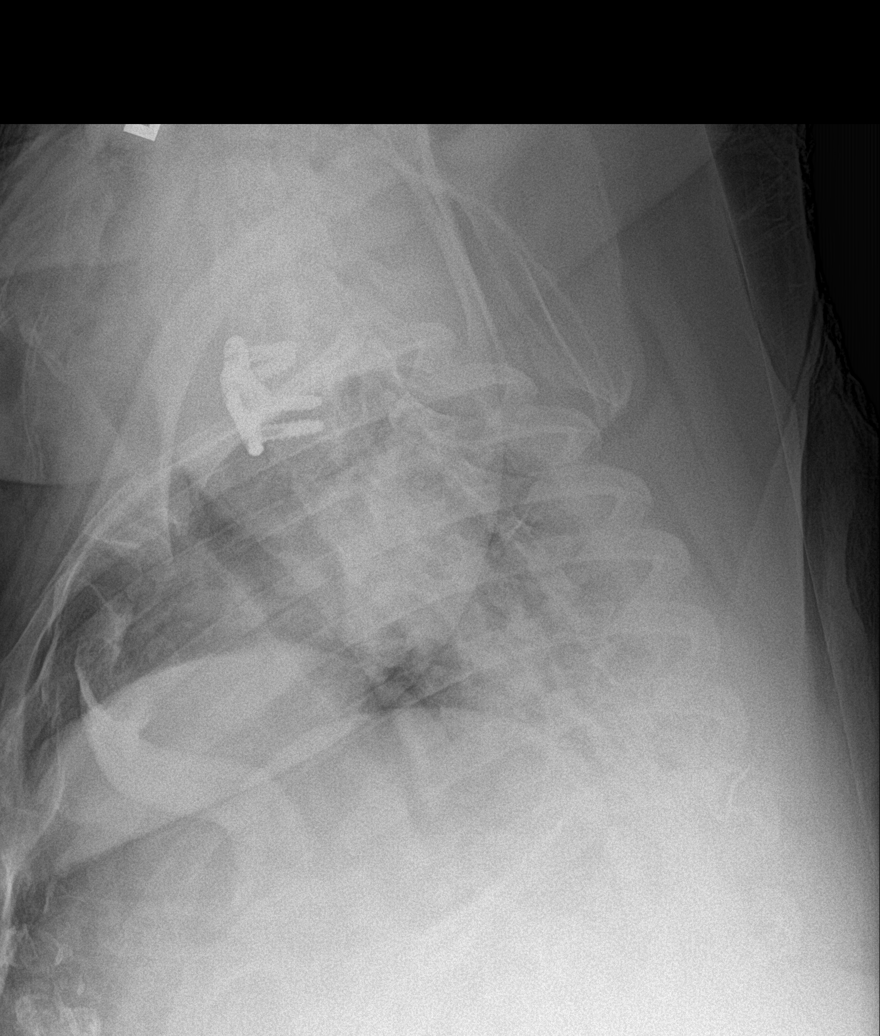

[c-spine lat (3 of 3)]
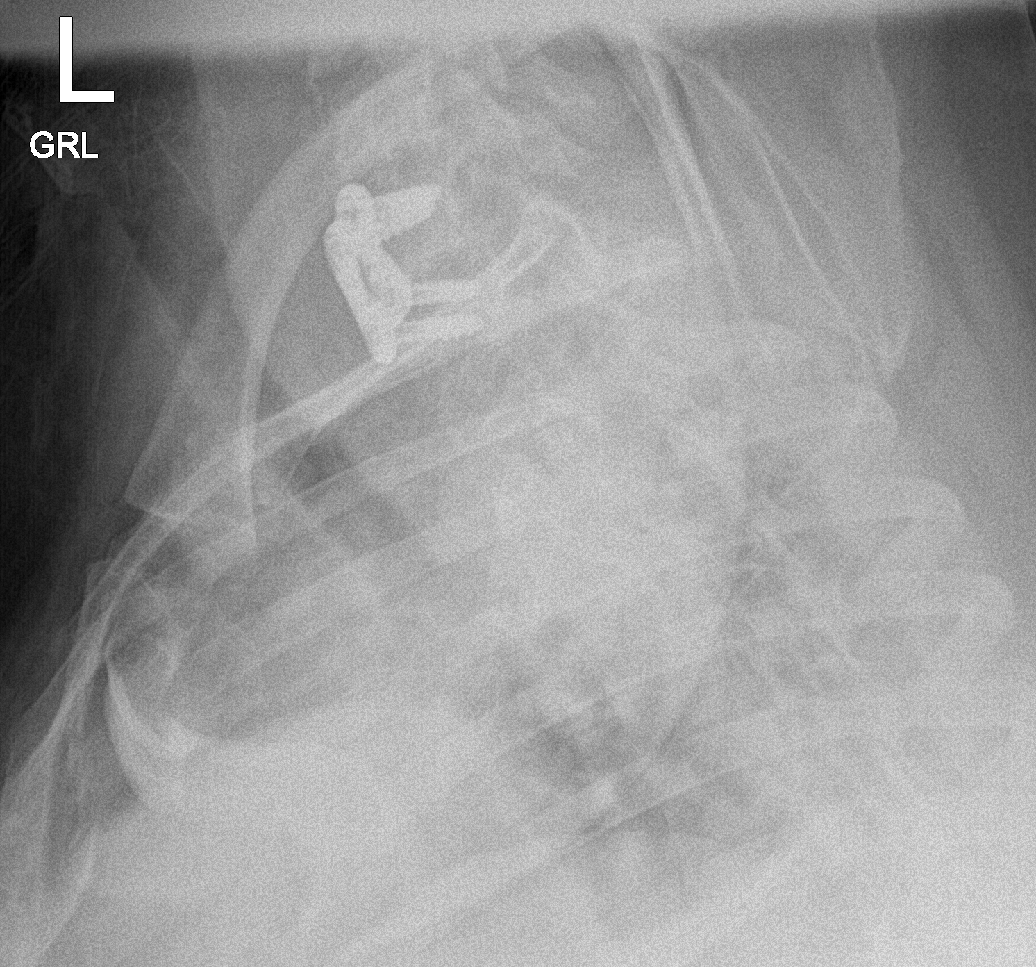

[4 of 4 positions shown; findings below may reference images not displayed]

FINDINGS: Frontal and lateral images were obtained. The patient is status post
anterior screw and plate fixation at C6 and C7. Support hardware
appears intact. No fracture or spondylolisthesis. Disc spaces appear
unremarkable. There is mild calcification in the anterior
longitudinal ligament at C4-5.
IMPRESSION: Postoperative change at C6 and C7 with support hardware intact. No
appreciable disc space narrowing. No fracture or spondylolisthesis.

## 2018-01-03 LAB — HM DIABETES EYE EXAM

## 2018-02-03 ENCOUNTER — Other Ambulatory Visit: Payer: Self-pay | Admitting: Family Medicine

## 2018-02-23 ENCOUNTER — Ambulatory Visit: Payer: Managed Care, Other (non HMO) | Admitting: Physician Assistant

## 2018-02-23 ENCOUNTER — Encounter: Payer: Self-pay | Admitting: Physician Assistant

## 2018-02-23 VITALS — BP 126/78 | HR 84 | Temp 98.2°F | Resp 16 | Wt 225.0 lb

## 2018-02-23 DIAGNOSIS — M79605 Pain in left leg: Secondary | ICD-10-CM | POA: Diagnosis not present

## 2018-02-23 MED ORDER — MELOXICAM 7.5 MG PO TABS: 7.5000 mg | ORAL_TABLET | Freq: Every day | ORAL | 0 refills | Status: DC

## 2018-02-23 NOTE — Progress Notes (Signed)
Patient: Kelly Matthews Female    DOB: 09/25/1966   52 y.o.   MRN: [id number] Visit Date: 02/24/2018  Today's Provider: Trinna Post, PA-C   Chief Complaint  Patient presents with  . Leg Pain    Happened yesterday.   Subjective:    Kelly Matthews is a 52 y/u woman presenting today for left leg pain. She says she works at [employer] yesterday and she said she was walking and experienced a pain in her left leg followed by swelling. She was able to bear weight but is limping. She did not fall or otherwise injure herself.   Leg Pain   The incident occurred 12 to 24 hours ago. The incident occurred at work. The pain is present in the left leg. The quality of the pain is described as aching. Pertinent negatives include no inability to bear weight, loss of motion, loss of sensation, muscle weakness, numbness or tingling. The symptoms are aggravated by palpation and weight bearing. She has tried ice and NSAIDs for the symptoms. The treatment provided mild relief.       Allergies  Allergen Reactions  . Oxycodone Itching     Current Outpatient Medications:  .  clobetasol (TEMOVATE) 0.05 % external solution, APPLY TWICE DAILY TO SCALP FOR ITCH, Disp: , Rfl: 4 .  Clobetasol Propionate 0.05 % shampoo, SHAMPOO DAILY. LEAVE IN PLACE FOR 30 MINUTES AND RINSE, Disp: , Rfl: 3 .  Dapagliflozin-Metformin HCl ER 03-999 MG TB24, Take by mouth., Disp: , Rfl:  .  diltiazem (DILACOR XR) 120 MG 24 hr capsule, Take 120 mg by mouth every morning. , Disp: , Rfl:  .  fluticasone (FLONASE) 50 MCG/ACT nasal spray, SPRAY 2 SPRAYS IN EACH NOSTRIL DAILY (Patient taking differently: Place 1-2 sprays into both nostrils daily as needed for allergies. ), Disp: 16 g, Rfl: 5 .  glucose blood (ACCU-CHEK ACTIVE STRIPS) test strip, Use as instructed, Disp: 300 each, Rfl: 3 .  Lancets (ACCU-CHEK SOFT TOUCH) lancets, Use as instructed, Disp: 300 each, Rfl: 3 .  montelukast (SINGULAIR) 10 MG tablet,  TAKE 1 TABLET BY MOUTH EVERY DAY, Disp: 30 tablet, Rfl: 5 .  omeprazole (PRILOSEC) 40 MG capsule, Take 1 capsule (40 mg total) by mouth daily., Disp: 30 capsule, Rfl: 0 .  sitaGLIPtin (JANUVIA) 100 MG tablet, Take by mouth., Disp: , Rfl:  .  telmisartan (MICARDIS) 40 MG tablet, Take by mouth., Disp: , Rfl:  .  amitriptyline (ELAVIL) 10 MG tablet, TAKE 1 TABLET BY MOUTH 3 TIMES A DAY (Patient not taking: Reported on 02/23/2018), Disp: 270 tablet, Rfl: 0 .  amoxicillin (AMOXIL) 875 MG tablet, Take 1 tablet (875 mg total) 2 (two) times daily by mouth., Disp: 20 tablet, Rfl: 0 .  B-D INS SYR ULTRAFINE 1CC/31G 31G X 5/16" 1 ML MISC, USE ONE SYRINGE FOR EACH DOSAGE (NOVOLOG THREE TIMES A DAY AND LANTUS ONCE A DAY), Disp: , Rfl: 3 .  meloxicam (MOBIC) 7.5 MG tablet, Take 1 tablet (7.5 mg total) by mouth daily., Disp: 30 tablet, Rfl: 0 .  methocarbamol (ROBAXIN) 500 MG tablet, Take 1 tablet (500 mg total) by mouth 3 (three) times daily. (Patient not taking: Reported on 02/23/2018), Disp: 60 tablet, Rfl: 0  Review of Systems  Constitutional: Negative.   Musculoskeletal: Positive for gait problem and myalgias. Negative for arthralgias, back pain, joint swelling, neck pain and neck stiffness.  Neurological: Negative for tingling, weakness and numbness.    Social History  Tobacco Use  . Smoking status: Former Smoker    Years: 1.00    Last attempt to quit: 01/19/1985    Years since quitting: 33.1  . Smokeless tobacco: Never Used  Substance Use Topics  . Alcohol use: No    Alcohol/week: 0.0 oz    Comment: occasionally 1 beer every 6 months   Objective:   BP 126/78 (BP Location: Right Arm, Patient Position: Sitting, Cuff Size: Large)   Pulse 84   Temp 98.2 F (36.8 C) (Oral)   Resp 16   Wt 225 lb (102.1 kg)   BMI 45.44 kg/m  Vitals:   02/23/18 1454  BP: 126/78  Pulse: 84  Resp: 16  Temp: 98.2 F (36.8 C)  TempSrc: Oral  Weight: 225 lb (102.1 kg)     Physical Exam  Constitutional: She  is oriented to person, place, and time. She appears well-developed and well-nourished.  Musculoskeletal: She exhibits edema and tenderness.  Some mild edema of left calf. No bruising. Tender to palpation. Negative thompson's test.   Neurological: She is alert and oriented to person, place, and time.  Reflex Scores:      Patellar reflexes are 2+ on the right side and 2+ on the left side.      Achilles reflexes are 2+ on the right side and 2+ on the left side. Skin: Skin is warm and dry.  Psychiatric: She has a normal mood and affect. Her behavior is normal.        Assessment & Plan:     1. Left leg pain  Exam not concerning for tendon rupture. Possibly microtear, will treat conservatively with rest and anti-inflammatories.   - meloxicam (MOBIC) 7.5 MG tablet; Take 1 tablet (7.5 mg total) by mouth daily.  Dispense: 30 tablet; Refill: 0  Return if symptoms worsen or fail to improve.   The entirety of the information documented in the History of Present Illness, Review of Systems and Physical Exam were personally obtained by me. Portions of this information were initially documented by Ashley Royalty, CMA and reviewed by me for thoroughness and accuracy.        Trinna Post, PA-C  [facility]

## 2018-02-24 DIAGNOSIS — M79604 Pain in right leg: Secondary | ICD-10-CM | POA: Insufficient documentation

## 2018-03-22 ENCOUNTER — Other Ambulatory Visit: Payer: Self-pay | Admitting: Physician Assistant

## 2018-03-22 DIAGNOSIS — M79605 Pain in left leg: Secondary | ICD-10-CM

## 2018-03-22 NOTE — Telephone Encounter (Signed)
Will refill Meloxicam but should schedule follow up if pain in leg not improving over the past 3 weeks. May need to check for any DVT also, if it continues to swell.

## 2018-04-12 ENCOUNTER — Other Ambulatory Visit: Payer: Self-pay | Admitting: Family Medicine

## 2018-04-12 DIAGNOSIS — Z1231 Encounter for screening mammogram for malignant neoplasm of breast: Secondary | ICD-10-CM

## 2018-04-18 ENCOUNTER — Other Ambulatory Visit: Payer: Self-pay | Admitting: Family Medicine

## 2018-04-18 DIAGNOSIS — M79605 Pain in left leg: Secondary | ICD-10-CM

## 2018-05-07 ENCOUNTER — Other Ambulatory Visit: Payer: Self-pay | Admitting: Family Medicine

## 2018-05-07 DIAGNOSIS — J4520 Mild intermittent asthma, uncomplicated: Secondary | ICD-10-CM

## 2018-05-09 ENCOUNTER — Encounter: Payer: Self-pay | Admitting: Emergency Medicine

## 2018-05-09 ENCOUNTER — Other Ambulatory Visit: Payer: Self-pay

## 2018-05-09 DIAGNOSIS — I10 Essential (primary) hypertension: Secondary | ICD-10-CM | POA: Diagnosis not present

## 2018-05-09 DIAGNOSIS — Z7984 Long term (current) use of oral hypoglycemic drugs: Secondary | ICD-10-CM | POA: Insufficient documentation

## 2018-05-09 DIAGNOSIS — K2970 Gastritis, unspecified, without bleeding: Secondary | ICD-10-CM | POA: Diagnosis not present

## 2018-05-09 DIAGNOSIS — Z79899 Other long term (current) drug therapy: Secondary | ICD-10-CM | POA: Diagnosis not present

## 2018-05-09 DIAGNOSIS — E119 Type 2 diabetes mellitus without complications: Secondary | ICD-10-CM | POA: Diagnosis not present

## 2018-05-09 DIAGNOSIS — Z87891 Personal history of nicotine dependence: Secondary | ICD-10-CM | POA: Insufficient documentation

## 2018-05-09 DIAGNOSIS — R1013 Epigastric pain: Secondary | ICD-10-CM | POA: Diagnosis not present

## 2018-05-09 LAB — TROPONIN I: Troponin I: <0.03 ng/mL (ref <0.03)

## 2018-05-09 LAB — URINALYSIS, COMPLETE (UACMP) WITH MICROSCOPIC
Bacteria, UA: NONE SEEN
Bilirubin Urine: NEGATIVE
Ketones, ur: 5 mg/dL — AB
Leukocytes, UA: NEGATIVE
NITRITE: NEGATIVE
Protein, ur: NEGATIVE mg/dL
SPECIFIC GRAVITY, URINE: 1.031 — AB (ref 1.005–1.030)
pH: 6 (ref 5.0–8.0)

## 2018-05-09 LAB — COMPREHENSIVE METABOLIC PANEL
ALBUMIN: 4 g/dL (ref 3.5–5.0)
ALK PHOS: 83 U/L (ref 38–126)
ALT: 91 U/L — AB (ref 14–54)
ANION GAP: 11 (ref 5–15)
AST: 56 U/L — AB (ref 15–41)
BILIRUBIN TOTAL: 0.4 mg/dL (ref 0.3–1.2)
BUN: 15 mg/dL (ref 6–20)
CALCIUM: 9.1 mg/dL (ref 8.9–10.3)
CO2: 21 mmol/L — ABNORMAL LOW (ref 22–32)
CREATININE: 0.54 mg/dL (ref 0.44–1.00)
Chloride: 104 mmol/L (ref 101–111)
GFR calc Af Amer: >60 mL/min (ref >60)
GFR calc non Af Amer: >60 mL/min (ref >60)
Glucose, Bld: 149 mg/dL — ABNORMAL HIGH (ref 65–99)
Potassium: 3.7 mmol/L (ref 3.5–5.1)
Sodium: 136 mmol/L (ref 135–145)
TOTAL PROTEIN: 7.2 g/dL (ref 6.5–8.1)

## 2018-05-09 LAB — LIPASE, BLOOD: Lipase: 34 U/L (ref 11–51)

## 2018-05-09 LAB — POCT PREGNANCY, URINE: Preg Test, Ur: NEGATIVE

## 2018-05-09 LAB — CBC
HCT: 43.6 % (ref 35.0–47.0)
Hemoglobin: 14.7 g/dL (ref 12.0–16.0)
MCH: 28.3 pg (ref 26.0–34.0)
MCHC: 33.7 g/dL (ref 32.0–36.0)
MCV: 84.1 fL (ref 80.0–100.0)
PLATELETS: 176 10*3/uL (ref 150–440)
RBC: 5.18 MIL/uL (ref 3.80–5.20)
RDW: 13.7 % (ref 11.5–14.5)
WBC: 12.1 10*3/uL — ABNORMAL HIGH (ref 3.6–11.0)

## 2018-05-09 NOTE — ED Triage Notes (Addendum)
Pt presents to ED with sudden onset of epigastric pain for the past hour. Denies vomiting. +nausea. Denies similar pain previously. States she was lying in bed at onset of pain. Appears anxious.

## 2018-05-10 ENCOUNTER — Ambulatory Visit: Payer: Managed Care, Other (non HMO) | Admitting: Family Medicine

## 2018-05-10 ENCOUNTER — Encounter: Payer: Self-pay | Admitting: Family Medicine

## 2018-05-10 ENCOUNTER — Telehealth: Payer: Self-pay | Admitting: Family Medicine

## 2018-05-10 ENCOUNTER — Emergency Department
Admission: EM | Admit: 2018-05-10 | Discharge: 2018-05-10 | Disposition: A | Discharge status: Home / Self Care | Payer: Managed Care, Other (non HMO) | Attending: Emergency Medicine | Admitting: Emergency Medicine

## 2018-05-10 VITALS — BP 118/70 | HR 93 | Temp 97.7°F | Wt 225.8 lb

## 2018-05-10 DIAGNOSIS — K2970 Gastritis, unspecified, without bleeding: Secondary | ICD-10-CM

## 2018-05-10 DIAGNOSIS — R1013 Epigastric pain: Secondary | ICD-10-CM

## 2018-05-10 MED ORDER — FAMOTIDINE 20 MG PO TABS: 20.0000 mg | ORAL_TABLET | Freq: Two times a day (BID) | ORAL | 0 refills | Status: DC

## 2018-05-10 MED ORDER — HYOSCYAMINE SULFATE 0.125 MG PO TABS: 0.1250 mg | ORAL_TABLET | ORAL | 0 refills | Status: DC | PRN

## 2018-05-10 MED ORDER — FAMOTIDINE 20 MG PO TABS
40.0000 mg | ORAL_TABLET | Freq: Once | ORAL | Status: AC
Start: 2018-05-10 — End: 2018-05-10
  Administered 2018-05-10: 40 mg via ORAL
  Filled 2018-05-10: qty 2

## 2018-05-10 MED ORDER — ALUMINUM-MAGNESIUM-SIMETHICONE 200-200-20 MG/5ML PO SUSP: 30.0000 mL | Freq: Three times a day (TID) | ORAL | 0 refills | Status: DC

## 2018-05-10 MED ORDER — GI COCKTAIL ~~LOC~~
30.0000 mL | ORAL | Status: AC
  Administered 2018-05-10: 30 mL via ORAL
  Filled 2018-05-10: qty 30

## 2018-05-10 NOTE — ED Provider Notes (Signed)
[facility] Emergency Department Provider Note  ____________________________________________  Time seen: Approximately 12:55 AM  I have reviewed the triage vital signs and the nursing notes.   HISTORY  Chief Complaint Abdominal Pain    HPI Kelly Matthews is a 52 y.o. female with a history of diabetes hypertension and idiopathic tachycardia who complains of epigastric pain, sudden onset, moderate intensity, gradually improving, nonradiating, constant, ongoing for the past 4 hours.  No recent difficulty with eating or food related symptoms.  Not exertional, not pleuritic.  Occurred while lying in her bed looking at her smart phone.  Never had anything like this before.  Denies fevers chills or recent illness.  No aggravating or alleviating factors.  Currently mild intensity      Past Medical History:  Diagnosis Date  . 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 Diabetes mellitus without complication ([place])   . Hypertension   . Psoriasis (a type of skin inflammation)   . Tachycardia      Patient Active Problem List   Diagnosis Date Noted  . Right leg pain 02/24/2018  . Women's annual routine gynecological examination 05/14/2017  . Malnutrition of moderate degree 12/07/2016  . DKA (diabetic ketoacidoses) ([place]) 12/05/2016  . Migraine headache 05/05/2016  . Allergic rhinitis 07/23/2015  . Excess, menstruation 07/19/2015  . 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 Blood glucose elevated 07/19/2015  . Airway hyperreactivity 07/19/2015  . Disorder of esophagus 07/19/2015  . Hypercholesterolemia without hypertriglyceridemia 07/19/2015  . Borderline diabetes 07/19/2015  . Diabetes ([place]) 11/28/2014  . Essential (primary) hypertension 11/28/2014  . Aortic heart valve narrowing 11/28/2014  . 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 Supraventricular tachycardia ([place]) 11/28/2014  . 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 ANTERIOR CERVICAL DECOMP/DISCECTOMY FUSION N/A 02/01/2017   Procedure: ANTERIOR CERVICAL DECOMPRESSION/DISCECTOMY FUSION 1 LEVEL;  Surgeon: Bayard Hugger, MD;  Location: [facility] ORS;  Service: Neurosurgery;  Laterality: N/A;  . BREAST BIOPSY Left 04/18/2015   Benign calcification  . CESAREAN SECTION  1990,2001  . INTRAUTERINE DEVICE INSERTION  October 2015   [facility]     Prior to Admission medications   Medication Sig Start Date End Date Taking? Authorizing Provider  aluminum-magnesium hydroxide-simethicone (MAALOX) 268-341-96 MG/5ML SUSP Take 30 mLs by mouth 4 (four) times daily -  before meals and at bedtime. 05/10/18   Carrie Mew, MD  amitriptyline (ELAVIL) 10 MG tablet TAKE 1 TABLET BY MOUTH 3 TIMES A DAY Patient not taking: Reported on 02/23/2018 02/03/18   Chrismon, Vickki Muff, PA  amoxicillin (AMOXIL) 875 MG tablet Take 1 tablet (875 mg total) 2 (two) times daily by mouth. 09/20/17   Chrismon, Vickki Muff, PA  B-D INS SYR ULTRAFINE 1CC/31G 31G X 5/16" 1 ML MISC USE ONE SYRINGE FOR EACH DOSAGE (NOVOLOG THREE TIMES A DAY AND LANTUS ONCE A DAY) 12/10/16   [provider]  clobetasol (TEMOVATE) 0.05 % external solution APPLY TWICE DAILY TO SCALP FOR [facility] 02/24/17   [provider]  Clobetasol Propionate 0.05 % shampoo SHAMPOO DAILY. LEAVE IN PLACE FOR 30 MINUTES AND RINSE 02/24/17   [provider]  Dapagliflozin-Metformin HCl ER 03-999 MG TB24 Take by mouth. 02/22/17   [provider]  diltiazem (DILACOR XR) 120 MG 24 hr capsule Take 120 mg by mouth every morning.  12/03/14   [provider]  famotidine (PEPCID) 20 MG tablet Take 1 tablet (20  mg total) by mouth 2 (two) times daily. 05/10/18   Carrie Mew, MD  fluticasone (FLONASE) 50 MCG/ACT nasal spray SPRAY 2 SPRAYS IN EACH NOSTRIL DAILY Patient taking differently: Place 1-2 sprays into both nostrils daily as needed for allergies.   07/26/15   Chrismon, Vickki Muff, PA  glucose blood (ACCU-CHEK ACTIVE STRIPS) test strip Use as instructed 11/02/17   Chrismon, Vickki Muff, PA  Lancets (ACCU-CHEK SOFT TOUCH) lancets Use as instructed 11/02/17   Chrismon, Vickki Muff, PA  meloxicam (MOBIC) 7.5 MG tablet TAKE 1 TABLET BY MOUTH EVERY DAY 04/18/18   Chrismon, Vickki Muff, PA  methocarbamol (ROBAXIN) 500 MG tablet Take 1 tablet (500 mg total) by mouth 3 (three) times daily. Patient not taking: Reported on 02/23/2018 02/01/17   Edison Pace, PA-C  montelukast (SINGULAIR) 10 MG tablet TAKE 1 TABLET BY MOUTH EVERY DAY 05/09/18   Chrismon, Vickki Muff, PA  omeprazole (PRILOSEC) 40 MG capsule Take 1 capsule (40 mg total) by mouth daily. 02/01/17   Drinkwater, Fleeta Emmer, PA-C  sitaGLIPtin (JANUVIA) 100 MG tablet Take by mouth. 06/01/17 06/01/18  [provider]  telmisartan (MICARDIS) 40 MG tablet Take by mouth. 07/06/17 07/06/18  [provider]     Allergies Oxycodone   Family History  Problem Relation Age of Onset  . Coronary artery disease Mother   . Coronary artery disease Father   . Diabetes Father   . Emphysema Father   . Seizures Daughter   . Cancer Maternal Grandmother   . Breast cancer Neg Hx     Social History Social History   Tobacco Use  . Smoking status: Former Smoker    Years: 1.00    Last attempt to quit: 01/19/1985    Years since quitting: 33.3  . Smokeless tobacco: Never Used  Substance Use Topics  . Alcohol use: No    Alcohol/week: 0.0 oz    Comment: occasionally 1 beer every 6 months  . Drug use: No    Review of Systems  Constitutional:   No fever or chills.  ENT:   No sore throat. No rhinorrhea. Cardiovascular:   No chest pain or syncope. Respiratory:   No dyspnea or cough. Gastrointestinal: Positive as above for upper abdominal pain without vomiting and diarrhea.  Musculoskeletal:   Negative for focal pain or swelling All other systems reviewed and are negative except as documented above in ROS  and HPI.  ____________________________________________   PHYSICAL EXAM:  VITAL SIGNS: ED Triage Vitals [05/09/18 2102]  Enc Vitals Group     BP (!) 146/91     Pulse Rate 96     Resp 20     Temp 97.8 F (36.6 C)     Temp Source Oral     SpO2 99 %     Weight 224 lb (101.6 kg)     Height 4\' 11"  (1.499 m)     Head Circumference      Peak Flow      Pain Score 8     Pain Loc      Pain Edu?      Excl. in [place]?     Vital signs reviewed, nursing assessments reviewed.   Constitutional:   Alert and oriented. Non-toxic appearance. Eyes:   Conjunctivae are normal. EOMI. PERRL. ENT      Head:   Normocephalic and atraumatic.      Nose:   No congestion/rhinnorhea.       Mouth/Throat:   MMM, no pharyngeal erythema. No  peritonsillar mass.       Neck:   No meningismus. Full ROM. Hematological/Lymphatic/Immunilogical:   No cervical lymphadenopathy. Cardiovascular:   RRR. Symmetric bilateral radial and DP pulses.  No murmurs.  Respiratory:   Normal respiratory effort without tachypnea/retractions. Breath sounds are clear and equal bilaterally. No wheezes/rales/rhonchi. Gastrointestinal:   Soft with mild left upper quadrant tenderness reproducing her symptoms. Non distended. There is no CVA tenderness.  No rebound, rigidity, or guarding.  Musculoskeletal:   Normal range of motion in all extremities. No joint effusions.  No lower extremity tenderness.  No edema. Neurologic:   Normal speech and language.  Motor grossly intact. No acute focal neurologic deficits are appreciated.  Skin:    Skin is warm, dry and intact. No rash noted.  No petechiae, purpura, or bullae.  ____________________________________________    LABS (pertinent positives/negatives) (all labs ordered are listed, but only abnormal results are displayed) Labs Reviewed  COMPREHENSIVE METABOLIC PANEL - Abnormal; Notable for the following components:      Result Value   CO2 21 (*)    Glucose, Bld 149 (*)    AST 56 (*)     ALT 91 (*)    All other components within normal limits  CBC - Abnormal; Notable for the following components:   WBC 12.1 (*)    All other components within normal limits  URINALYSIS, COMPLETE (UACMP) WITH MICROSCOPIC - Abnormal; Notable for the following components:   Color, Urine YELLOW (*)    APPearance CLEAR (*)    Specific Gravity, Urine 1.031 (*)    Glucose, UA >=500 (*)    Hgb urine dipstick SMALL (*)    Ketones, ur 5 (*)    All other components within normal limits  LIPASE, BLOOD  TROPONIN I  POC URINE PREG, ED  POCT PREGNANCY, URINE   ____________________________________________   EKG  Interpreted by me Normal sinus rhythm rate of 99, normal axis and intervals.  Normal QRS ST segments and T waves.  ____________________________________________    RADIOLOGY  No results found.  ____________________________________________   PROCEDURES Procedures  ____________________________________________  DIFFERENTIAL DIAGNOSIS   GERD, gastritis, gas pain.  Low suspicion for bowel obstruction, perforation, pancreatitis, intra-abdominal abscess, AAA, dissection, pneumothorax, pneumonia, ACS, PE, carditis.  CLINICAL IMPRESSION / ASSESSMENT AND PLAN / ED COURSE  Pertinent labs & imaging results that were available during my care of the patient were reviewed by me and considered in my medical decision making (see chart for details).    Patient well-appearing and nontoxic.  EKG is unremarkable.  Labs are unremarkable.  Symptoms and exam are suggestive of gastritis.  Trial of acid suppression therapy, follow-up with primary care.      ____________________________________________   FINAL CLINICAL IMPRESSION(S) / ED DIAGNOSES    Final diagnoses:  Epigastric pain  Gastritis without bleeding, unspecified chronicity, unspecified gastritis type     ED Discharge Orders        Ordered    aluminum-magnesium hydroxide-simethicone (MAALOX) 200-200-20 MG/5ML SUSP  3  times daily before meals & bedtime     05/10/18 0054    famotidine (PEPCID) 20 MG tablet  2 times daily     05/10/18 0054      Portions of this note were generated with dragon dictation software. Dictation errors may occur despite best attempts at proofreading.    Carrie Mew, MD 05/10/18 [id number]

## 2018-05-10 NOTE — Addendum Note (Signed)
Addended by: [name] E on: 05/10/2018 04:45 PM   Modules accepted: Orders

## 2018-05-10 NOTE — Progress Notes (Signed)
Patient: Kelly Matthews Female    DOB: 06/09/1966   52 y.o.   MRN: [id number] Visit Date: 05/10/2018  Today's Provider: [name], PA   Chief Complaint  Patient presents with  . Abdominal Pain   Subjective:    Abdominal Pain  This is a new problem. The current episode started yesterday. The onset quality is sudden. The problem occurs intermittently. The problem has been waxing and waning. Pain location: upper abdomen. The quality of the pain is dull and aching. The abdominal pain radiates to the chest and back. Associated symptoms include nausea. Nothing aggravates the pain. Relieved by: GI cocktail. Treatments tried: GI cocktail, Maalox and Pepcid. Prior workup: EKG and labs at ER on 05/09/18.      Patient Active Problem List   Diagnosis Date Noted  . Right leg pain 02/24/2018  . Women's annual routine gynecological examination 05/14/2017  . Malnutrition of moderate degree 12/07/2016  . DKA (diabetic ketoacidoses) ([place]) 12/05/2016  . Migraine headache 05/05/2016  . Allergic rhinitis 07/23/2015  . Excess, menstruation 07/19/2015  . 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 Blood glucose elevated 07/19/2015  . Airway hyperreactivity 07/19/2015  . Disorder of esophagus 07/19/2015  . Hypercholesterolemia without hypertriglyceridemia 07/19/2015  . Borderline diabetes 07/19/2015  . Diabetes ([place]) 11/28/2014  . Essential (primary) hypertension 11/28/2014  . Aortic heart valve narrowing 11/28/2014  . 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 Supraventricular tachycardia ([place]) 11/28/2014  . 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 ANTERIOR CERVICAL DECOMP/DISCECTOMY FUSION N/A 02/01/2017   Procedure: ANTERIOR CERVICAL DECOMPRESSION/DISCECTOMY FUSION 1 LEVEL;  Surgeon: Bayard Hugger, MD;  Location: [facility] ORS;  Service: Neurosurgery;  Laterality: N/A;  . BREAST BIOPSY Left 04/18/2015   Benign calcification  . CESAREAN SECTION  1990,2001  . INTRAUTERINE DEVICE INSERTION  October 2015   [facility]   Family History  Problem Relation Age of Onset  . Coronary artery disease Mother   . Coronary artery disease Father   . Diabetes Father   . Emphysema Father   . Seizures Daughter   . Cancer Maternal Grandmother   . Breast cancer Neg Hx    Allergies  Allergen Reactions  . Oxycodone Itching    Current Outpatient Medications:  .  aluminum-magnesium hydroxide-simethicone (MAALOX) 921-194-17 MG/5ML SUSP, Take 30 mLs by mouth 4 (four) times daily -  before meals and at bedtime., Disp: 355 mL, Rfl: 0 .  amitriptyline (ELAVIL) 10 MG tablet, TAKE 1 TABLET BY MOUTH 3 TIMES A DAY (Patient not taking: Reported on 02/23/2018), Disp: 270 tablet, Rfl: 0 .  B-D INS SYR ULTRAFINE 1CC/31G 31G X 5/16" 1 ML MISC, USE ONE SYRINGE FOR EACH DOSAGE (NOVOLOG THREE TIMES A DAY AND LANTUS ONCE A DAY), Disp: , Rfl: 3 .  clobetasol (TEMOVATE) 0.05 % external solution, APPLY TWICE DAILY TO SCALP FOR ITCH, Disp: , Rfl: 4 .  Clobetasol Propionate 0.05 % shampoo, SHAMPOO DAILY. LEAVE IN PLACE FOR 30 MINUTES AND RINSE, Disp: , Rfl: 3 .  Dapagliflozin-Metformin HCl ER 03-999 MG TB24, Take by mouth., Disp: , Rfl:  .  diltiazem (DILACOR XR) 120 MG 24 hr capsule, Take 120 mg by mouth every morning. , Disp: , Rfl:  .  famotidine (PEPCID) 20 MG tablet, Take 1 tablet (20 mg total) by mouth 2 (two) times daily., Disp: 60 tablet, Rfl: 0 .  fluticasone (FLONASE) 50 MCG/ACT nasal spray, SPRAY  2 SPRAYS IN EACH NOSTRIL DAILY (Patient taking differently: Place 1-2 sprays into both nostrils daily as needed for allergies. ), Disp: 16 g, Rfl: 5 .  glucose blood (ACCU-CHEK ACTIVE STRIPS) test strip, Use as instructed, Disp: 300 each, Rfl: 3 .  Lancets (ACCU-CHEK SOFT TOUCH) lancets, Use as instructed, Disp: 300 each, Rfl: 3 .  meloxicam (MOBIC) 7.5 MG tablet, TAKE 1 TABLET BY MOUTH EVERY DAY, Disp: 30 tablet, Rfl: 0 .   methocarbamol (ROBAXIN) 500 MG tablet, Take 1 tablet (500 mg total) by mouth 3 (three) times daily. (Patient not taking: Reported on 02/23/2018), Disp: 60 tablet, Rfl: 0 .  montelukast (SINGULAIR) 10 MG tablet, TAKE 1 TABLET BY MOUTH EVERY DAY, Disp: 90 tablet, Rfl: 1 .  omeprazole (PRILOSEC) 40 MG capsule, Take 1 capsule (40 mg total) by mouth daily., Disp: 30 capsule, Rfl: 0 .  sitaGLIPtin (JANUVIA) 100 MG tablet, Take by mouth., Disp: , Rfl:  .  telmisartan (MICARDIS) 40 MG tablet, Take by mouth., Disp: , Rfl:   Review of Systems  Constitutional: Negative.   Respiratory: Negative.   Cardiovascular: Negative.   Gastrointestinal: Positive for abdominal pain and nausea.   Social History   Tobacco Use  . Smoking status: Former Smoker    Years: 1.00    Last attempt to quit: 01/19/1985    Years since quitting: 33.3  . Smokeless tobacco: Never Used  Substance Use Topics  . Alcohol use: No    Alcohol/week: 0.0 oz    Comment: occasionally 1 beer every 6 months   Objective:   BP 118/70 (BP Location: Right Arm, Patient Position: Sitting, Cuff Size: Large)   Pulse 93   Temp 97.7 F (36.5 C) (Oral)   Wt 225 lb 12.8 oz (102.4 kg)   SpO2 99%   BMI 45.61 kg/m   Physical Exam  Constitutional: She is oriented to person, place, and time. She appears well-developed and well-nourished. No distress.  HENT:  Head: Normocephalic and atraumatic.  Right Ear: Hearing normal.  Left Ear: Hearing normal.  Nose: Nose normal.  Eyes: Conjunctivae and lids are normal. Right eye exhibits no discharge. Left eye exhibits no discharge. No scleral icterus.  Cardiovascular: Normal rate and regular rhythm.  Pulmonary/Chest: Effort normal and breath sounds normal. No respiratory distress.  Abdominal: Soft. Bowel sounds are normal. She exhibits no mass. There is tenderness. There is rebound.  Tenderness and colicky pain in the upper abdomen/epigastrium. No masses felt. Some rebound to the epigastrium.    Musculoskeletal: Normal range of motion.  Neurological: She is alert and oriented to person, place, and time.  Skin: Skin is intact. No lesion and no rash noted.  Psychiatric: She has a normal mood and affect. Her speech is normal and behavior is normal. Thought content normal.      Assessment & Plan:     1. Epigastric pain Onset yesterday with intermittent increase in intensity/colicky character of epigastric and upper abdomen pain. No vomiting or diarrhea. Has noticed discomfort occurring after meals. No significant gas issues. Some heart burn yesterday that was relieved with GI cocktail. Will get stat abdominal ultrasound to rule out GB disease or renal stone versus pancreatitis (normal lipase yesterday). Recommend PPI and bland no fat or dairy products. May need Levsin-SL prn spasms. Recent Results (from the past 2160 hour(s))  Lipase, blood     Status: None   Collection Time: 05/09/18  9:07 PM  Result Value Ref Range   Lipase 34 11 - 51 U/L  Comment: Performed at [facility], [address] [place], [place] [address]  Comprehensive metabolic panel     Status: Abnormal   Collection Time: 05/09/18  9:07 PM  Result Value Ref Range   Sodium 136 135 - 145 mmol/L   Potassium 3.7 3.5 - 5.1 mmol/L   Chloride 104 101 - 111 mmol/L   CO2 21 (L) 22 - 32 mmol/L   Glucose, Bld 149 (H) 65 - 99 mg/dL   BUN 15 6 - 20 mg/dL   Creatinine, Ser 0.54 0.44 - 1.00 mg/dL   Calcium 9.1 8.9 - 10.3 mg/dL   Total Protein 7.2 6.5 - 8.1 g/dL   Albumin 4.0 3.5 - 5.0 g/dL   AST 56 (H) 15 - 41 U/L   ALT 91 (H) 14 - 54 U/L   Alkaline Phosphatase 83 38 - 126 U/L   Total Bilirubin 0.4 0.3 - 1.2 mg/dL   GFR calc non Af Amer >60 >60 mL/min   GFR calc Af Amer >60 >60 mL/min    Comment: (NOTE) The eGFR has been calculated using the CKD EPI equation. This calculation has not been validated in all clinical situations. eGFR's persistently <60 mL/min signify possible Chronic Kidney Disease.    Anion  gap 11 5 - 15    Comment: Performed at [facility], [address] [place], [place] [address]  CBC     Status: Abnormal   Collection Time: 05/09/18  9:07 PM  Result Value Ref Range   WBC 12.1 (H) 3.6 - 11.0 K/uL   RBC 5.18 3.80 - 5.20 MIL/uL   Hemoglobin 14.7 12.0 - 16.0 g/dL   HCT 43.6 35.0 - 47.0 %   MCV 84.1 80.0 - 100.0 fL   MCH 28.3 26.0 - 34.0 pg   MCHC 33.7 32.0 - 36.0 g/dL   RDW 13.7 11.5 - 14.5 %   Platelets 176 150 - 440 K/uL    Comment: Performed at [facility], [address] [place], [place] [address]  Urinalysis, Complete w Microscopic     Status: Abnormal   Collection Time: 05/09/18  9:07 PM  Result Value Ref Range   Color, Urine YELLOW (A) YELLOW   APPearance CLEAR (A) CLEAR   Specific Gravity, Urine 1.031 (H) 1.005 - 1.030   pH 6.0 5.0 - 8.0   Glucose, UA >=500 (A) NEGATIVE mg/dL   Hgb urine dipstick SMALL (A) NEGATIVE   Bilirubin Urine NEGATIVE NEGATIVE   Ketones, ur 5 (A) NEGATIVE mg/dL   Protein, ur NEGATIVE NEGATIVE mg/dL   Nitrite NEGATIVE NEGATIVE   Leukocytes, UA NEGATIVE NEGATIVE   RBC / HPF 0-5 0 - 5 RBC/hpf   WBC, UA 0-5 0 - 5 WBC/hpf   Bacteria, UA NONE SEEN NONE SEEN   Squamous Epithelial / LPF 0-5 0 - 5   Mucus PRESENT     Comment: Performed at [facility], [address] [place], [place] [address]  Troponin I     Status: None   Collection Time: 05/09/18  9:07 PM  Result Value Ref Range   Troponin I <0.03 <0.03 ng/mL    Comment: Performed at [facility], [address] [place],  [address]  Pregnancy, urine POC     Status: None   Collection Time: 05/09/18  9:11 PM  Result Value Ref Range   Preg Test, Ur NEGATIVE NEGATIVE    Comment:        THE SENSITIVITY OF THIS METHODOLOGY IS >24 mIU/mL     -  US Abdomen Complete       [name], PA  [facility]

## 2018-05-10 NOTE — Telephone Encounter (Signed)
Pt was in the ER last night.  She was diagnosed with a GI infection.. They gave her a cocktail and told her to call her primary. Still having some pain, resting.  Pain is above her stomach under her chest area.  Pt's CB is [phone]  Thanks teri

## 2018-05-10 NOTE — Telephone Encounter (Signed)
Schedule check of abdomen today and possible abdominal ultrasound to rule out GB disease.

## 2018-05-10 NOTE — Telephone Encounter (Signed)
Patient scheduled for 3:00pm today.

## 2018-05-11 ENCOUNTER — Ambulatory Visit
Admission: RE | Admit: 2018-05-11 | Discharge: 2018-05-11 | Disposition: A | Discharge status: Home / Self Care | Payer: Managed Care, Other (non HMO) | Source: Ambulatory Visit | Attending: Family Medicine | Admitting: Family Medicine

## 2018-05-11 ENCOUNTER — Telehealth: Payer: Self-pay | Admitting: *Deleted

## 2018-05-11 DIAGNOSIS — R1013 Epigastric pain: Secondary | ICD-10-CM | POA: Insufficient documentation

## 2018-05-11 DIAGNOSIS — K760 Fatty (change of) liver, not elsewhere classified: Secondary | ICD-10-CM | POA: Diagnosis not present

## 2018-05-11 NOTE — Telephone Encounter (Signed)
Patient was notified of results. Patient states she would like more clarification from Kelly Matthews when he is back in the office. Please advise?

## 2018-05-11 NOTE — Telephone Encounter (Signed)
LMTCB

## 2018-05-11 NOTE — Telephone Encounter (Signed)
Patient called office requesting results for her [place]. Please advise?

## 2018-05-11 NOTE — Telephone Encounter (Signed)
[place] is normal with exception of fatty liver. This is normally painless and is most affected by diet. She can discuss this further with dennis but fatty liver would not be cause of her abdominal pain. No cause of pain seen on [place]. Pancreas visualized portion normal. Gallbladder normal.

## 2018-05-11 NOTE — Telephone Encounter (Signed)
[facility] called to let Kelly Matthews know patient's US abdominal results are in epic.

## 2018-05-12 ENCOUNTER — Encounter: Payer: Self-pay | Admitting: Family Medicine

## 2018-05-12 ENCOUNTER — Other Ambulatory Visit: Payer: Self-pay | Admitting: Family Medicine

## 2018-05-12 DIAGNOSIS — R1013 Epigastric pain: Secondary | ICD-10-CM

## 2018-05-12 NOTE — Telephone Encounter (Signed)
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

## 2018-05-22 ENCOUNTER — Other Ambulatory Visit: Payer: Self-pay | Admitting: Family Medicine

## 2018-05-23 ENCOUNTER — Ambulatory Visit (INDEPENDENT_AMBULATORY_CARE_PROVIDER_SITE_OTHER): Payer: Managed Care, Other (non HMO) | Admitting: Gastroenterology

## 2018-05-23 VITALS — BP 99/66 | HR 121 | Ht 59.0 in | Wt 221.6 lb

## 2018-05-23 DIAGNOSIS — K219 Gastro-esophageal reflux disease without esophagitis: Secondary | ICD-10-CM

## 2018-05-23 DIAGNOSIS — K760 Fatty (change of) liver, not elsewhere classified: Secondary | ICD-10-CM | POA: Diagnosis not present

## 2018-05-23 DIAGNOSIS — R1084 Generalized abdominal pain: Secondary | ICD-10-CM | POA: Diagnosis not present

## 2018-05-23 NOTE — Patient Instructions (Signed)
F/U 3 months Bed wedge  Gastroesophageal Reflux Disease, Adult Normally, food travels down the esophagus and stays in the stomach to be digested. If a person has gastroesophageal reflux disease (GERD), food and stomach acid move back up into the esophagus. When this happens, the esophagus becomes sore and swollen (inflamed). Over time, GERD can make small holes (ulcers) in the lining of the esophagus. Follow these instructions at home: Diet  Follow a diet as told by your doctor. You may need to avoid foods and drinks such as: ? Coffee and tea (with or without caffeine). ? Drinks that contain alcohol. ? Energy drinks and sports drinks. ? Carbonated drinks or sodas. ? Chocolate and cocoa. ? Peppermint and mint flavorings. ? Garlic and onions. ? Horseradish. ? Spicy and acidic foods, such as peppers, chili powder, curry powder, vinegar, hot sauces, and BBQ sauce. ? Citrus fruit juices and citrus fruits, such as oranges, lemons, and limes. ? Tomato-based foods, such as red sauce, chili, salsa, and pizza with red sauce. ? Fried and fatty foods, such as donuts, french fries, potato chips, and high-fat dressings. ? High-fat meats, such as hot dogs, rib eye steak, sausage, ham, and bacon. ? High-fat dairy items, such as whole milk, butter, and cream cheese.  Eat small meals often. Avoid eating large meals.  Avoid drinking large amounts of liquid with your meals.  Avoid eating meals during the 2-3 hours before bedtime.  Avoid lying down right after you eat.  Do not exercise right after you eat. General instructions  Pay attention to any changes in your symptoms.  Take over-the-counter and prescription medicines only as told by your doctor. Do not take aspirin, ibuprofen, or other NSAIDs unless your doctor says it is okay.  Do not use any tobacco products, including cigarettes, chewing tobacco, and e-cigarettes. If you need help quitting, ask your doctor.  Wear loose clothes. Do not  wear anything tight around your waist.  Raise (elevate) the head of your bed about 6 inches (15 cm).  Try to lower your stress. If you need help doing this, ask your doctor.  If you are overweight, lose an amount of weight that is healthy for you. Ask your doctor about a safe weight loss goal.  Keep all follow-up visits as told by your doctor. This is important. Contact a doctor if:  You have new symptoms.  You lose weight and you do not know why it is happening.  You have trouble swallowing, or it hurts to swallow.  You have wheezing or a cough that keeps happening.  Your symptoms do not get better with treatment.  You have a hoarse voice. Get help right away if:  You have pain in your arms, neck, jaw, teeth, or back.  You feel sweaty, dizzy, or light-headed.  You have chest pain or shortness of breath.  You throw up (vomit) and your throw up looks like blood or coffee grounds.  You pass out (faint).  Your poop (stool) is bloody or black.  You cannot swallow, drink, or eat. This information is not intended to replace advice given to you by your health care provider. Make sure you discuss any questions you have with your health care provider. Document Released: 04/20/2008 Document Revised: 04/09/2016 Document Reviewed: 02/27/2015 Elsevier Interactive Patient Education  [employer].

## 2018-05-23 NOTE — Progress Notes (Signed)
Kelly Matthews [address]  [place], [place] [address]  Main: [phone]  Fax: [phone]   Gastroenterology Consultation  Referring Provider:     Margo Common, PA Primary Care Physician:  Margo Common, PA Primary Gastroenterologist:  Dr. Vonda Matthews Reason for Consultation:     Midepigastric pain        HPI:    Chief Complaint  Patient presents with  . Establish Care    Kelly Matthews is a 52 y.o. y/o female referred for consultation & management  by Dr. Natale Milch, Vickki Muff, PA.  Patient reports 2 to 3-week history of midepigastric pain, nonradiating, sharp, intermittent, worsens with meals, improved with PPI and GI cocktail.  No nausea or vomiting, denies heartburn, no dysphagia.  No weight loss.  Reports 2-3 loose problems a day over the last week.  Prior to this has  1-2 formed bowel movements daily.  No blood in stool.  Reports intermittent ibuprofen use.  Uses it about once every 2 weeks.  Reports history of a colonoscopy done about 4 to 6 years ago, states she was having some nausea at the time.  Denies any EGD done at that time.  States she thinks polyps were removed at the time.  No family history of colon cancer.  Past Medical History:  Diagnosis Date  . 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 Diabetes mellitus without complication ([place])   . Hypertension   . Psoriasis (a type of skin inflammation)   . Tachycardia     Past Surgical History:  Procedure Laterality Date  . ANTERIOR CERVICAL DECOMP/DISCECTOMY FUSION N/A 02/01/2017   Procedure: ANTERIOR CERVICAL DECOMPRESSION/DISCECTOMY FUSION 1 LEVEL;  Surgeon: Bayard Hugger, MD;  Location: [facility] ORS;  Service: Neurosurgery;  Laterality: N/A;  . BREAST BIOPSY Left 04/18/2015   Benign calcification  . CESAREAN SECTION  1990,2001  . INTRAUTERINE DEVICE INSERTION  October 2015   [facility]    Prior to Admission medications   Medication Sig Start Date End Date Taking?  Authorizing Provider  aluminum-magnesium hydroxide-simethicone (MAALOX) [phone] MG/5ML SUSP Take 30 mLs by mouth 4 (four) times daily -  before meals and at bedtime. 05/10/18   Carrie Mew, MD  amitriptyline (ELAVIL) 10 MG tablet TAKE 1 TABLET BY MOUTH THREE TIMES A DAY 05/23/18   Chrismon, Vickki Muff, PA  B-D INS SYR ULTRAFINE 1CC/31G 31G X 5/16" 1 ML MISC USE ONE SYRINGE FOR EACH DOSAGE (NOVOLOG THREE TIMES A DAY AND LANTUS ONCE A DAY) 12/10/16   [provider]  clobetasol (TEMOVATE) 0.05 % external solution APPLY TWICE DAILY TO SCALP FOR [facility] 02/24/17   [provider]  Clobetasol Propionate 0.05 % shampoo SHAMPOO DAILY. LEAVE IN PLACE FOR 30 MINUTES AND RINSE 02/24/17   [provider]  Dapagliflozin-Metformin HCl ER 03-999 MG TB24 Take by mouth. 02/22/17   [provider]  diltiazem (DILACOR XR) 120 MG 24 hr capsule Take 120 mg by mouth every morning.  12/03/14   [provider]  famotidine (PEPCID) 20 MG tablet Take 1 tablet (20 mg total) by mouth 2 (two) times daily. 05/10/18   Carrie Mew, MD  fluticasone (FLONASE) 50 MCG/ACT nasal spray SPRAY 2 SPRAYS IN EACH NOSTRIL DAILY Patient taking differently: Place 1-2 sprays into both nostrils daily as needed for allergies.  07/26/15   Chrismon, Vickki Muff, PA  glucose blood (ACCU-CHEK ACTIVE STRIPS) test strip Use as instructed 11/02/17   Chrismon, Vickki Muff, PA  hyoscyamine (  LEVSIN, ANASPAZ) 0.125 MG tablet Take 1 tablet (0.125 mg total) by mouth every 4 (four) hours as needed. 05/10/18   Chrismon, Vickki Muff, PA  Lancets (ACCU-CHEK SOFT TOUCH) lancets Use as instructed 11/02/17   Chrismon, Vickki Muff, PA  meloxicam (MOBIC) 7.5 MG tablet TAKE 1 TABLET BY MOUTH EVERY DAY 04/18/18   Chrismon, Vickki Muff, PA  methocarbamol (ROBAXIN) 500 MG tablet Take 1 tablet (500 mg total) by mouth 3 (three) times daily. Patient not taking: Reported on 02/23/2018 02/01/17   Edison Pace, PA-C  montelukast (SINGULAIR) 10 MG  tablet TAKE 1 TABLET BY MOUTH EVERY DAY 05/09/18   Chrismon, Vickki Muff, PA  omeprazole (PRILOSEC) 40 MG capsule Take 1 capsule (40 mg total) by mouth daily. 02/01/17   Drinkwater, Fleeta Emmer, PA-C  sitaGLIPtin (JANUVIA) 100 MG tablet Take by mouth. 06/01/17 06/01/18  [provider]  telmisartan (MICARDIS) 40 MG tablet Take by mouth. 07/06/17 07/06/18  [provider]    Family History  Problem Relation Age of Onset  . Coronary artery disease Mother   . Coronary artery disease Father   . Diabetes Father   . Emphysema Father   . Seizures Daughter   . Cancer Maternal Grandmother   . Breast cancer Neg Hx      Social History   Tobacco Use  . Smoking status: Former Smoker    Years: 1.00    Last attempt to quit: 01/19/1985    Years since quitting: 33.3  . Smokeless tobacco: Never Used  Substance Use Topics  . Alcohol use: No    Alcohol/week: 0.0 oz    Comment: occasionally 1 beer every 6 months  . Drug use: No    Allergies as of 05/23/2018 - Review Complete 05/10/2018  Allergen Reaction Noted  . Oxycodone Itching 02/01/2017    Review of Systems:    All systems reviewed and negative except where noted in HPI.   Physical Exam:  There were no vitals taken for this visit. No LMP recorded. (Menstrual status: IUD). Psych:  Alert and cooperative. Normal mood and affect. General:   Alert,  Well-developed, well-nourished, pleasant and cooperative in NAD Head:  Normocephalic and atraumatic. Eyes:  Sclera clear, no icterus.   Conjunctiva pink. Ears:  Normal auditory acuity. Nose:  No deformity, discharge, or lesions. Mouth:  No deformity or lesions,oropharynx pink & moist. Neck:  Supple; no masses or thyromegaly. Lungs:  Respirations even and unlabored.  Clear throughout to auscultation.   No wheezes, crackles, or rhonchi. No acute distress. Heart:  Regular rate and rhythm; no murmurs, clicks, rubs, or gallops. Abdomen:  Normal bowel sounds.  No bruits.  Soft, non-tender and  non-distended without masses, hepatosplenomegaly or hernias noted.  No guarding or rebound tenderness.    Msk:  Symmetrical without gross deformities. Good, equal movement & strength bilaterally. Pulses:  Normal pulses noted. Extremities:  No clubbing or edema.  No cyanosis. Neurologic:  Alert and oriented x3;  grossly normal neurologically. Skin:  Intact without significant lesions or rashes. No jaundice. Lymph Nodes:  No significant cervical adenopathy. Psych:  Alert and cooperative. Normal mood and affect.   Labs: CBC    Component Value Date/Time   WBC 12.1 (H) 05/09/2018 2107   RBC 5.18 05/09/2018 2107   HGB 14.7 05/09/2018 2107   HGB 13.1 02/11/2017 0837   HCT 43.6 05/09/2018 2107   HCT 38.9 02/11/2017 0837   PLT 176 05/09/2018 2107   PLT 319 02/11/2017 0837   MCV 84.1  05/09/2018 2107   MCV 86 02/11/2017 0837   MCH 28.3 05/09/2018 2107   MCHC 33.7 05/09/2018 2107   RDW 13.7 05/09/2018 2107   RDW 14.2 02/11/2017 0837   LYMPHSABS 2.5 02/11/2017 0837   MONOABS 0.5 01/19/2017 1018   EOSABS 0.1 02/11/2017 0837   BASOSABS 0.0 02/11/2017 0837   CMP     Component Value Date/Time   NA 136 05/09/2018 2107   NA 136 12/25/2016 1102   K 3.7 05/09/2018 2107   CL 104 05/09/2018 2107   CO2 21 (L) 05/09/2018 2107   GLUCOSE 149 (H) 05/09/2018 2107   BUN 15 05/09/2018 2107   BUN 10 12/25/2016 1102   CREATININE 0.54 05/09/2018 2107   CALCIUM 9.1 05/09/2018 2107   PROT 7.2 05/09/2018 2107   PROT 6.8 12/25/2016 1102   ALBUMIN 4.0 05/09/2018 2107   ALBUMIN 4.2 12/25/2016 1102   AST 56 (H) 05/09/2018 2107   ALT 91 (H) 05/09/2018 2107   ALKPHOS 83 05/09/2018 2107   BILITOT 0.4 05/09/2018 2107   BILITOT 0.4 12/25/2016 1102   GFRNONAA >60 05/09/2018 2107   GFRAA >60 05/09/2018 2107    Imaging Studies: US Abdomen Complete  Result Date: 05/11/2018 CLINICAL DATA:  Upper abdominal pain. EXAM: ABDOMEN ULTRASOUND COMPLETE COMPARISON:  None. FINDINGS: Gallbladder: No gallstones or  wall thickening visualized. No sonographic Murphy sign noted by sonographer. Common bile duct: Diameter: 4.9 mm. Liver: No focal lesion identified. Increased parenchymal echogenicity. Portal vein is patent on color Doppler imaging with normal direction of blood flow towards the liver. IVC: No abnormality visualized. Pancreas: Visualized portion unremarkable. Spleen: Size and appearance within normal limits. Right Kidney: Length: 12.4 cm. Echogenicity within normal limits. No mass or hydronephrosis visualized. Left Kidney: Length: 14.3 cm. Echogenicity within normal limits. No mass or hydronephrosis visualized. Abdominal aorta: No aneurysm visualized. Other findings: None. IMPRESSION: Hepatic steatosis.  Otherwise negative. Electronically Signed   By: Staci Righter M.D.   On: 05/11/2018 11:09    Assessment and Plan:   Kelly Matthews is a 52 y.o. y/o female has been referred for midepigastric pain  Midepigastric pain likely due to GERD Continue Prilosec Pain is improving, but not completely resolved Will not increase PPI at this time Patient educated extensively on acid reflux lifestyle modification, including buying a bed wedge, not eating 3 hrs before bedtime, diet modifications, and handout given for the same.   Due to loose stools over the last week, will rule out infectious causes with C. difficile and GI stool panel  If this is negative, can proceed with screening colonoscopy, and perform EGD at the same time due to midepigastric abdominal pain, and likely GERD  I have discussed alternative options, risks & benefits,  which include, but are not limited to, bleeding, infection, perforation,respiratory complication & drug reaction.  The patient agrees with this plan & written consent will be obtained.    I have also noted chronically elevated transaminases  ultrasound showed hepatic steatosis Will Complete liver work-up with viral and autoimmune labs No clinical evidence of cirrhosis  at this time Patient educated about fatty liver, and diet and weight loss and exercise encouraged Risk of progression to cirrhosis if above measures are not instituted were discussed as well Patient asked to avoid NSAIDs and hepatotoxic drugs, including alcohol    Dr Kelly Matthews

## 2018-05-24 ENCOUNTER — Encounter: Payer: Self-pay | Admitting: Gastroenterology

## 2018-05-24 ENCOUNTER — Other Ambulatory Visit: Payer: Self-pay

## 2018-05-24 DIAGNOSIS — K219 Gastro-esophageal reflux disease without esophagitis: Secondary | ICD-10-CM

## 2018-05-24 DIAGNOSIS — R1084 Generalized abdominal pain: Secondary | ICD-10-CM

## 2018-05-24 MED ORDER — PEG 3350-KCL-NA BICARB-NACL 420 G PO SOLR: 4000.0000 mL | Freq: Once | ORAL | 0 refills | Status: AC

## 2018-05-24 MED ORDER — BISACODYL 5 MG PO TBEC: 10.0000 mg | DELAYED_RELEASE_TABLET | Freq: Once | ORAL | 0 refills | Status: AC

## 2018-06-05 ENCOUNTER — Other Ambulatory Visit: Payer: Self-pay | Admitting: Family Medicine

## 2018-06-05 DIAGNOSIS — M79605 Pain in left leg: Secondary | ICD-10-CM

## 2018-06-15 ENCOUNTER — Ambulatory Visit
Admission: RE | Admit: 2018-06-15 | Discharge: 2018-06-15 | Disposition: A | Discharge status: Home / Self Care | Payer: Managed Care, Other (non HMO) | Source: Ambulatory Visit | Attending: Gastroenterology | Admitting: Gastroenterology

## 2018-06-15 ENCOUNTER — Ambulatory Visit: Payer: Managed Care, Other (non HMO) | Admitting: Registered Nurse

## 2018-06-15 ENCOUNTER — Encounter
Admission: RE | Disposition: A | Discharge status: Home / Self Care | Payer: Self-pay | Source: Ambulatory Visit | Attending: Gastroenterology

## 2018-06-15 DIAGNOSIS — K760 Fatty (change of) liver, not elsewhere classified: Secondary | ICD-10-CM | POA: Insufficient documentation

## 2018-06-15 DIAGNOSIS — K635 Polyp of colon: Secondary | ICD-10-CM

## 2018-06-15 DIAGNOSIS — Z79899 Other long term (current) drug therapy: Secondary | ICD-10-CM | POA: Insufficient documentation

## 2018-06-15 DIAGNOSIS — E119 Type 2 diabetes mellitus without complications: Secondary | ICD-10-CM | POA: Diagnosis not present

## 2018-06-15 DIAGNOSIS — Z87891 Personal history of nicotine dependence: Secondary | ICD-10-CM | POA: Insufficient documentation

## 2018-06-15 DIAGNOSIS — D125 Benign neoplasm of sigmoid colon: Secondary | ICD-10-CM | POA: Insufficient documentation

## 2018-06-15 DIAGNOSIS — K219 Gastro-esophageal reflux disease without esophagitis: Secondary | ICD-10-CM

## 2018-06-15 DIAGNOSIS — R1013 Epigastric pain: Secondary | ICD-10-CM

## 2018-06-15 DIAGNOSIS — L409 Psoriasis, unspecified: Secondary | ICD-10-CM | POA: Diagnosis not present

## 2018-06-15 DIAGNOSIS — Z1211 Encounter for screening for malignant neoplasm of colon: Secondary | ICD-10-CM | POA: Diagnosis present

## 2018-06-15 DIAGNOSIS — K3189 Other diseases of stomach and duodenum: Secondary | ICD-10-CM | POA: Diagnosis not present

## 2018-06-15 DIAGNOSIS — I10 Essential (primary) hypertension: Secondary | ICD-10-CM | POA: Diagnosis not present

## 2018-06-15 DIAGNOSIS — Z7984 Long term (current) use of oral hypoglycemic drugs: Secondary | ICD-10-CM | POA: Diagnosis not present

## 2018-06-15 DIAGNOSIS — K2960 Other gastritis without bleeding: Secondary | ICD-10-CM | POA: Insufficient documentation

## 2018-06-15 DIAGNOSIS — R1084 Generalized abdominal pain: Secondary | ICD-10-CM

## 2018-06-15 HISTORY — PX: COLONOSCOPY WITH PROPOFOL: SHX5780

## 2018-06-15 HISTORY — DX: Fatty (change of) liver, not elsewhere classified: K76.0

## 2018-06-15 HISTORY — PX: ESOPHAGOGASTRODUODENOSCOPY (EGD) WITH PROPOFOL: SHX5813

## 2018-06-15 LAB — KOH PREP
KOH PREP: NONE SEEN
Special Requests: NORMAL

## 2018-06-15 LAB — POCT PREGNANCY, URINE: PREG TEST UR: NEGATIVE

## 2018-06-15 SURGERY — COLONOSCOPY WITH PROPOFOL
Anesthesia: General

## 2018-06-15 MED ORDER — PHENYLEPHRINE HCL 10 MG/ML IJ SOLN
INTRAMUSCULAR | Status: DC | PRN
  Administered 2018-06-15 (×2): 50 ug via INTRAVENOUS
  Administered 2018-06-15: 100 ug via INTRAVENOUS

## 2018-06-15 MED ORDER — SUCCINYLCHOLINE CHLORIDE 20 MG/ML IJ SOLN
INTRAMUSCULAR | Status: AC
  Filled 2018-06-15: qty 1

## 2018-06-15 MED ORDER — MIDAZOLAM HCL 2 MG/2ML IJ SOLN
INTRAMUSCULAR | Status: AC
  Filled 2018-06-15: qty 2

## 2018-06-15 MED ORDER — GLYCOPYRROLATE 0.2 MG/ML IJ SOLN
INTRAMUSCULAR | Status: AC
  Filled 2018-06-15: qty 1

## 2018-06-15 MED ORDER — PROPOFOL 500 MG/50ML IV EMUL
INTRAVENOUS | Status: AC
  Filled 2018-06-15: qty 50

## 2018-06-15 MED ORDER — SODIUM CHLORIDE 0.9 % IV SOLN
INTRAVENOUS | Status: DC
  Administered 2018-06-15: 1000 mL via INTRAVENOUS

## 2018-06-15 MED ORDER — GLYCOPYRROLATE 0.2 MG/ML IJ SOLN
INTRAMUSCULAR | Status: DC | PRN
  Administered 2018-06-15: 0.2 mg via INTRAVENOUS

## 2018-06-15 MED ORDER — PROPOFOL 500 MG/50ML IV EMUL
INTRAVENOUS | Status: DC | PRN
  Administered 2018-06-15: 150 ug/kg/min via INTRAVENOUS

## 2018-06-15 MED ORDER — PROPOFOL 10 MG/ML IV BOLUS
INTRAVENOUS | Status: DC | PRN
  Administered 2018-06-15: 100 mg via INTRAVENOUS

## 2018-06-15 MED ORDER — LIDOCAINE HCL (PF) 2 % IJ SOLN
INTRAMUSCULAR | Status: AC
  Filled 2018-06-15: qty 10

## 2018-06-15 MED ORDER — MIDAZOLAM HCL 2 MG/2ML IJ SOLN
INTRAMUSCULAR | Status: DC | PRN
  Administered 2018-06-15: 2 mg via INTRAVENOUS

## 2018-06-15 NOTE — Anesthesia Procedure Notes (Signed)
Date/Time: 06/15/2018 8:13 AM Performed by: Doreen Salvage, CRNA Pre-anesthesia Checklist: Patient identified, Emergency Drugs available, Suction available and Patient being monitored Patient Re-evaluated:Patient Re-evaluated prior to induction Oxygen Delivery Method: Nasal cannula Induction Type: IV induction Dental Injury: Teeth and Oropharynx as per pre-operative assessment  Comments: Nasal cannula with etCO2 monitoring

## 2018-06-15 NOTE — Anesthesia Preprocedure Evaluation (Signed)
Anesthesia Evaluation  Patient identified by MRN, date of birth, ID band Patient awake    Reviewed: Allergy & Precautions, H&P , NPO status , Patient's Chart, lab work & pertinent test results  History of Anesthesia Complications Negative for: history of anesthetic complications  Airway Mallampati: III  TM Distance: <3 FB Neck ROM: full    Dental  (+) Chipped, Poor Dentition   Pulmonary neg shortness of breath, asthma , former smoker,           Cardiovascular Exercise Tolerance: Good hypertension, (-) angina(-) Past MI and (-) DOE      Neuro/Psych  Headaches, PSYCHIATRIC DISORDERS negative neurological ROS     GI/Hepatic negative GI ROS, Neg liver ROS,   Endo/Other  diabetes, Type 2  Renal/GU negative Renal ROS  negative genitourinary   Musculoskeletal   Abdominal   Peds  Hematology negative hematology ROS (+)   Anesthesia Other Findings Past Medical History: 12/2016: Cervical disc herniation No date: Diabetes mellitus without complication ([facility]) No date: Fatty liver No date: Hypertension No date: Psoriasis (a type of skin inflammation) No date: Tachycardia  Past Surgical History: 02/01/2017: ANTERIOR CERVICAL DECOMP/DISCECTOMY FUSION; N/A     Comment:  Procedure: ANTERIOR CERVICAL DECOMPRESSION/DISCECTOMY               FUSION 1 LEVEL;  Surgeon: Bayard Hugger, MD;  Location:               [facility] ORS;  Service: Neurosurgery;  Laterality: N/A; 04/18/2015: BREAST BIOPSY; Left     Comment:  Benign calcification 1990,2001: CESAREAN SECTION October 2015: INTRAUTERINE DEVICE INSERTION     Comment:  [facility]     Reproductive/Obstetrics negative OB ROS                             Anesthesia Physical Anesthesia Plan  ASA: III  Anesthesia Plan: General   Post-op Pain Management:    Induction: Intravenous  PONV Risk Score and Plan: Propofol infusion and TIVA  Airway Management  Planned: Natural Airway and Nasal Cannula  Additional Equipment:   Intra-op Plan:   Post-operative Plan:   Informed Consent: I have reviewed the patients History and Physical, chart, labs and discussed the procedure including the risks, benefits and alternatives for the proposed anesthesia with the patient or authorized representative who has indicated his/her understanding and acceptance.   Dental Advisory Given  Plan Discussed with: Anesthesiologist, CRNA and Surgeon  Anesthesia Plan Comments: (Patient consented for risks of anesthesia including but not limited to:  - adverse reactions to medications - risk of intubation if required - damage to teeth, lips or other oral mucosa - sore throat or hoarseness - Damage to heart, brain, lungs or loss of life  Patient voiced understanding.)        Anesthesia Quick Evaluation

## 2018-06-15 NOTE — H&P (Signed)
Kelly Antigua, MD [address] [place], [place], [place], [address] [address], [place], [place], [place], [address] Phone: [phone]  Fax: [phone]  Primary Care Physician:  Margo Common, PA   Pre-Procedure History & Physical: HPI:  Kelly Matthews is a 52 y.o. female is here for a colonoscopy and EGD.   Past Medical History:  Diagnosis Date  . 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 Diabetes mellitus without complication ([place])   . Fatty liver   . Hypertension   . Psoriasis (a type of skin inflammation)   . Tachycardia     Past Surgical History:  Procedure Laterality Date  . ANTERIOR CERVICAL DECOMP/DISCECTOMY FUSION N/A 02/01/2017   Procedure: ANTERIOR CERVICAL DECOMPRESSION/DISCECTOMY FUSION 1 LEVEL;  Surgeon: Bayard Hugger, MD;  Location: [facility] ORS;  Service: Neurosurgery;  Laterality: N/A;  . BREAST BIOPSY Left 04/18/2015   Benign calcification  . CESAREAN SECTION  1990,2001  . INTRAUTERINE DEVICE INSERTION  October 2015   [facility]    Prior to Admission medications   Medication Sig Start Date End Date Taking? Authorizing Provider  clobetasol (TEMOVATE) 0.05 % external solution APPLY TWICE DAILY TO SCALP FOR [facility] 02/24/17  Yes [provider]  Clobetasol Propionate 0.05 % shampoo SHAMPOO DAILY. LEAVE IN PLACE FOR 30 MINUTES AND RINSE 02/24/17  Yes [provider]  Dapagliflozin-Metformin HCl ER 03-999 MG TB24 Take by mouth. 02/22/17  Yes [provider]  diltiazem (DILACOR XR) 120 MG 24 hr capsule Take 120 mg by mouth every morning.  12/03/14  Yes [provider]  fluticasone (FLONASE) 50 MCG/ACT nasal spray SPRAY 2 SPRAYS IN EACH NOSTRIL DAILY Patient taking differently: Place 1-2 sprays into both nostrils daily as needed for allergies.  07/26/15  Yes Chrismon, Vickki Muff, PA  montelukast (SINGULAIR) 10 MG tablet TAKE 1 TABLET BY MOUTH EVERY DAY 05/09/18  Yes Chrismon, Vickki Muff, PA  sitaGLIPtin (JANUVIA) 100 MG  tablet Take by mouth. 06/01/17 06/15/18 Yes [provider]  telmisartan (MICARDIS) 40 MG tablet Take by mouth. 07/06/17 07/06/18 Yes [provider]  aluminum-magnesium hydroxide-simethicone (MAALOX) [phone] MG/5ML SUSP Take 30 mLs by mouth 4 (four) times daily -  before meals and at bedtime. Patient not taking: Reported on 05/24/2018 05/10/18   Carrie Mew, MD  amitriptyline (ELAVIL) 10 MG tablet TAKE 1 TABLET BY MOUTH THREE TIMES A DAY 05/23/18   Chrismon, Vickki Muff, PA  B-D INS SYR ULTRAFINE 1CC/31G 31G X 5/16" 1 ML MISC USE ONE SYRINGE FOR EACH DOSAGE (NOVOLOG THREE TIMES A DAY AND LANTUS ONCE A DAY) 12/10/16   [provider]  famotidine (PEPCID) 20 MG tablet Take 1 tablet (20 mg total) by mouth 2 (two) times daily. 05/10/18   Carrie Mew, MD  glucose blood (ACCU-CHEK ACTIVE STRIPS) test strip Use as instructed 11/02/17   Chrismon, Vickki Muff, PA  hyoscyamine (LEVSIN, ANASPAZ) 0.125 MG tablet Take 1 tablet (0.125 mg total) by mouth every 4 (four) hours as needed. Patient not taking: Reported on 05/24/2018 05/10/18   Chrismon, Vickki Muff, PA  Lancets (ACCU-CHEK SOFT TOUCH) lancets Use as instructed 11/02/17   Chrismon, Vickki Muff, PA  meloxicam (MOBIC) 7.5 MG tablet TAKE 1 TABLET BY MOUTH EVERY DAY Patient not taking: Reported on 06/15/2018 06/07/18   Virginia Crews, MD  methocarbamol (ROBAXIN) 500 MG tablet Take 1 tablet (500 mg total) by mouth 3 (three) times daily. Patient not taking: Reported on 02/23/2018 02/01/17   Edison Pace, PA-C  omeprazole (  PRILOSEC) 40 MG capsule Take 1 capsule (40 mg total) by mouth daily. Patient not taking: Reported on 06/15/2018 02/01/17   Edison Pace, PA-C    Allergies as of 05/25/2018 - Review Complete 05/24/2018  Allergen Reaction Noted  . Oxycodone Itching 02/01/2017    Family History  Problem Relation Age of Onset  . Coronary artery disease Mother   . Coronary artery disease Father   . Diabetes Father   .  Emphysema Father   . Seizures Daughter   . Cancer Maternal Grandmother   . Breast cancer Neg Hx     Social History   Socioeconomic History  . Marital status: Widowed    Spouse name: Not on file  . Number of children: Not on file  . Years of education: Not on file  . Highest education level: Not on file  Occupational History  . Not on file  Social Needs  . Financial resource strain: Not on file  . Food insecurity:    Worry: Not on file    Inability: Not on file  . Transportation needs:    Medical: Not on file    Non-medical: Not on file  Tobacco Use  . Smoking status: Former Smoker    Years: 1.00    Last attempt to quit: 01/19/1985    Years since quitting: 33.4  . Smokeless tobacco: Never Used  Substance and Sexual Activity  . Alcohol use: No    Alcohol/week: 0.0 oz    Comment: occasionally 1 beer every 6 months  . Drug use: No  . Sexual activity: Yes  Lifestyle  . Physical activity:    Days per week: Not on file    Minutes per session: Not on file  . Stress: Not on file  Relationships  . Social connections:    Talks on phone: Not on file    Gets together: Not on file    Attends religious service: Not on file    Active member of club or organization: Not on file    Attends meetings of clubs or organizations: Not on file    Relationship status: Not on file  . Intimate partner violence:    Fear of current or ex partner: Not on file    Emotionally abused: Not on file    Physically abused: Not on file    Forced sexual activity: Not on file  Other Topics Concern  . Not on file  Social History Narrative  . Not on file    Review of Systems: See HPI, otherwise negative ROS  Physical Exam: There were no vitals taken for this visit. General:   Alert,  pleasant and cooperative in NAD Head:  Normocephalic and atraumatic. Neck:  Supple; no masses or thyromegaly. Lungs:  Clear throughout to auscultation, normal respiratory effort.    Heart:  +S1, +S2, Regular rate  and rhythm, No edema. Abdomen:  Soft, nontender and nondistended. Normal bowel sounds, without guarding, and without rebound.   Neurologic:  Alert and  oriented x4;  grossly normal neurologically.  Impression/Plan: Kelly Matthews is here for a colonoscopy to be performed for average risk screening and EGD for Acid Reflux and abdominal pain.  Risks, benefits, limitations, and alternatives regarding the procedures have been reviewed with the patient.  Questions have been answered.  All parties agreeable.   Virgel Manifold, MD  06/15/2018, 8:03 AM

## 2018-06-15 NOTE — Transfer of Care (Signed)
Immediate Anesthesia Transfer of Care Note  Patient: JOLONDA GOMM  Procedure(s) Performed: Procedure(s): COLONOSCOPY WITH PROPOFOL (N/A) ESOPHAGOGASTRODUODENOSCOPY (EGD) WITH PROPOFOL (N/A)  Patient Location: PACU and Endoscopy Unit  Anesthesia Type:General  Level of Consciousness: sedated  Airway & Oxygen Therapy: Patient Spontanous Breathing and Patient connected to nasal cannula oxygen  Post-op Assessment: Report given to RN and Post -op Vital signs reviewed and stable  Post vital signs: Reviewed and stable  Last Vitals:  Vitals:   06/15/18 0802 06/15/18 0910  BP: 140/83 117/86  Pulse: 84 76  Resp: 18 15  Temp: (!) 36.1 C (!) 36.2 C  SpO2:  40%    Complications: No apparent anesthesia complications

## 2018-06-15 NOTE — Anesthesia Post-op Follow-up Note (Signed)
Anesthesia QCDR form completed.        

## 2018-06-15 NOTE — Op Note (Signed)
[facility] Gastroenterology Patient Name: Kelly Matthews Procedure Date: 06/15/2018 7:23 AM MRN: [id number] Account #: [id number] Date of Birth: [birth year] Admit Type: Outpatient Age: 52 Room: [facility] ENDO ROOM 4 Gender: Female Note Status: Finalized Procedure:            Colonoscopy Indications:          Screening for colorectal malignant neoplasm Providers:            Jarae Nemmers B. Bonna Gains MD, MD Medicines:            Monitored Anesthesia Care Complications:        No immediate complications. Procedure:            Pre-Anesthesia Assessment:                       - ASA Grade Assessment: III - A patient with severe                        systemic disease.                       - Prior to the procedure, a History and Physical was                        performed, and patient medications, allergies and                        sensitivities were reviewed. The patient's tolerance of                        previous anesthesia was reviewed.                       - The risks and benefits of the procedure and the                        sedation options and risks were discussed with the                        patient. All questions were answered and informed                        consent was obtained.                       - Patient identification and proposed procedure were                        verified prior to the procedure by the physician, the                        nurse, the anesthesiologist, the anesthetist and the                        technician. The procedure was verified in the procedure                        room.                       After obtaining informed consent, the colonoscope was  passed under direct vision. Throughout the procedure,                        the patient's blood pressure, pulse, and oxygen                        saturations were monitored continuously. The                        Colonoscope was introduced  through the anus and                        advanced to the the cecum, identified by appendiceal                        orifice and ileocecal valve. The colonoscopy was                        performed with ease. The patient tolerated the                        procedure well. The quality of the bowel preparation                        was good except the ascending colon was fair and the                        cecum was fair. Findings:      The perianal and digital rectal examinations were normal.      A 5 mm polyp was found in the sigmoid colon. The polyp was sessile. The       polyp was removed with a cold snare. Resection and retrieval were       complete.      The exam was otherwise without abnormality.      The rectum, sigmoid colon, descending colon, transverse colon, ascending       colon and cecum appeared normal.      The retroflexed view of the distal rectum and anal verge was normal and       showed no anal or rectal abnormalities. Impression:           - One 5 mm polyp in the sigmoid colon, removed with a                        cold snare. Resected and retrieved.                       - The examination was otherwise normal.                       - The rectum, sigmoid colon, descending colon,                        transverse colon, ascending colon and cecum are normal.                       - The distal rectum and anal verge are normal on                        retroflexion view. Recommendation:       -  Discharge patient to home (with escort).                       - Advance diet as tolerated.                       - Continue present medications.                       - Await pathology results.                       - Repeat colonoscopy 6-12 months with 2 day prep.                       - The findings and recommendations were discussed with                        the patient.                       - The findings and recommendations were discussed with                         the patient's family.                       - Return to primary care physician as previously                        scheduled. Procedure Code(s):    --- Professional ---                       [phone], Colonoscopy, flexible; with removal of tumor(s),                        polyp(s), or other lesion(s) by snare technique Diagnosis Code(s):    --- Professional ---                       Z12.11, Encounter for screening for malignant neoplasm                        of colon                       D12.5, Benign neoplasm of sigmoid colon CPT copyright 2017 American Medical Association. All rights reserved. The codes documented in this report are preliminary and upon coder review may  be revised to meet current compliance requirements.  Vonda Antigua, MD Margretta Sidle B. Bonna Gains MD, MD 06/15/2018 9:06:16 AM This report has been signed electronically. Number of Addenda: 0 Note Initiated On: 06/15/2018 7:23 AM Scope Withdrawal Time: 0 hours 26 minutes 14 seconds  Total Procedure Duration: 0 hours 31 minutes 41 seconds  Estimated Blood Loss: Estimated blood loss: none.      [facility]

## 2018-06-15 NOTE — Op Note (Signed)
[facility] Gastroenterology Patient Name: Kelly Matthews Procedure Date: 06/15/2018 7:24 AM MRN: [id number] Account #: [id number] Date of Birth: Oct 05, 1966 Admit Type: Outpatient Age: 52 Room: [facility] ENDO ROOM 4 Gender: Female Note Status: Finalized Procedure:            Upper GI endoscopy Indications:          Epigastric abdominal pain, Suspected gastro-esophageal                        reflux disease Providers:            Alix Stowers B. Bonna Gains MD, MD Referring MD:         Vickki Muff. Chrismon, MD (Referring MD) Medicines:            Monitored Anesthesia Care Complications:        No immediate complications. Procedure:            Pre-Anesthesia Assessment:                       - Prior to the procedure, a History and Physical was                        performed, and patient medications, allergies and                        sensitivities were reviewed. The patient's tolerance of                        previous anesthesia was reviewed.                       - The risks and benefits of the procedure and the                        sedation options and risks were discussed with the                        patient. All questions were answered and informed                        consent was obtained.                       - Patient identification and proposed procedure were                        verified prior to the procedure by the physician, the                        nurse, the anesthesiologist, the anesthetist and the                        technician. The procedure was verified in the procedure                        room.                       - ASA Grade Assessment: III - A patient with severe  systemic disease.                       After obtaining informed consent, the endoscope was                        passed under direct vision. Throughout the procedure,                        the patient's blood pressure, pulse, and oxygen                     saturations were monitored continuously. The Endoscope                        was introduced through the mouth, and advanced to the                        third part of duodenum. The upper GI endoscopy was                        accomplished with ease. The patient tolerated the                        procedure well. Findings:      The Z-line was regular and was found 39 cm from the incisors.      White nummular lesions were noted in the mid esophagus. Brushings for       KOH prep were obtained in the mid esophagus.      A few dispersed, 2 to 4 mm non-bleeding erosions were found in the       gastric fundus and in the gastric antrum. There were no stigmata of       recent bleeding.      Patchy mildly erythematous mucosa without bleeding was found in the       gastric antrum. Biopsies were taken with a cold forceps for histology.       Biopsies were obtained in the gastric body, at the incisura and in the       gastric antrum with cold forceps for histology.      Patchy mildly erythematous mucosa without active bleeding and with no       stigmata of bleeding was found in the duodenal bulb.      The second portion of the duodenum, third portion of the duodenum and       examined duodenum were normal. Impression:           - Z-line regular, 39 cm from the incisors.                       - White nummular lesions in esophageal mucosa.                        Brushings performed.                       - Non-bleeding erosive gastropathy.                       - Erythematous mucosa in the antrum. Biopsied.                       -  Erythematous duodenopathy.                       - Normal second portion of the duodenum, third portion                        of the duodenum and examined duodenum.                       - Biopsies were obtained in the gastric body, at the                        incisura and in the gastric antrum. Recommendation:       - Await pathology results.                        - Discharge patient to home (with escort).                       - Advance diet as tolerated.                       - Continue present medications.                       - Patient has a contact number available for                        emergencies. The signs and symptoms of potential                        delayed complications were discussed with the patient.                        Return to normal activities tomorrow. Written discharge                        instructions were provided to the patient.                       - Discharge patient to home (with escort).                       - The findings and recommendations were discussed with                        the patient.                       - The findings and recommendations were discussed with                        the patient's family.                       - Follow an antireflux regimen. Procedure Code(s):    --- Professional ---                       [phone], Esophagogastroduodenoscopy, flexible, transoral;                        with biopsy, single or multiple Diagnosis Code(s):    ---  Professional ---                       K22.8, Other specified diseases of esophagus                       K31.89, Other diseases of stomach and duodenum                       R10.13, Epigastric pain CPT copyright 2017 American Medical Association. All rights reserved. The codes documented in this report are preliminary and upon coder review may  be revised to meet current compliance requirements.  Vonda Antigua, MD Margretta Sidle B. Bonna Gains MD, MD 06/15/2018 8:26:57 AM This report has been signed electronically. Number of Addenda: 0 Note Initiated On: 06/15/2018 7:24 AM Estimated Blood Loss: Estimated blood loss: none.      [facility]

## 2018-06-15 NOTE — Anesthesia Postprocedure Evaluation (Signed)
Anesthesia Post Note  Patient: SHADI LARNER  Procedure(s) Performed: COLONOSCOPY WITH PROPOFOL (N/A ) ESOPHAGOGASTRODUODENOSCOPY (EGD) WITH PROPOFOL (N/A )  Patient location during evaluation: Endoscopy Anesthesia Type: General Level of consciousness: awake and alert Pain management: pain level controlled Vital Signs Assessment: post-procedure vital signs reviewed and stable Respiratory status: spontaneous breathing, nonlabored ventilation, respiratory function stable and patient connected to nasal cannula oxygen Cardiovascular status: blood pressure returned to baseline and stable Postop Assessment: no apparent nausea or vomiting Anesthetic complications: no     Last Vitals:  Vitals:   06/15/18 0930 06/15/18 0940  BP: 109/72 120/73  Pulse: 78 74  Resp: 17 16  Temp:    SpO2: 100% 99%    Last Pain:  Vitals:   06/15/18 0930  TempSrc:   PainSc: 0-No pain                 Precious Haws Piscitello

## 2018-06-16 ENCOUNTER — Encounter: Payer: Self-pay | Admitting: Gastroenterology

## 2018-06-17 ENCOUNTER — Other Ambulatory Visit
Admission: RE | Admit: 2018-06-17 | Discharge: 2018-06-17 | Disposition: A | Discharge status: Home / Self Care | Payer: Managed Care, Other (non HMO) | Source: Ambulatory Visit | Attending: Gastroenterology | Admitting: Gastroenterology

## 2018-06-17 ENCOUNTER — Telehealth: Payer: Self-pay

## 2018-06-17 ENCOUNTER — Telehealth: Payer: Self-pay | Admitting: Gastroenterology

## 2018-06-17 DIAGNOSIS — R195 Other fecal abnormalities: Secondary | ICD-10-CM | POA: Diagnosis present

## 2018-06-17 DIAGNOSIS — K921 Melena: Secondary | ICD-10-CM

## 2018-06-17 LAB — HEMOGLOBIN: Hemoglobin: 13 g/dL (ref 12.0–16.0)

## 2018-06-17 NOTE — Progress Notes (Signed)
I called the patient to inform of her of normal results. Pt was reassured and is aware of normal results. Her stool is now brown in color. No abdominal pain or diarrhea. She was asked to call us back if black stool reoccurs. She was asked to discontinue PPI in 2 weeks if no further black stool. She verbalized understanding.

## 2018-06-17 NOTE — Telephone Encounter (Signed)
-----   Message from Virgel Manifold, MD sent at 06/17/2018  8:22 AM EDT ----- Kelly Matthews, can you call this patient today. She called Dr. Marius Ditch who was on call last night and said she was having black stool.  Dr. Marius Ditch, asked her to watch her stool, and report back today morning.  Please call her and ask her how she is doing, and if she still having black stool, and when her last bowel movement was and if it was still black.  Also order a hemoglobin stat and ask her to go to the hospital lab to get this done today.

## 2018-06-17 NOTE — Telephone Encounter (Signed)
Patient LVM for results. Please call

## 2018-06-17 NOTE — Telephone Encounter (Signed)
Pt returns call. She states she has not had any further black stools (no bm at all) since that episode. She is feeling better today. Yesterday her stomach felt achy. The doctor on call told pt to take Prilosec twice a day and this helped so far. Pt advised to have blood work done at [facility] [place] (hgb) and she is at work and will do this after 2pm, when she gets off.

## 2018-06-17 NOTE — Telephone Encounter (Signed)
Left message for pt to contact office

## 2018-06-18 LAB — SURGICAL PATHOLOGY

## 2018-06-21 ENCOUNTER — Ambulatory Visit
Admission: RE | Admit: 2018-06-21 | Discharge: 2018-06-21 | Disposition: A | Discharge status: Home / Self Care | Payer: Managed Care, Other (non HMO) | Source: Ambulatory Visit | Attending: Family Medicine | Admitting: Family Medicine

## 2018-06-21 DIAGNOSIS — Z1231 Encounter for screening mammogram for malignant neoplasm of breast: Secondary | ICD-10-CM | POA: Diagnosis present

## 2018-06-27 ENCOUNTER — Telehealth: Payer: Self-pay

## 2018-06-27 ENCOUNTER — Encounter: Payer: Self-pay | Admitting: Gastroenterology

## 2018-06-27 NOTE — Telephone Encounter (Signed)
-----   Message from Margo Common, [place] sent at 06/24/2018  4:08 PM EDT ----- Normal mammograms. Repeat annually.

## 2018-06-27 NOTE — Telephone Encounter (Signed)
Pt advised.   Thanks,   -Makaiah Terwilliger  

## 2018-07-05 NOTE — Telephone Encounter (Signed)
Left message to contact office

## 2018-07-22 ENCOUNTER — Encounter: Payer: Self-pay | Admitting: Family Medicine

## 2018-07-22 ENCOUNTER — Ambulatory Visit: Payer: Managed Care, Other (non HMO) | Admitting: Family Medicine

## 2018-07-22 VITALS — BP 126/80 | HR 84 | Temp 97.8°F | Wt 225.0 lb

## 2018-07-22 DIAGNOSIS — M722 Plantar fascial fibromatosis: Secondary | ICD-10-CM

## 2018-07-22 DIAGNOSIS — R252 Cramp and spasm: Secondary | ICD-10-CM | POA: Diagnosis not present

## 2018-07-22 DIAGNOSIS — E118 Type 2 diabetes mellitus with unspecified complications: Secondary | ICD-10-CM

## 2018-07-22 MED ORDER — DICLOFENAC-MISOPROSTOL 75-0.2 MG PO TBEC: 1.0000 | DELAYED_RELEASE_TABLET | Freq: Two times a day (BID) | ORAL | 0 refills | Status: DC | PRN

## 2018-07-22 NOTE — Patient Instructions (Signed)

## 2018-07-22 NOTE — Progress Notes (Signed)
Patient: Kelly Matthews Female    DOB: 17-May-1966   52 y.o.   MRN: [id number] Visit Date: 07/22/2018  Today's Provider: [name], PA   Chief Complaint  Patient presents with  . Foot Pain    Left foot started about a month ago.  . Hand Pain    Right hand and fingers.  . Nausea    Started about 2 weeks ago.   Subjective:    Foot Injury   There was no injury mechanism. The pain is present in the left foot. Pertinent negatives include no inability to bear weight, loss of motion, loss of sensation, muscle weakness, numbness or tingling. She reports no foreign bodies present. The symptoms are aggravated by palpation and weight bearing. She has tried NSAIDs (Pt was prescribed Nabumetone 500mg  a day through a Doctor at work.  She states it helps some but is still having a lot of pain. ) for the symptoms. The treatment provided mild relief.  Hand Pain   There was no injury mechanism. The pain is present in the right fingers and right hand. The quality of the pain is described as cramping and aching. Pertinent negatives include no muscle weakness, numbness or tingling. She has tried NSAIDs for the symptoms. The treatment provided no relief.      Past Medical History:  Diagnosis Date  . 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 Diabetes mellitus without complication ([place])   . Fatty liver   . Hypertension   . Psoriasis (a type of skin inflammation)   . Tachycardia    Past Surgical History:  Procedure Laterality Date  . ANTERIOR CERVICAL DECOMP/DISCECTOMY FUSION N/A 02/01/2017   Procedure: ANTERIOR CERVICAL DECOMPRESSION/DISCECTOMY FUSION 1 LEVEL;  Surgeon: Bayard Hugger, MD;  Location: [facility] ORS;  Service: Neurosurgery;  Laterality: N/A;  . BREAST BIOPSY Left 04/18/2015   Benign calcification  . CESAREAN SECTION  1990,2001  . COLONOSCOPY WITH PROPOFOL N/A 06/15/2018   Procedure: COLONOSCOPY WITH PROPOFOL;  Surgeon: Virgel Manifold, MD;  Location: [facility] ENDOSCOPY;  Service:  Endoscopy;  Laterality: N/A;  . ESOPHAGOGASTRODUODENOSCOPY (EGD) WITH PROPOFOL N/A 06/15/2018   Procedure: ESOPHAGOGASTRODUODENOSCOPY (EGD) WITH PROPOFOL;  Surgeon: Virgel Manifold, MD;  Location: [facility] ENDOSCOPY;  Service: Endoscopy;  Laterality: N/A;  . INTRAUTERINE DEVICE INSERTION  October 2015   [facility]   Family History  Problem Relation Age of Onset  . Coronary artery disease Mother   . Coronary artery disease Father   . Diabetes Father   . Emphysema Father   . Seizures Daughter   . Cancer Maternal Grandmother   . Breast cancer Neg Hx    Allergies  Allergen Reactions  . Oxycodone Itching    Current Outpatient Medications:  .  amitriptyline (ELAVIL) 10 MG tablet, TAKE 1 TABLET BY MOUTH THREE TIMES A DAY, Disp: 270 tablet, Rfl: 0 .  B-D INS SYR ULTRAFINE 1CC/31G 31G X 5/16" 1 ML MISC, USE ONE SYRINGE FOR EACH DOSAGE (NOVOLOG THREE TIMES A DAY AND LANTUS ONCE A DAY), Disp: , Rfl: 3 .  clobetasol (TEMOVATE) 0.05 % external solution, APPLY TWICE DAILY TO SCALP FOR ITCH, Disp: , Rfl: 4 .  Clobetasol Propionate 0.05 % shampoo, SHAMPOO DAILY. LEAVE IN PLACE FOR 30 MINUTES AND RINSE, Disp: , Rfl: 3 .  Dapagliflozin-Metformin HCl ER 03-999 MG TB24, Take by mouth., Disp: , Rfl:  .  diltiazem (DILACOR XR) 120 MG 24 hr capsule, Take 120 mg by mouth every morning. , Disp: ,  Rfl:  .  famotidine (PEPCID) 20 MG tablet, Take 1 tablet (20 mg total) by mouth 2 (two) times daily., Disp: 60 tablet, Rfl: 0 .  fluticasone (FLONASE) 50 MCG/ACT nasal spray, SPRAY 2 SPRAYS IN EACH NOSTRIL DAILY (Patient taking differently: Place 1-2 sprays into both nostrils daily as needed for allergies. ), Disp: 16 g, Rfl: 5 .  glucose blood (ACCU-CHEK ACTIVE STRIPS) test strip, Use as instructed, Disp: 300 each, Rfl: 3 .  hyoscyamine (LEVSIN, ANASPAZ) 0.125 MG tablet, Take 1 tablet (0.125 mg total) by mouth every 4 (four) hours as needed., Disp: 30 tablet, Rfl: 0 .  Lancets (ACCU-CHEK SOFT TOUCH) lancets,  Use as instructed, Disp: 300 each, Rfl: 3 .  montelukast (SINGULAIR) 10 MG tablet, TAKE 1 TABLET BY MOUTH EVERY DAY, Disp: 90 tablet, Rfl: 1 .  telmisartan (MICARDIS) 40 MG tablet, TAKE 1 TABLET BY MOUTH EVERY DAY, Disp: , Rfl:  .  aluminum-magnesium hydroxide-simethicone (MAALOX) 329-924-26 MG/5ML SUSP, Take 30 mLs by mouth 4 (four) times daily -  before meals and at bedtime. (Patient not taking: Reported on 07/22/2018), Disp: 355 mL, Rfl: 0 .  meloxicam (MOBIC) 7.5 MG tablet, TAKE 1 TABLET BY MOUTH EVERY DAY (Patient not taking: Reported on 06/15/2018), Disp: 30 tablet, Rfl: 0 .  methocarbamol (ROBAXIN) 500 MG tablet, Take 1 tablet (500 mg total) by mouth 3 (three) times daily. (Patient not taking: Reported on 02/23/2018), Disp: 60 tablet, Rfl: 0 .  nabumetone (RELAFEN) 500 MG tablet, TAKE 1 TAB(S) ORALLY ONCE A DAY AS NEEDED, Disp: , Rfl: 0 .  omeprazole (PRILOSEC) 40 MG capsule, Take 1 capsule (40 mg total) by mouth daily. (Patient not taking: Reported on 06/15/2018), Disp: 30 capsule, Rfl: 0 .  sitaGLIPtin (JANUVIA) 100 MG tablet, Take by mouth., Disp: , Rfl:  .  telmisartan (MICARDIS) 40 MG tablet, Take by mouth., Disp: , Rfl:   Review of Systems  Constitutional: Positive for fatigue. Negative for activity change, appetite change, chills, diaphoresis, fever and unexpected weight change.  Respiratory: Negative.   Cardiovascular: Negative.   Gastrointestinal: Positive for nausea. Negative for abdominal distention, abdominal pain, anal bleeding, blood in stool, constipation, diarrhea, rectal pain and vomiting.  Musculoskeletal: Positive for arthralgias, gait problem and myalgias. Negative for back pain, joint swelling, neck pain and neck stiffness.  Neurological: Negative for dizziness, tingling, light-headedness, numbness and headaches.   Social History   Tobacco Use  . Smoking status: Former Smoker    Years: 1.00    Last attempt to quit: 01/19/1985    Years since quitting: 33.5  . Smokeless  tobacco: Never Used  Substance Use Topics  . Alcohol use: No    Alcohol/week: 0.0 standard drinks    Comment: occasionally 1 beer every 6 months   Objective:   BP 126/80 (BP Location: Right Arm, Patient Position: Sitting, Cuff Size: Large)   Pulse 84   Temp 97.8 F (36.6 C) (Oral)   Wt 225 lb (102.1 kg)   SpO2 97%   BMI 45.44 kg/m  Vitals:   07/22/18 0856  BP: 126/80  Pulse: 84  Temp: 97.8 F (36.6 C)  TempSrc: Oral  SpO2: 97%  Weight: 225 lb (102.1 kg)   Physical Exam  Constitutional: She is oriented to person, place, and time. She appears well-developed and well-nourished. No distress.  HENT:  Head: Normocephalic and atraumatic.  Right Ear: Hearing normal.  Left Ear: Hearing normal.  Nose: Nose normal.  Eyes: Conjunctivae and lids are normal. Right eye exhibits no  discharge. Left eye exhibits no discharge. No scleral icterus.  Cardiovascular: Normal rate and regular rhythm.  Pulmonary/Chest: Effort normal and breath sounds normal. No respiratory distress.  Musculoskeletal: Normal range of motion. She exhibits tenderness.  Right foot plantar surface of the arch and anterior heel. Increased discomfort to dorsiflex and abduct foot. No nodules or numbness. Good pulses.  Neurological: She is alert and oriented to person, place, and time.  Skin: Skin is intact. No lesion and no rash noted.  Psychiatric: She has a normal mood and affect. Her speech is normal and behavior is normal. Thought content normal.      Assessment & Plan:     1. Plantar fasciitis of right foot Onset the past month without known injury. Slight help with Nabumetone but still having sharp pains int he heel area when she first stands in the morning or after sitting for a while. Will change to Arthrotec and stop the Relafen. Recommend ice/cold massage with stretching exercises. May need evaluation by her podiatrist if no better in 10-14 days. - Diclofenac-miSOPROStol 75-0.2 MG TBEC; Take 1 tablet by mouth 2  (two) times daily as needed.  Dispense: 60 tablet; Refill: 0  2. Muscle cramps Having some cramps in hands over the past couple weeks. Will check magnesium and CMP to rule out electrolyte deficiencies. Increase fluid intake and recheck pending lab reports. - Magnesium - Comprehensive metabolic panel  3. Type 2 diabetes mellitus with complication, without long-term current use of insulin ([place]) Well controlled glucose with Hgb A1C 6.0% on 03-11-18. Followed by Dr. Gabriel Carina (endocrinologist), taking ARB for microalbuminuria, continue to monitor FBS daily, Xigduo-XR 03/999 mg 2 tablets qd, Januvia 100 mg qd and work on weight loss. Recheck CMP and follow up with Dr. Gabriel Carina in Dec. 2019 as planned. - Comprehensive metabolic panel       [name], PA  [facility]

## 2018-07-23 LAB — COMPREHENSIVE METABOLIC PANEL
ALBUMIN: 4.2 g/dL (ref 3.5–5.5)
ALK PHOS: 103 IU/L (ref 39–117)
ALT: 47 IU/L — ABNORMAL HIGH (ref 0–32)
AST: 22 IU/L (ref 0–40)
Albumin/Globulin Ratio: 1.8 (ref 1.2–2.2)
BILIRUBIN TOTAL: 0.3 mg/dL (ref 0.0–1.2)
BUN / CREAT RATIO: 18 (ref 9–23)
BUN: 10 mg/dL (ref 6–24)
CHLORIDE: 100 mmol/L (ref 96–106)
CO2: 21 mmol/L (ref 20–29)
Calcium: 9.6 mg/dL (ref 8.7–10.2)
Creatinine, Ser: 0.56 mg/dL — ABNORMAL LOW (ref 0.57–1.00)
GFR calc Af Amer: 125 mL/min/{1.73_m2} (ref >59)
GFR calc non Af Amer: 108 mL/min/{1.73_m2} (ref >59)
GLUCOSE: 88 mg/dL (ref 65–99)
Globulin, Total: 2.4 g/dL (ref 1.5–4.5)
Potassium: 4 mmol/L (ref 3.5–5.2)
Sodium: 139 mmol/L (ref 134–144)
Total Protein: 6.6 g/dL (ref 6.0–8.5)

## 2018-07-23 LAB — MAGNESIUM: Magnesium: 1.7 mg/dL (ref 1.6–2.3)

## 2018-07-26 ENCOUNTER — Telehealth: Payer: Self-pay

## 2018-07-26 NOTE — Telephone Encounter (Signed)
-----   Message from Margo Common, [place] sent at 07/26/2018  7:58 AM EDT ----- All blood tests normal. Good electrolyte and magnesium levels. Blood sugar is great at 88! Try the 4 ounces of Tonic water mixed with sugar-free drink in the evenings to help with any muscle cramps/pains. Keep follow up appointment with diabetic specialist. Recheck here or go see podiatrist if plantar fasciitis not improving in 10-14 days.

## 2018-07-26 NOTE — Telephone Encounter (Signed)
Pt advised.   Thanks,   -Laura  

## 2018-08-02 NOTE — Telephone Encounter (Signed)
Letter was sent. Pt did not call back.

## 2018-08-17 ENCOUNTER — Other Ambulatory Visit: Payer: Self-pay | Admitting: Family Medicine

## 2018-08-17 ENCOUNTER — Ambulatory Visit: Payer: Managed Care, Other (non HMO) | Admitting: Obstetrics and Gynecology

## 2018-08-17 DIAGNOSIS — M722 Plantar fascial fibromatosis: Secondary | ICD-10-CM

## 2018-08-22 ENCOUNTER — Other Ambulatory Visit: Payer: Self-pay | Admitting: Family Medicine

## 2018-08-23 ENCOUNTER — Ambulatory Visit: Payer: Managed Care, Other (non HMO) | Admitting: Gastroenterology

## 2018-08-24 ENCOUNTER — Ambulatory Visit: Payer: Managed Care, Other (non HMO) | Admitting: Gastroenterology

## 2018-08-24 ENCOUNTER — Encounter: Payer: Self-pay | Admitting: Gastroenterology

## 2018-08-24 DIAGNOSIS — K3189 Other diseases of stomach and duodenum: Secondary | ICD-10-CM

## 2018-08-27 ENCOUNTER — Encounter: Payer: Self-pay | Admitting: Emergency Medicine

## 2018-08-27 ENCOUNTER — Emergency Department: Payer: Managed Care, Other (non HMO)

## 2018-08-27 ENCOUNTER — Emergency Department
Admission: EM | Admit: 2018-08-27 | Discharge: 2018-08-27 | Disposition: A | Discharge status: Home / Self Care | Payer: Managed Care, Other (non HMO) | Attending: Emergency Medicine | Admitting: Emergency Medicine

## 2018-08-27 ENCOUNTER — Other Ambulatory Visit: Payer: Self-pay

## 2018-08-27 DIAGNOSIS — Z87891 Personal history of nicotine dependence: Secondary | ICD-10-CM | POA: Insufficient documentation

## 2018-08-27 DIAGNOSIS — I10 Essential (primary) hypertension: Secondary | ICD-10-CM | POA: Diagnosis not present

## 2018-08-27 DIAGNOSIS — R079 Chest pain, unspecified: Secondary | ICD-10-CM | POA: Diagnosis not present

## 2018-08-27 DIAGNOSIS — E119 Type 2 diabetes mellitus without complications: Secondary | ICD-10-CM | POA: Insufficient documentation

## 2018-08-27 DIAGNOSIS — Z7984 Long term (current) use of oral hypoglycemic drugs: Secondary | ICD-10-CM | POA: Insufficient documentation

## 2018-08-27 DIAGNOSIS — Z79899 Other long term (current) drug therapy: Secondary | ICD-10-CM | POA: Diagnosis not present

## 2018-08-27 LAB — CBC
HEMATOCRIT: 44 % (ref 36.0–46.0)
Hemoglobin: 14.9 g/dL (ref 12.0–15.0)
MCH: 28.4 pg (ref 26.0–34.0)
MCHC: 33.9 g/dL (ref 30.0–36.0)
MCV: 83.8 fL (ref 80.0–100.0)
PLATELETS: 218 10*3/uL (ref 150–400)
RBC: 5.25 MIL/uL — ABNORMAL HIGH (ref 3.87–5.11)
RDW: 13.2 % (ref 11.5–15.5)
WBC: 13 10*3/uL — AB (ref 4.0–10.5)
nRBC: 0 % (ref 0.0–0.2)

## 2018-08-27 LAB — COMPREHENSIVE METABOLIC PANEL
ALBUMIN: 4.4 g/dL (ref 3.5–5.0)
ALT: 105 U/L — ABNORMAL HIGH (ref 0–44)
ANION GAP: 11 (ref 5–15)
AST: 52 U/L — ABNORMAL HIGH (ref 15–41)
Alkaline Phosphatase: 88 U/L (ref 38–126)
BUN: 14 mg/dL (ref 6–20)
CO2: 25 mmol/L (ref 22–32)
Calcium: 10.1 mg/dL (ref 8.9–10.3)
Chloride: 104 mmol/L (ref 98–111)
Creatinine, Ser: 0.66 mg/dL (ref 0.44–1.00)
GFR calc non Af Amer: >60 mL/min (ref >60)
GLUCOSE: 110 mg/dL — AB (ref 70–99)
POTASSIUM: 3.7 mmol/L (ref 3.5–5.1)
SODIUM: 140 mmol/L (ref 135–145)
TOTAL PROTEIN: 7.4 g/dL (ref 6.5–8.1)
Total Bilirubin: 0.8 mg/dL (ref 0.3–1.2)

## 2018-08-27 LAB — TROPONIN I
Troponin I: <0.03 ng/mL (ref <0.03)
Troponin I: <0.03 ng/mL (ref <0.03)

## 2018-08-27 MED ORDER — KETOROLAC TROMETHAMINE 30 MG/ML IJ SOLN
30.0000 mg | Freq: Once | INTRAMUSCULAR | Status: AC
  Administered 2018-08-27: 30 mg via INTRAVENOUS
  Filled 2018-08-27: qty 1

## 2018-08-27 NOTE — ED Provider Notes (Signed)
[facility] Emergency Department Provider Note  ___________________________________________   First MD Initiated Contact with Patient 08/27/18 1858     (approximate)  I have reviewed the triage vital signs and the nursing notes.   HISTORY  Chief Complaint Chest Pain   HPI Kelly Matthews is a 52 y.o. female with a history of diabetes as well as hypertension and tachycardia who is presented to the emergency department with right-sided chest pain that started earlier this afternoon. She stated that she was at work at [employer] when the pain started. She describes as a tightness that worsens with movement of her right upper extremity. Denies any shortness of breath, nausea vomiting diarrhea or diaphoresis. No history of heart attacks in her personally. Patient denies smoking, drinking or drug use. Says the pain is been constant for hours now without any alleviating factors. Patient has not taken any medication for pain thus far.patient says that the pain has begun to radiate through to her back as well.  Past Medical History:  Diagnosis Date  . 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 Diabetes mellitus without complication ([place])   . Fatty liver   . Hypertension   . Psoriasis (a type of skin inflammation)   . Tachycardia     Patient Active Problem List   Diagnosis Date Noted  . Stomach irritation   . Abdominal pain, epigastric   . Special screening for malignant neoplasms, colon   . Polyp of sigmoid colon   . Right leg pain 02/24/2018  . Women's annual routine gynecological examination 05/14/2017  . Malnutrition of moderate degree 12/07/2016  . DKA (diabetic ketoacidoses) ([place]) 12/05/2016  . Migraine headache 05/05/2016  . Allergic rhinitis 07/23/2015  . Excess, menstruation 07/19/2015  . 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 Blood glucose elevated 07/19/2015  . Airway hyperreactivity 07/19/2015  . Disorder of esophagus 07/19/2015  .  Hypercholesterolemia without hypertriglyceridemia 07/19/2015  . Borderline diabetes 07/19/2015  . Diabetes ([place]) 11/28/2014  . Essential (primary) hypertension 11/28/2014  . Aortic heart valve narrowing 11/28/2014  . 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 Supraventricular tachycardia ([place]) 11/28/2014  . 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 ANTERIOR CERVICAL DECOMP/DISCECTOMY FUSION N/A 02/01/2017   Procedure: ANTERIOR CERVICAL DECOMPRESSION/DISCECTOMY FUSION 1 LEVEL;  Surgeon: Bayard Hugger, MD;  Location: [facility] ORS;  Service: Neurosurgery;  Laterality: N/A;  . BREAST BIOPSY Left 04/18/2015   Benign calcification  . CESAREAN SECTION  1990,2001  . COLONOSCOPY WITH PROPOFOL N/A 06/15/2018   Procedure: COLONOSCOPY WITH PROPOFOL;  Surgeon: Virgel Manifold, MD;  Location: [facility] ENDOSCOPY;  Service: Endoscopy;  Laterality: N/A;  . ESOPHAGOGASTRODUODENOSCOPY (EGD) WITH PROPOFOL N/A 06/15/2018   Procedure: ESOPHAGOGASTRODUODENOSCOPY (EGD) WITH PROPOFOL;  Surgeon: Virgel Manifold, MD;  Location: [facility] ENDOSCOPY;  Service: Endoscopy;  Laterality: N/A;  . INTRAUTERINE DEVICE INSERTION  October 2015   [facility]    Prior to Admission medications   Medication Sig Start Date End Date Taking? Authorizing Provider  aluminum-magnesium hydroxide-simethicone (MAALOX) 099-833-82 MG/5ML SUSP Take 30 mLs by mouth 4 (four) times daily -  before meals and at bedtime. Patient not taking: Reported on 07/22/2018 05/10/18   Carrie Mew, MD  amitriptyline (ELAVIL) 10 MG tablet TAKE 1 TABLET BY MOUTH THREE TIMES A DAY 08/22/18   Chrismon, Vickki Muff, PA  B-D INS SYR ULTRAFINE 1CC/31G 31G X 5/16" 1 ML MISC USE ONE SYRINGE FOR EACH DOSAGE (  NOVOLOG THREE TIMES A DAY AND LANTUS ONCE A DAY) 12/10/16   [provider]  clobetasol (TEMOVATE) 0.05 % external solution APPLY TWICE DAILY TO SCALP FOR [facility] 02/24/17   [provider]  Clobetasol Propionate 0.05 % shampoo SHAMPOO DAILY. LEAVE IN PLACE FOR 30 MINUTES AND RINSE 02/24/17   [provider]  Dapagliflozin-Metformin HCl ER 03-999 MG TB24 Take by mouth. 02/22/17   [provider]  Diclofenac-miSOPROStol 75-0.2 MG TBEC TAKE 1 TABLET BY MOUTH TWICE A DAY AS NEEDED 08/18/18   Chrismon, Vickki Muff, PA  diltiazem (DILACOR XR) 120 MG 24 hr capsule Take 120 mg by mouth every morning.  12/03/14   [provider]  famotidine (PEPCID) 20 MG tablet Take 1 tablet (20 mg total) by mouth 2 (two) times daily. 05/10/18   Carrie Mew, MD  fluticasone (FLONASE) 50 MCG/ACT nasal spray SPRAY 2 SPRAYS IN EACH NOSTRIL DAILY Patient taking differently: Place 1-2 sprays into both nostrils daily as needed for allergies.  07/26/15   Chrismon, Vickki Muff, PA  glucose blood (ACCU-CHEK ACTIVE STRIPS) test strip Use as instructed 11/02/17   Chrismon, Vickki Muff, PA  Lancets (ACCU-CHEK SOFT TOUCH) lancets Use as instructed 11/02/17   Chrismon, Vickki Muff, PA  methocarbamol (ROBAXIN) 500 MG tablet Take 1 tablet (500 mg total) by mouth 3 (three) times daily. Patient not taking: Reported on 02/23/2018 02/01/17   Edison Pace, PA-C  montelukast (SINGULAIR) 10 MG tablet TAKE 1 TABLET BY MOUTH EVERY DAY 05/09/18   Chrismon, Vickki Muff, PA  sitaGLIPtin (JANUVIA) 100 MG tablet Take by mouth. 06/01/17 06/15/18  [provider]    Allergies Oxycodone  Family History  Problem Relation Age of Onset  . Coronary artery disease Mother   . Coronary artery disease Father   . Diabetes Father   . Emphysema Father   . Seizures Daughter   . Cancer Maternal Grandmother   . Breast cancer Neg Hx     Social History Social History   Tobacco Use  . Smoking status: Former Smoker    Years: 1.00    Last attempt to quit: 01/19/1985    Years since quitting: 33.6  . Smokeless tobacco: Never Used  Substance Use Topics  . Alcohol use: No    Alcohol/week: 0.0 standard drinks      Comment: occasionally 1 beer every 6 months  . Drug use: No    Review of Systems  Constitutional: No fever/chills Eyes: No visual changes. ENT: No sore throat. Cardiovascular: as above Respiratory: Denies shortness of breath. Gastrointestinal: No abdominal pain.  No nausea, no vomiting.  No diarrhea.  No constipation. Genitourinary: Negative for dysuria. Musculoskeletal:as above Skin: Negative for rash. Neurological: Negative for headaches, focal weakness or numbness.   ____________________________________________   PHYSICAL EXAM:  VITAL SIGNS: ED Triage Vitals  Enc Vitals Group     BP 08/27/18 1521 133/72     Pulse Rate 08/27/18 1521 97     Resp 08/27/18 1521 18     Temp 08/27/18 1521 98.2 F (36.8 C)     Temp Source 08/27/18 1521 Oral     SpO2 08/27/18 1521 98 %     Weight 08/27/18 1524 213 lb (96.6 kg)     Height 08/27/18 1524 4\' 11"  (1.499 m)     Head Circumference --      Peak Flow --      Pain Score 08/27/18 1524 9     Pain Loc --  Pain Edu? --      Excl. in [place]? --     Constitutional: Alert and oriented. Well appearing and in no acute distress. Eyes: Conjunctivae are normal.  Head: Atraumatic. Nose: No congestion/rhinnorhea. Mouth/Throat: Mucous membranes are moist.  Neck: No stridor.   Cardiovascular: Normal rate, regular rhythm. Grossly normal heart sounds.  Good peripheral circulation with the blood bilateral radial as well as dorsalis his pulses. Chest pain is reproducible over the right pectoralis major muscle. Pain not reproducible over the thoracic back. No deformity or step-off. Respiratory: Normal respiratory effort.  No retractions. Lungs CTAB. Gastrointestinal: Soft and nontender. No distention. No CVA tenderness. Musculoskeletal: No lower extremity tenderness nor edema.  No joint effusions. Neurologic:  Normal speech and language. No gross focal neurologic deficits are appreciated. Skin:  Skin is warm, dry and intact. No rash  noted. Psychiatric: Mood and affect are normal. Speech and behavior are normal.  ____________________________________________   LABS (all labs ordered are listed, but only abnormal results are displayed)  Labs Reviewed  CBC - Abnormal; Notable for the following components:      Result Value   WBC 13.0 (*)    RBC 5.25 (*)    All other components within normal limits  COMPREHENSIVE METABOLIC PANEL - Abnormal; Notable for the following components:   Glucose, Bld 110 (*)    AST 52 (*)    ALT 105 (*)    All other components within normal limits  TROPONIN I  TROPONIN I   ____________________________________________  EKG  ED ECG REPORT I, Doran Stabler, the attending physician, personally viewed and interpreted this ECG.   Date: 08/27/2018  EKG Time: 1622  Rate: 111  Rhythm: normal sinus rhythm  Axis: Normal  Intervals:none  ST&T Change: no ST segment elevation or depression. No abnormal T-wave inversion.  ____________________________________________  RADIOLOGY  chest x-ray without acute disease ____________________________________________   PROCEDURES  Procedure(s) performed:   Procedures  Critical Care performed:   ____________________________________________   INITIAL IMPRESSION / ASSESSMENT AND PLAN / ED COURSE  Pertinent labs & imaging results that were available during my care of the patient were reviewed by me and considered in my medical decision making (see chart for details).  Differential diagnosis includes, but is not limited to, ACS, aortic dissection, pulmonary embolism, cardiac tamponade, pneumothorax, pneumonia, pericarditis, myocarditis, GI-related causes including esophagitis/gastritis, and musculoskeletal chest wall pain.   As part of my medical decision making, I reviewed the following data within the electronic MEDICAL RECORD NUMBER Notes from prior ED visits  ----------------------------------------- 8:30 PM on  08/27/2018 -----------------------------------------  Patient at this time says that her pain is down to 5 out of 10 after Toradol. Very reassuring cardiac workup. Patient with likely musculoskeletal pain as it is reproducible to palpation and worsened with movement of her right upper extremity. Patient already sees Dr. Nehemiah Massed for her known tachycardia. Not tachycardic during the visit today. She says that she'll be able to follow Dr. Nehemiah Massed within the next 2-3 days. Patient will try ibuprofen as well as topical salve such as Aspercreme or icy out at home. She understanding to return immediately for any worsening or concerning symptoms. ____________________________________________   FINAL CLINICAL IMPRESSION(S) / ED DIAGNOSES  chest pain.  NEW MEDICATIONS STARTED DURING THIS VISIT:  New Prescriptions   No medications on file     Note:  This document was prepared using Dragon voice recognition software and may include unintentional dictation errors.     Schaevitz, Randall An, MD  08/27/18 2031  

## 2018-08-27 NOTE — ED Notes (Signed)
RN called radiology to request update on pts chest Xray. Pt remains on the list to have test performed.

## 2018-08-27 NOTE — ED Triage Notes (Signed)
Chest pressure pain x 1 1/2 hour ago while at work. Slight SOB.

## 2018-10-25 ENCOUNTER — Encounter: Payer: Self-pay | Admitting: Obstetrics and Gynecology

## 2018-10-25 ENCOUNTER — Other Ambulatory Visit (HOSPITAL_COMMUNITY)
Admission: RE | Admit: 2018-10-25 | Discharge: 2018-10-25 | Disposition: A | Discharge status: Home / Self Care | Payer: Managed Care, Other (non HMO) | Source: Ambulatory Visit | Attending: Obstetrics and Gynecology | Admitting: Obstetrics and Gynecology

## 2018-10-25 ENCOUNTER — Ambulatory Visit (INDEPENDENT_AMBULATORY_CARE_PROVIDER_SITE_OTHER): Payer: Managed Care, Other (non HMO) | Admitting: Obstetrics and Gynecology

## 2018-10-25 VITALS — BP 122/70 | Ht 59.0 in | Wt 220.0 lb

## 2018-10-25 DIAGNOSIS — Z01419 Encounter for gynecological examination (general) (routine) without abnormal findings: Secondary | ICD-10-CM | POA: Insufficient documentation

## 2018-10-25 DIAGNOSIS — R87612 Low grade squamous intraepithelial lesion on cytologic smear of cervix (LGSIL): Secondary | ICD-10-CM

## 2018-10-25 DIAGNOSIS — Z124 Encounter for screening for malignant neoplasm of cervix: Secondary | ICD-10-CM | POA: Diagnosis present

## 2018-10-25 DIAGNOSIS — Z1339 Encounter for screening examination for other mental health and behavioral disorders: Secondary | ICD-10-CM

## 2018-10-25 DIAGNOSIS — Z1331 Encounter for screening for depression: Secondary | ICD-10-CM

## 2018-10-25 LAB — HM PAP SMEAR: HM Pap smear: NORMAL

## 2018-10-25 NOTE — Progress Notes (Signed)
Gynecology Annual Exam  PCP: Margo Common, PA  Chief Complaint  Patient presents with  . Annual Exam    History of Present Illness:  Ms. Kelly Matthews is a 52 y.o. [phone] who LMP was No LMP recorded. (Menstrual status: IUD)., presents today for her annual examination.  Her menses are absent due to her IUD  She does have vasomotor symptoms. She has hot flashes 3-4 times per week.   She is sexually active. She uses an IUD for contraception. She does not have vaginal dryness.  Last Pap: 1 year ago  Results were: low-grade squamous intraepithelial neoplasia (LGSIL - encompassing HPV,mild dysplasia,CIN I) / HPV DNA negative Hx of STDs: none  Last mammogram: 4 months  Results were: normal--routine follow-up in 12 months There is no FH of breast cancer. There is no FH of ovarian cancer. The patient does not do self-breast exams.  Colonoscopy: this year.  Polyps were found and removed.  She is supposed to have another colonoscopy in 6 months. DEXA: has not been screened for osteoporosis  Tobacco use: The patient denies current or previous tobacco use. Alcohol use: social drinker Exercise: not active  The patient wears seatbelts: yes.     Past Medical History:  Diagnosis Date  . 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 Diabetes mellitus without complication ([place])   . Fatty liver   . Hypertension   . Psoriasis (a type of skin inflammation)   . Tachycardia     Past Surgical History:  Procedure Laterality Date  . ANTERIOR CERVICAL DECOMP/DISCECTOMY FUSION N/A 02/01/2017   Procedure: ANTERIOR CERVICAL DECOMPRESSION/DISCECTOMY FUSION 1 LEVEL;  Surgeon: Bayard Hugger, MD;  Location: [facility] ORS;  Service: Neurosurgery;  Laterality: N/A;  . BREAST BIOPSY Left 04/18/2015   Benign calcification  . CESAREAN SECTION  1990,2001  . COLONOSCOPY WITH PROPOFOL N/A 06/15/2018   Procedure: COLONOSCOPY WITH PROPOFOL;  Surgeon: Virgel Manifold, MD;  Location: [facility] ENDOSCOPY;  Service:  Endoscopy;  Laterality: N/A;  . ESOPHAGOGASTRODUODENOSCOPY (EGD) WITH PROPOFOL N/A 06/15/2018   Procedure: ESOPHAGOGASTRODUODENOSCOPY (EGD) WITH PROPOFOL;  Surgeon: Virgel Manifold, MD;  Location: [facility] ENDOSCOPY;  Service: Endoscopy;  Laterality: N/A;  . INTRAUTERINE DEVICE INSERTION  October 2015   [facility]    Prior to Admission medications   Medication Sig Start Date End Date Taking? Authorizing Provider  aluminum-magnesium hydroxide-simethicone (MAALOX) 250-539-76 MG/5ML SUSP Take 30 mLs by mouth 4 (four) times daily -  before meals and at bedtime. 05/10/18  Yes Carrie Mew, MD  amitriptyline (ELAVIL) 10 MG tablet TAKE 1 TABLET BY MOUTH THREE TIMES A DAY 08/22/18  Yes Chrismon, Vickki Muff, PA  B-D INS SYR ULTRAFINE 1CC/31G 31G X 5/16" 1 ML MISC USE ONE SYRINGE FOR EACH DOSAGE (NOVOLOG THREE TIMES A DAY AND LANTUS ONCE A DAY) 12/10/16  Yes [provider]  clobetasol (TEMOVATE) 0.05 % external solution APPLY TWICE DAILY TO SCALP FOR [facility] 02/24/17  Yes [provider]  Clobetasol Propionate 0.05 % shampoo SHAMPOO DAILY. LEAVE IN PLACE FOR 30 MINUTES AND RINSE 02/24/17  Yes [provider]  Dapagliflozin-Metformin HCl ER 03-999 MG TB24 Take by mouth. 02/22/17  Yes [provider]  Diclofenac-miSOPROStol 75-0.2 MG TBEC TAKE 1 TABLET BY MOUTH TWICE A DAY AS NEEDED 08/18/18  Yes Chrismon, Vickki Muff, PA  diltiazem (DILACOR XR) 120 MG 24 hr capsule Take 120 mg by mouth every morning.  12/03/14  Yes [provider]  famotidine (PEPCID) 20 MG tablet Take 1 tablet (20  mg total) by mouth 2 (two) times daily. 05/10/18  Yes Carrie Mew, MD  fluticasone (FLONASE) 50 MCG/ACT nasal spray SPRAY 2 SPRAYS IN EACH NOSTRIL DAILY Patient taking differently: Place 1-2 sprays into both nostrils daily as needed for allergies.  07/26/15  Yes Chrismon, Vickki Muff, PA  glucose blood (ACCU-CHEK ACTIVE STRIPS) test strip Use as instructed 11/02/17  Yes Chrismon, Vickki Muff,  PA  Lancets (ACCU-CHEK SOFT TOUCH) lancets Use as instructed 11/02/17  Yes Chrismon, Vickki Muff, PA  methocarbamol (ROBAXIN) 500 MG tablet Take 1 tablet (500 mg total) by mouth 3 (three) times daily. 02/01/17  Yes Drinkwater, Fleeta Emmer, PA-C  montelukast (SINGULAIR) 10 MG tablet TAKE 1 TABLET BY MOUTH EVERY DAY 05/09/18  Yes Chrismon, Vickki Muff, PA  sitaGLIPtin (JANUVIA) 100 MG tablet Take by mouth. 06/01/17 06/15/18  [provider]    Allergies  Allergen Reactions  . Oxycodone Itching   Obstetric History: [id number], s/p c-section x 2  Family History  Problem Relation Age of Onset  . Coronary artery disease Mother   . Coronary artery disease Father   . Diabetes Father   . Emphysema Father   . Seizures Daughter   . Cancer Maternal Grandmother   . Breast cancer Neg Hx     Social History   Socioeconomic History  . Marital status: Widowed    Spouse name: Not on file  . Number of children: Not on file  . Years of education: Not on file  . Highest education level: Not on file  Occupational History  . Not on file  Social Needs  . Financial resource strain: Not on file  . Food insecurity:    Worry: Not on file    Inability: Not on file  . Transportation needs:    Medical: Not on file    Non-medical: Not on file  Tobacco Use  . Smoking status: Former Smoker    Years: 1.00    Last attempt to quit: 01/19/1985    Years since quitting: 33.7  . Smokeless tobacco: Never Used  Substance and Sexual Activity  . Alcohol use: No    Alcohol/week: 0.0 standard drinks    Comment: occasionally 1 beer every 6 months  . Drug use: No  . Sexual activity: Yes    Birth control/protection: IUD  Lifestyle  . Physical activity:    Days per week: Not on file    Minutes per session: Not on file  . Stress: Not on file  Relationships  . Social connections:    Talks on phone: Not on file    Gets together: Not on file    Attends religious service: Not on file    Active member of club or  organization: Not on file    Attends meetings of clubs or organizations: Not on file    Relationship status: Not on file  . Intimate partner violence:    Fear of current or ex partner: Not on file    Emotionally abused: Not on file    Physically abused: Not on file    Forced sexual activity: Not on file  Other Topics Concern  . Not on file  Social History Narrative  . Not on file   Review of Systems  Constitutional: Negative.   HENT: Negative.   Eyes: Negative.   Respiratory: Negative.   Cardiovascular: Negative.   Gastrointestinal: Negative.   Genitourinary: Negative.   Musculoskeletal: Negative.   Skin: Negative.   Neurological: Negative.   Psychiatric/Behavioral: Negative.  Physical Exam BP 122/70   Ht 4\' 11"  (1.499 m)   Wt 220 lb (99.8 kg)   BMI 44.43 kg/m   Physical Exam  Constitutional: She is oriented to person, place, and time. She appears well-developed and well-nourished. No distress.  Genitourinary: Uterus normal. Pelvic exam was performed with patient supine. There is no rash, tenderness, lesion or injury on the right labia. There is no rash, tenderness, lesion or injury on the left labia. No erythema, tenderness or bleeding in the vagina. No signs of injury around the vagina. No vaginal discharge found. Right adnexum does not display mass, does not display tenderness and does not display fullness. Left adnexum does not display mass, does not display tenderness and does not display fullness. Cervix does not exhibit motion tenderness, lesion, discharge or polyp.   Uterus is mobile. Uterus is not enlarged, tender or exhibiting a mass.  Genitourinary Comments: Cervix is nearly retropubic  HENT:  Head: Normocephalic and atraumatic.  Eyes: EOM are normal. No scleral icterus.  Neck: Normal range of motion. Neck supple. No thyromegaly present.  Cardiovascular: Normal rate and regular rhythm. Exam reveals no gallop and no friction rub.  No murmur  heard. Pulmonary/Chest: Effort normal and breath sounds normal. No respiratory distress. She has no wheezes. She has no rales. Right breast exhibits no inverted nipple, no mass, no nipple discharge, no skin change and no tenderness. Left breast exhibits no inverted nipple, no mass, no nipple discharge, no skin change and no tenderness.  Abdominal: Soft. Bowel sounds are normal. She exhibits no distension and no mass. There is no tenderness. There is no rebound and no guarding.  Musculoskeletal: Normal range of motion. She exhibits no edema or tenderness.  Lymphadenopathy:    She has no cervical adenopathy.       Right: No inguinal adenopathy present.       Left: No inguinal adenopathy present.  Neurological: She is alert and oriented to person, place, and time. No cranial nerve deficit.  Skin: Skin is warm and dry. No rash noted. No erythema.  Psychiatric: She has a normal mood and affect. Her behavior is normal. Judgment normal.   Female chaperone present for pelvic and breast  portions of the physical exam  Results: AUDIT Questionnaire (screen for alcoholism): 1 PHQ-9: 1  Assessment: 52 y.o. [id number] female here for routine gynecologic examination.  Plan: Problem List Items Addressed This Visit      Other   Women's annual routine gynecological examination - Primary    Other Visit Diagnoses    Screening for depression       Screening for alcoholism       Pap smear for cervical cancer screening       Low grade squamous intraepith lesion on cytologic smear cervix (lgsil)          Screening: -- Blood pressure screen managed by PCP -- Colonoscopy - per PCP -- Mammogram - not due -- Weight screening: obese: discussed management options, including lifestyle, dietary, and exercise. -- Depression screening negative (PHQ-9) -- Nutrition: normal -- cholesterol screening: per PCP -- osteoporosis screening: not due -- tobacco screening: not using -- alcohol screening: AUDIT  questionnaire indicates low-risk usage. -- family history of breast cancer screening: done. not at high risk. -- no evidence of domestic violence or intimate partner violence. -- STD screening: gonorrhea/chlamydia NAAT not collected per patient request. -- pap smear collected per ASCCP guidelines -- flu vaccine did not get this year yet.   Accepts today  and will receive.  -- HPV vaccination series: not eligilbe  Prentice Docker, MD 10/25/2018 1:49 PM

## 2018-10-28 ENCOUNTER — Encounter: Payer: Self-pay | Admitting: Physician Assistant

## 2018-10-28 ENCOUNTER — Ambulatory Visit: Payer: Managed Care, Other (non HMO) | Admitting: Physician Assistant

## 2018-10-28 VITALS — BP 127/85 | HR 82 | Temp 98.4°F | Resp 16 | Wt 219.9 lb

## 2018-10-28 DIAGNOSIS — N611 Abscess of the breast and nipple: Secondary | ICD-10-CM | POA: Diagnosis not present

## 2018-10-28 LAB — CYTOLOGY - PAP
DIAGNOSIS: NEGATIVE
HPV: NOT DETECTED

## 2018-10-28 MED ORDER — DOXYCYCLINE HYCLATE 100 MG PO TABS: 100.0000 mg | ORAL_TABLET | Freq: Two times a day (BID) | ORAL | 0 refills | Status: AC

## 2018-10-28 NOTE — Patient Instructions (Signed)
Skin Abscess A skin abscess is an infected area on or under your skin that contains pus and other material. An abscess can happen almost anywhere on your body. Some abscesses break open (rupture) on their own. Most continue to get worse unless they are treated. The infection can spread deeper into the body and into your blood, which can make you feel sick. Treatment usually involves draining the abscess. Follow these instructions at home: Abscess Care  If you have an abscess that has not drained, place a warm, clean, wet washcloth over the abscess several times a day. Do this as told by your doctor.  Follow instructions from your doctor about how to take care of your abscess. Make sure you: ? Cover the abscess with a bandage (dressing). ? Change your bandage or gauze as told by your doctor. ? Wash your hands with soap and water before you change the bandage or gauze. If you cannot use soap and water, use hand sanitizer.  Check your abscess every day for signs that the infection is getting worse. Check for: ? More redness, swelling, or pain. ? More fluid or blood. ? Warmth. ? More pus or a bad smell. Medicines   Take over-the-counter and prescription medicines only as told by your doctor.  If you were prescribed an antibiotic medicine, take it as told by your doctor. Do not stop taking the antibiotic even if you start to feel better. General instructions  To avoid spreading the infection: ? Do not share personal care items, towels, or hot tubs with others. ? Avoid making skin-to-skin contact with other people.  Keep all follow-up visits as told by your doctor. This is important. Contact a doctor if:  You have more redness, swelling, or pain around your abscess.  You have more fluid or blood coming from your abscess.  Your abscess feels warm when you touch it.  You have more pus or a bad smell coming from your abscess.  You have a fever.  Your muscles ache.  You have  chills.  You feel sick. Get help right away if:  You have very bad (severe) pain.  You see red streaks on your skin spreading away from the abscess. This information is not intended to replace advice given to you by your health care provider. Make sure you discuss any questions you have with your health care provider. Document Released: 04/20/2008 Document Revised: 06/28/2016 Document Reviewed: 09/11/2015 Elsevier Interactive Patient Education  2018 [employer]  

## 2018-10-28 NOTE — Progress Notes (Signed)
Patient: Kelly Matthews Female    DOB: 11/13/1966   52 y.o.   MRN: [id number] Visit Date: 10/28/2018  Today's Provider: Trinna Post, PA-C   Chief Complaint  Patient presents with  . Cyst   Subjective:     HPI  Patient here today with c/o possible left breast skin infection. She notice the cyst last night when she was undressing and noticed a pain under her left breast. She cleaned it with peroxide last night and neosporin. She reports that it feels hot to touch and is tender. She denies drainage from the area. She denies fevers, chills, nausea, vomiting.    Allergies  Allergen Reactions  . Oxycodone Itching     Current Outpatient Medications:  .  amitriptyline (ELAVIL) 10 MG tablet, TAKE 1 TABLET BY MOUTH THREE TIMES A DAY, Disp: 270 tablet, Rfl: 0 .  clobetasol (TEMOVATE) 0.05 % external solution, APPLY TWICE DAILY TO SCALP FOR ITCH, Disp: , Rfl: 4 .  Clobetasol Propionate 0.05 % shampoo, SHAMPOO DAILY. LEAVE IN PLACE FOR 30 MINUTES AND RINSE, Disp: , Rfl: 3 .  Dapagliflozin-Metformin HCl ER 03-999 MG TB24, Take by mouth., Disp: , Rfl:  .  Diclofenac-miSOPROStol 75-0.2 MG TBEC, TAKE 1 TABLET BY MOUTH TWICE A DAY AS NEEDED, Disp: 60 tablet, Rfl: 0 .  diltiazem (DILACOR XR) 120 MG 24 hr capsule, Take 120 mg by mouth every morning. , Disp: , Rfl:  .  famotidine (PEPCID) 20 MG tablet, Take 1 tablet (20 mg total) by mouth 2 (two) times daily., Disp: 60 tablet, Rfl: 0 .  fluticasone (FLONASE) 50 MCG/ACT nasal spray, SPRAY 2 SPRAYS IN EACH NOSTRIL DAILY (Patient taking differently: Place 1-2 sprays into both nostrils daily as needed for allergies. ), Disp: 16 g, Rfl: 5 .  glucose blood (ACCU-CHEK ACTIVE STRIPS) test strip, Use as instructed, Disp: 300 each, Rfl: 3 .  Lancets (ACCU-CHEK SOFT TOUCH) lancets, Use as instructed, Disp: 300 each, Rfl: 3 .  montelukast (SINGULAIR) 10 MG tablet, TAKE 1 TABLET BY MOUTH EVERY DAY, Disp: 90 tablet, Rfl: 1 .  sitaGLIPtin  (JANUVIA) 100 MG tablet, Take by mouth., Disp: , Rfl:  .  B-D INS SYR ULTRAFINE 1CC/31G 31G X 5/16" 1 ML MISC, USE ONE SYRINGE FOR EACH DOSAGE (NOVOLOG THREE TIMES A DAY AND LANTUS ONCE A DAY), Disp: , Rfl: 3 .  methocarbamol (ROBAXIN) 500 MG tablet, Take 1 tablet (500 mg total) by mouth 3 (three) times daily. (Patient not taking: Reported on 10/28/2018), Disp: 60 tablet, Rfl: 0  Review of Systems  Social History   Tobacco Use  . Smoking status: Former Smoker    Years: 1.00    Last attempt to quit: 01/19/1985    Years since quitting: 33.7  . Smokeless tobacco: Never Used  Substance Use Topics  . Alcohol use: No    Alcohol/week: 0.0 standard drinks    Comment: occasionally 1 beer every 6 months      Objective:   BP 127/85 (BP Location: Right Arm, Patient Position: Sitting, Cuff Size: Large)   Pulse 82   Temp 98.4 F (36.9 C) (Oral)   Resp 16   Wt 219 lb 14.4 oz (99.7 kg)   BMI 44.41 kg/m  Vitals:   10/28/18 0951  BP: 127/85  Pulse: 82  Resp: 16  Temp: 98.4 F (36.9 C)  TempSrc: Oral  Weight: 219 lb 14.4 oz (99.7 kg)     Physical Exam Constitutional:  General: She is not in acute distress.    Appearance: Normal appearance.  Chest:    Skin:    General: Skin is warm and dry.     Findings: Erythema and lesion present.  Neurological:     Mental Status: She is alert and oriented to person, place, and time. Mental status is at baseline.  Psychiatric:        Mood and Affect: Mood normal.        Behavior: Behavior normal.         Assessment & Plan    1. Breast abscess  Will treat as below. Counseled on warm compresses, may drain which is good and expected. Call back if worsening or if not completely resolved.   - doxycycline (VIBRA-TABS) 100 MG tablet; Take 1 tablet (100 mg total) by mouth 2 (two) times daily for 7 days.  Dispense: 14 tablet; Refill: 0  Return if symptoms worsen or fail to improve.  The entirety of the information documented in the  History of Present Illness, Review of Systems and Physical Exam were personally obtained by me. Portions of this information were initially documented by Lyndel Pleasure, CMA and reviewed by me for thoroughness and accuracy.       Trinna Post, PA-C  [facility]

## 2018-11-01 ENCOUNTER — Ambulatory Visit (INDEPENDENT_AMBULATORY_CARE_PROVIDER_SITE_OTHER): Payer: Managed Care, Other (non HMO) | Admitting: Physician Assistant

## 2018-11-01 ENCOUNTER — Encounter: Payer: Self-pay | Admitting: Physician Assistant

## 2018-11-01 VITALS — BP 128/82 | HR 87 | Temp 98.3°F | Resp 16 | Wt 219.4 lb

## 2018-11-01 DIAGNOSIS — S21002S Unspecified open wound of left breast, sequela: Secondary | ICD-10-CM

## 2018-11-01 MED ORDER — AMOXICILLIN-POT CLAVULANATE 875-125 MG PO TABS: 1.0000 | ORAL_TABLET | Freq: Two times a day (BID) | ORAL | 0 refills | Status: DC

## 2018-11-01 NOTE — Patient Instructions (Signed)
2:30 pm on 11/02/2018 at [place] with Dr. Dahlia Byes for left breast wound.

## 2018-11-01 NOTE — Progress Notes (Signed)
Patient: Kelly Matthews Female    DOB: [birth year]   52 y.o.   MRN: [id number] Visit Date: 11/02/2018  Today's Provider: Trinna Post, PA-C   Chief Complaint  Patient presents with  . Breast Abscess   Subjective:     HPI Patient coming in today with c/o breast abscess not better.patient reports is hurting and bleeding. Patient was seen in this clinic on 10/28/2018 and was started on 100 mg doxycycline BID. She is currently on her 4th day of doxycycline. She reports a significant amount of pus and blood drained from the area but that the lesion is more painful and red. She denies fevers, chills, nausea, and vomiting.   Allergies  Allergen Reactions  . Oxycodone Itching     Current Outpatient Medications:  .  amitriptyline (ELAVIL) 10 MG tablet, TAKE 1 TABLET BY MOUTH THREE TIMES A DAY, Disp: 270 tablet, Rfl: 0 .  clobetasol (TEMOVATE) 0.05 % external solution, APPLY TWICE DAILY TO SCALP FOR ITCH, Disp: , Rfl: 4 .  Clobetasol Propionate 0.05 % shampoo, SHAMPOO DAILY. LEAVE IN PLACE FOR 30 MINUTES AND RINSE, Disp: , Rfl: 3 .  Dapagliflozin-Metformin HCl ER 03-999 MG TB24, Take by mouth., Disp: , Rfl:  .  Diclofenac-miSOPROStol 75-0.2 MG TBEC, TAKE 1 TABLET BY MOUTH TWICE A DAY AS NEEDED, Disp: 60 tablet, Rfl: 0 .  diltiazem (DILACOR XR) 120 MG 24 hr capsule, Take 120 mg by mouth every morning. , Disp: , Rfl:  .  doxycycline (VIBRA-TABS) 100 MG tablet, Take 1 tablet (100 mg total) by mouth 2 (two) times daily for 7 days., Disp: 14 tablet, Rfl: 0 .  famotidine (PEPCID) 20 MG tablet, Take 1 tablet (20 mg total) by mouth 2 (two) times daily., Disp: 60 tablet, Rfl: 0 .  fluticasone (FLONASE) 50 MCG/ACT nasal spray, SPRAY 2 SPRAYS IN EACH NOSTRIL DAILY (Patient taking differently: Place 1-2 sprays into both nostrils daily as needed for allergies. ), Disp: 16 g, Rfl: 5 .  glucose blood (ACCU-CHEK ACTIVE STRIPS) test strip, Use as instructed, Disp: 300 each, Rfl: 3 .   Lancets (ACCU-CHEK SOFT TOUCH) lancets, Use as instructed, Disp: 300 each, Rfl: 3 .  montelukast (SINGULAIR) 10 MG tablet, TAKE 1 TABLET BY MOUTH EVERY DAY, Disp: 90 tablet, Rfl: 1 .  amoxicillin-clavulanate (AUGMENTIN) 875-125 MG tablet, Take 1 tablet by mouth 2 (two) times daily for 7 days., Disp: 14 tablet, Rfl: 0 .  B-D INS SYR ULTRAFINE 1CC/31G 31G X 5/16" 1 ML MISC, USE ONE SYRINGE FOR EACH DOSAGE (NOVOLOG THREE TIMES A DAY AND LANTUS ONCE A DAY), Disp: , Rfl: 3 .  methocarbamol (ROBAXIN) 500 MG tablet, Take 1 tablet (500 mg total) by mouth 3 (three) times daily. (Patient not taking: Reported on 10/28/2018), Disp: 60 tablet, Rfl: 0 .  sitaGLIPtin (JANUVIA) 100 MG tablet, Take by mouth., Disp: , Rfl:   Review of Systems  Social History   Tobacco Use  . Smoking status: Former Smoker    Years: 1.00    Last attempt to quit: 01/19/1985    Years since quitting: 33.8  . Smokeless tobacco: Never Used  Substance Use Topics  . Alcohol use: No    Alcohol/week: 0.0 standard drinks    Comment: occasionally 1 beer every 6 months      Objective:   BP 128/82 (BP Location: Left Arm, Patient Position: Sitting, Cuff Size: Large)   Pulse 87   Temp 98.3 F (36.8 C) (Oral)  Resp 16   Wt 219 lb 6.4 oz (99.5 kg)   BMI 44.31 kg/m  Vitals:   11/01/18 1049  BP: 128/82  Pulse: 87  Resp: 16  Temp: 98.3 F (36.8 C)  TempSrc: Oral  Weight: 219 lb 6.4 oz (99.5 kg)     Physical Exam Skin:    General: Skin is warm and dry.     Findings: Lesion present.     Comments: Left breast wound shows worsening erythema and tenderness. There is some purulence in the central aspect of the wound. No appreciable fluctuance.   Neurological:     Mental Status: She is oriented to person, place, and time. Mental status is at baseline.  Psychiatric:        Mood and Affect: Mood normal.        Behavior: Behavior normal.     Media Information   Document Information   Photos  Left breast wound     11/01/2018 11:00  Attached To:  Office Visit on 11/01/18 with Trinna Post, PA-C  Source Information   Paulene Floor  [place]    1. Breast wound, left, sequela  Wound swabbed today. Add augmentin for better strep coverage. Concerned this may need to be drained, will have her see surgery. Appointment tomorrow at 2:30 PM with Dr. Dahlia Byes.   - amoxicillin-clavulanate (AUGMENTIN) 875-125 MG tablet; Take 1 tablet by mouth 2 (two) times daily for 7 days.  Dispense: 14 tablet; Refill: 0 - Aerobic culture - Ambulatory referral to General Surgery  The entirety of the information documented in the History of Present Illness, Review of Systems and Physical Exam were personally obtained by me. Portions of this information were initially documented by Lyndel Pleasure, CMA and reviewed by me for thoroughness and accuracy.   Return if symptoms worsen or fail to improve.      Trinna Post, PA-C  [facility]

## 2018-11-02 ENCOUNTER — Ambulatory Visit (INDEPENDENT_AMBULATORY_CARE_PROVIDER_SITE_OTHER): Payer: Managed Care, Other (non HMO) | Admitting: Surgery

## 2018-11-02 ENCOUNTER — Other Ambulatory Visit: Payer: Self-pay

## 2018-11-02 ENCOUNTER — Encounter: Payer: Self-pay | Admitting: Surgery

## 2018-11-02 VITALS — BP 130/74 | HR 76 | Temp 97.8°F | Resp 13 | Ht 59.0 in | Wt <= 1120 oz

## 2018-11-02 DIAGNOSIS — N61 Mastitis without abscess: Secondary | ICD-10-CM | POA: Diagnosis not present

## 2018-11-02 NOTE — Patient Instructions (Signed)
Return in three to four weeks. The patient is aware to call back for any questions or concerns.

## 2018-11-03 ENCOUNTER — Encounter: Payer: Self-pay | Admitting: Surgery

## 2018-11-03 ENCOUNTER — Telehealth: Payer: Self-pay

## 2018-11-03 NOTE — Telephone Encounter (Signed)
Patient advised as directed below. Per patient she a little upset that nothing was done at the appointment. Per Fabio Bering continue with the Antibiotic.

## 2018-11-03 NOTE — Telephone Encounter (Signed)
-----   Message from Trinna Post, [place] sent at 11/03/2018 10:00 AM EST ----- Wound culture did not grow a bacteria. How did surgery appointment go?

## 2018-11-03 NOTE — Progress Notes (Signed)
Patient ID: Kelly Matthews, female   DOB: Oct 12, 1966, 52 y.o.   MRN: [id number]  HPI Kelly Matthews is a 52 y.o. female seen in consultation at the request of Mrs. Pollak PAC.  Over the last week or so she presented with an ulceration in the left breast with erythema.  Reported moderate pain that was sharp intermittent and worsening when wearing a bra.  Has been placed on antibiotics with appropriate response.  There is some minimal serous drainage.  No fevers no chills no weight loss.  Did have a recent mammogram on August 2019 that I have personally reviewed showing no evidence of suspicious lesions. No Family history of breast cancer. Cultures w no growth  HPI  Past Medical History:  Diagnosis Date  . 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 Diabetes mellitus without complication ([place])   . Fatty liver   . Hypertension   . Psoriasis (a type of skin inflammation)   . Tachycardia     Past Surgical History:  Procedure Laterality Date  . ANTERIOR CERVICAL DECOMP/DISCECTOMY FUSION N/A 02/01/2017   Procedure: ANTERIOR CERVICAL DECOMPRESSION/DISCECTOMY FUSION 1 LEVEL;  Surgeon: Bayard Hugger, MD;  Location: [facility] ORS;  Service: Neurosurgery;  Laterality: N/A;  . BREAST BIOPSY Left 04/18/2015   Benign calcification  . CESAREAN SECTION  1990,2001  . COLONOSCOPY WITH PROPOFOL N/A 06/15/2018   Procedure: COLONOSCOPY WITH PROPOFOL;  Surgeon: Virgel Manifold, MD;  Location: [facility] ENDOSCOPY;  Service: Endoscopy;  Laterality: N/A;  . ESOPHAGOGASTRODUODENOSCOPY (EGD) WITH PROPOFOL N/A 06/15/2018   Procedure: ESOPHAGOGASTRODUODENOSCOPY (EGD) WITH PROPOFOL;  Surgeon: Virgel Manifold, MD;  Location: [facility] ENDOSCOPY;  Service: Endoscopy;  Laterality: N/A;  . INTRAUTERINE DEVICE INSERTION  October 2015   [facility]    Family History  Problem Relation Age of Onset  . Coronary artery disease Mother   . Coronary artery disease Father   . Diabetes Father   . Emphysema Father   .  Seizures Daughter   . Cancer Maternal Grandmother   . Breast cancer Cousin 64    Social History Social History   Tobacco Use  . Smoking status: Former Smoker    Years: 1.00    Last attempt to quit: 01/19/1985    Years since quitting: 33.8  . Smokeless tobacco: Never Used  Substance Use Topics  . Alcohol use: No    Alcohol/week: 0.0 standard drinks    Comment: occasionally 1 beer every 6 months  . Drug use: No    Allergies  Allergen Reactions  . Oxycodone Itching    Current Outpatient Medications  Medication Sig Dispense Refill  . amitriptyline (ELAVIL) 10 MG tablet TAKE 1 TABLET BY MOUTH THREE TIMES A DAY 270 tablet 0  . amoxicillin-clavulanate (AUGMENTIN) 875-125 MG tablet Take 1 tablet by mouth 2 (two) times daily for 7 days. 14 tablet 0  . B-D INS SYR ULTRAFINE 1CC/31G 31G X 5/16" 1 ML MISC USE ONE SYRINGE FOR EACH DOSAGE (NOVOLOG THREE TIMES A DAY AND LANTUS ONCE A DAY)  3  . clobetasol (TEMOVATE) 0.05 % external solution APPLY TWICE DAILY TO SCALP FOR ITCH  4  . Clobetasol Propionate 0.05 % shampoo SHAMPOO DAILY. LEAVE IN PLACE FOR 30 MINUTES AND RINSE  3  . Dapagliflozin-Metformin HCl ER 03-999 MG TB24 Take by mouth.    . Diclofenac-miSOPROStol 75-0.2 MG TBEC TAKE 1 TABLET BY MOUTH TWICE A DAY AS NEEDED 60 tablet 0  . diltiazem (DILACOR XR) 120 MG 24 hr capsule Take 120 mg  by mouth every morning.     Marland Kitchen doxycycline (VIBRA-TABS) 100 MG tablet Take 1 tablet (100 mg total) by mouth 2 (two) times daily for 7 days. 14 tablet 0  . famotidine (PEPCID) 20 MG tablet Take 1 tablet (20 mg total) by mouth 2 (two) times daily. 60 tablet 0  . fluticasone (FLONASE) 50 MCG/ACT nasal spray SPRAY 2 SPRAYS IN EACH NOSTRIL DAILY (Patient taking differently: Place 1-2 sprays into both nostrils daily as needed for allergies. ) 16 g 5  . glucose blood (ACCU-CHEK ACTIVE STRIPS) test strip Use as instructed 300 each 3  . Lancets (ACCU-CHEK SOFT TOUCH) lancets Use as instructed 300 each 3  .  methocarbamol (ROBAXIN) 500 MG tablet Take 1 tablet (500 mg total) by mouth 3 (three) times daily. 60 tablet 0  . montelukast (SINGULAIR) 10 MG tablet TAKE 1 TABLET BY MOUTH EVERY DAY 90 tablet 1  . sitaGLIPtin (JANUVIA) 100 MG tablet Take by mouth.     No current facility-administered medications for this visit.      Review of Systems Full ROS  was asked and was negative except for the information on the HPI  Physical Exam Blood pressure 130/74, pulse 76, temperature 97.8 F (36.6 C), temperature source Skin, resp. rate 13, height 4\' 11"  (1.499 m), weight 22 lb (9.979 kg), SpO2 98 %. CONSTITUTIONAL: NAD, obese female EYES: Pupils are equal, round, and reactive to light, Sclera are non-icteric. EARS, NOSE, MOUTH AND THROAT: The oropharynx is clear. The oral mucosa is pink and moist. Hearing is intact to voice. LYMPH NODES:  Lymph nodes in the neck are normal. RESPIRATORY:  Lungs are clear. There is normal respiratory effort, with equal breath sounds bilaterally, and without pathologic use of accessory muscles. CARDIOVASCULAR: Heart is regular without murmurs, gallops, or rubs. BREAST: There is an ulceration on the skin of the left breast , this ulceration measures 1 x 1 cm and is very superficial.  There is no evidence of fluctuance, there is no evidence of crepitus or necrotizing soft tissue infection. GI: The abdomen is  soft, nontender, and nondistended. There are no palpable masses. There is no hepatosplenomegaly. There are normal bowel sounds in all quadrants. GU: Rectal deferred.   MUSCULOSKELETAL: Normal muscle strength and tone. No cyanosis or edema.   SKIN: Turgor is good and there are no pathologic skin lesions or ulcers. NEUROLOGIC: Motor and sensation is grossly normal. Cranial nerves are grossly intact. PSYCH:  Oriented to person, place and time. Affect is normal.  Data Reviewed  I have personally reviewed the patient's imaging, laboratory findings and medical records.     Assessment/Plan Breast cellulitis with small superficial ulceration.  Currently she is improving and responded to antibiotic therapy.  No need for emergent surgical intervention.  She may place daily bandaid. I would discourage any neosporin or ointment since this may contribute to some delay in healing. No evidence of abscess or necrotizing infection that would mandate operative intervention.  I will follow her in 2 weeks. Obviously if symptoms do not improve we may need to do a biopsy A Copy of this report will be sent to the referring provider   Caroleen Hamman, MD FACS General Surgeon 11/03/2018, 1:32 PM

## 2018-11-04 ENCOUNTER — Other Ambulatory Visit: Payer: Self-pay | Admitting: Family Medicine

## 2018-11-04 DIAGNOSIS — J4520 Mild intermittent asthma, uncomplicated: Secondary | ICD-10-CM

## 2018-11-04 LAB — AEROBIC CULTURE

## 2018-11-07 ENCOUNTER — Telehealth: Payer: Self-pay | Admitting: Physician Assistant

## 2018-11-07 DIAGNOSIS — S21002S Unspecified open wound of left breast, sequela: Secondary | ICD-10-CM

## 2018-11-07 MED ORDER — AMOXICILLIN-POT CLAVULANATE 875-125 MG PO TABS: 1.0000 | ORAL_TABLET | Freq: Two times a day (BID) | ORAL | 0 refills | Status: DC

## 2018-11-07 NOTE — Telephone Encounter (Signed)
Patient is finishing the Augmentin today. The wound under her arm is still open and draining pus but the redness is going away.  [facility] in [place]

## 2018-11-07 NOTE — Telephone Encounter (Signed)
Is the redness completely gone? Please send in augmentin 875-125 mg bid x 5 days for cellulitis. Please encourage her to take probiotics as this is a lot of antibiotics.

## 2018-11-07 NOTE — Telephone Encounter (Signed)
Patient was advised and states that the redness is almost completely gone. Medication was send in.

## 2018-11-10 ENCOUNTER — Telehealth: Payer: Self-pay | Admitting: Family Medicine

## 2018-11-10 NOTE — Telephone Encounter (Addendum)
Returned call to patient. Patient stated she is stopped taking antibiotic last night due to vomiting and diarrhea. Patient stated vomiting has eased off today but she is still having nausea. Patient stated wound is still open, red and oozing. Patient also stated she has symptoms of nausea, fever 101.00, and body aches. Per Dr. Caryn Section advised patient to hold Augmentin today and schedule patient for 11:20 am appt tomorrow for wound check.

## 2018-11-10 NOTE — Telephone Encounter (Signed)
Pt is taking antibiotic for wound on breast.  She started vomiting up and diarrhea this morning around 1 am. She wants to know what she needs to do about taking the antibiotic since she's vomiting?  Please advise.  Thanks, [employer]

## 2018-11-11 ENCOUNTER — Ambulatory Visit: Payer: Managed Care, Other (non HMO) | Admitting: Family Medicine

## 2018-11-11 ENCOUNTER — Encounter: Payer: Self-pay | Admitting: Family Medicine

## 2018-11-11 VITALS — BP 118/68 | HR 82 | Temp 98.1°F | Resp 16 | Wt 218.0 lb

## 2018-11-11 DIAGNOSIS — K529 Noninfective gastroenteritis and colitis, unspecified: Secondary | ICD-10-CM

## 2018-11-11 DIAGNOSIS — S21002S Unspecified open wound of left breast, sequela: Secondary | ICD-10-CM | POA: Diagnosis not present

## 2018-11-11 MED ORDER — CEPHALEXIN 500 MG PO CAPS: 500.0000 mg | ORAL_CAPSULE | Freq: Four times a day (QID) | ORAL | 0 refills | Status: AC

## 2018-11-11 NOTE — Progress Notes (Signed)
Patient: Kelly Matthews Female    DOB: 08/02/1966   52 y.o.   MRN: [id number] Visit Date: 11/11/2018  Today's Provider: Lelon Huh, MD   Chief Complaint  Patient presents with  . Wound Check   Subjective:     HPI  Patient comes in today for a wound check. She was seen in the office on 11/01/18 by Carles Collet, PA and was prescribed Augmentin. She reports that she has stopped taking the abx due to nausea, vomiting, and diarrhea which developed overnight on Christmas. She stopped augmenting yesterday. Nausea and diarrhea have improved, but not completely resolved. She did take Augmentin this morning which she felt she tolerated. She only has one more day of Augmenting left.   Allergies  Allergen Reactions  . Oxycodone Itching     Current Outpatient Medications:  .  amitriptyline (ELAVIL) 10 MG tablet, TAKE 1 TABLET BY MOUTH THREE TIMES A DAY, Disp: 270 tablet, Rfl: 0 .  amoxicillin-clavulanate (AUGMENTIN) 875-125 MG tablet, Take 1 tablet by mouth 2 (two) times daily for 5 days., Disp: 10 tablet, Rfl: 0 .  B-D INS SYR ULTRAFINE 1CC/31G 31G X 5/16" 1 ML MISC, USE ONE SYRINGE FOR EACH DOSAGE (NOVOLOG THREE TIMES A DAY AND LANTUS ONCE A DAY), Disp: , Rfl: 3 .  clobetasol (TEMOVATE) 0.05 % external solution, APPLY TWICE DAILY TO SCALP FOR ITCH, Disp: , Rfl: 4 .  Clobetasol Propionate 0.05 % shampoo, SHAMPOO DAILY. LEAVE IN PLACE FOR 30 MINUTES AND RINSE, Disp: , Rfl: 3 .  Dapagliflozin-Metformin HCl ER 03-999 MG TB24, Take by mouth., Disp: , Rfl:  .  Diclofenac-miSOPROStol 75-0.2 MG TBEC, TAKE 1 TABLET BY MOUTH TWICE A DAY AS NEEDED, Disp: 60 tablet, Rfl: 0 .  diltiazem (DILACOR XR) 120 MG 24 hr capsule, Take 120 mg by mouth every morning. , Disp: , Rfl:  .  famotidine (PEPCID) 20 MG tablet, Take 1 tablet (20 mg total) by mouth 2 (two) times daily., Disp: 60 tablet, Rfl: 0 .  fluticasone (FLONASE) 50 MCG/ACT nasal spray, SPRAY 2 SPRAYS IN EACH NOSTRIL DAILY (Patient  taking differently: Place 1-2 sprays into both nostrils daily as needed for allergies. ), Disp: 16 g, Rfl: 5 .  glucose blood (ACCU-CHEK ACTIVE STRIPS) test strip, Use as instructed, Disp: 300 each, Rfl: 3 .  Lancets (ACCU-CHEK SOFT TOUCH) lancets, Use as instructed, Disp: 300 each, Rfl: 3 .  methocarbamol (ROBAXIN) 500 MG tablet, Take 1 tablet (500 mg total) by mouth 3 (three) times daily., Disp: 60 tablet, Rfl: 0 .  montelukast (SINGULAIR) 10 MG tablet, TAKE 1 TABLET BY MOUTH EVERY DAY, Disp: 90 tablet, Rfl: 1 .  sitaGLIPtin (JANUVIA) 100 MG tablet, Take by mouth., Disp: , Rfl:   Review of Systems  Constitutional: Positive for fatigue and unexpected weight change. Negative for activity change, appetite change, chills and fever.  Respiratory: Negative for cough and shortness of breath.   Cardiovascular: Negative for chest pain, palpitations and leg swelling.  Gastrointestinal: Positive for nausea.  Skin: Positive for wound. Negative for color change, pallor and rash.  Neurological: Negative for dizziness, light-headedness and headaches.    Social History   Tobacco Use  . Smoking status: Former Smoker    Years: 1.00    Last attempt to quit: 01/19/1985    Years since quitting: 33.8  . Smokeless tobacco: Never Used  Substance Use Topics  . Alcohol use: No    Alcohol/week: 0.0 standard drinks  Comment: occasionally 1 beer every 6 months      Objective:   BP 118/68 (BP Location: Left Arm, Patient Position: Sitting, Cuff Size: Large)   Pulse 82   Temp 98.1 F (36.7 C)   Resp 16   Wt 218 lb (98.9 kg)   SpO2 98%   BMI 44.03 kg/m  Vitals:   11/11/18 1115  BP: 118/68  Pulse: 82  Resp: 16  Temp: 98.1 F (36.7 C)  SpO2: 98%  Weight: 218 lb (98.9 kg)     Physical Exam  Skin: <1cm area of erythema with no discharge, significantly improved from photo previous exam from 11-01-2018    Assessment & Plan    1. Gastroenteritis Likely viral improving. She did have some  diarrhea when she first started Augmentin. Has nearly finished antibiotic but infection not quite resolved. Will change to cephalexin as below to minimize GI side effects.   2. Breast wound, left, sequela Change Augmentin to- cephALEXin (KEFLEX) 500 MG capsule; Take 1 capsule (500 mg total) by mouth 4 (four) times daily for 7 days.  Dispense: 28 capsule; Refill: 0     Lelon Huh, MD  [facility]

## 2018-11-11 NOTE — Patient Instructions (Signed)
.   Please bring all of your medications to every appointment so we can make sure that our medication list is the same as yours.   

## 2018-11-18 ENCOUNTER — Other Ambulatory Visit: Payer: Self-pay | Admitting: Family Medicine

## 2018-11-18 ENCOUNTER — Telehealth: Payer: Self-pay

## 2018-11-18 ENCOUNTER — Telehealth: Payer: Self-pay | Admitting: Family Medicine

## 2018-11-18 DIAGNOSIS — J309 Allergic rhinitis, unspecified: Secondary | ICD-10-CM

## 2018-11-18 MED ORDER — FLUTICASONE PROPIONATE 50 MCG/ACT NA SUSP: NASAL | 5 refills | Status: DC

## 2018-11-18 MED ORDER — MOMETASONE FUROATE 50 MCG/ACT NA SUSP: NASAL | 3 refills | Status: DC

## 2018-11-18 NOTE — Telephone Encounter (Signed)
May change to Nasonex 2 sprays each nostril at bedtime 1 inhaler with 3 RF. Not sure if insurance will cover any of the nasal sprays that are available over-the-counter now.

## 2018-11-18 NOTE — Telephone Encounter (Signed)
Please advised.  Pt has been on Augmentin and Keflex in the last two weeks for a Breast Abscess.    Thanks,   -Mickel Baas

## 2018-11-18 NOTE — Telephone Encounter (Signed)
Please advise 

## 2018-11-18 NOTE — Telephone Encounter (Signed)
Done

## 2018-11-18 NOTE — Telephone Encounter (Signed)
Needing to talk to Dr. Caryn Section nurse because pt is now starting with a sinus infection - upper respiratory with headache - sore throat - sneezing.  Pt needing to know what she needs to do now.  Please advise.  Thanks, [employer]

## 2018-11-18 NOTE — Telephone Encounter (Signed)
Patient reports that her insurance will not cover fluticasone spray and is requesting that [facility]

## 2018-11-18 NOTE — Telephone Encounter (Signed)
Patient advised as below. Refill for fluticasone sent into the pharmacy.

## 2018-11-18 NOTE — Telephone Encounter (Signed)
Its probably viral. Continue cephalexin. Use nasal saline or netti-pot 3-4 times a day. Use fluticasone nasal spray daily, can send in refill if she is out. O.v. with Simona Huh if not better in 4-5 days.

## 2018-11-27 ENCOUNTER — Other Ambulatory Visit: Payer: Self-pay | Admitting: Family Medicine

## 2018-11-30 ENCOUNTER — Other Ambulatory Visit: Payer: Self-pay

## 2018-11-30 ENCOUNTER — Ambulatory Visit (INDEPENDENT_AMBULATORY_CARE_PROVIDER_SITE_OTHER): Payer: 59 | Admitting: Surgery

## 2018-11-30 ENCOUNTER — Encounter: Payer: Self-pay | Admitting: Surgery

## 2018-11-30 VITALS — BP 119/85 | HR 87 | Temp 97.2°F | Ht 59.0 in | Wt 215.2 lb

## 2018-11-30 DIAGNOSIS — N61 Mastitis without abscess: Secondary | ICD-10-CM

## 2018-11-30 NOTE — Patient Instructions (Addendum)
Patient is to return to the office as needed.  Call the office with any questions or concerns. 

## 2018-12-01 ENCOUNTER — Encounter: Payer: Self-pay | Admitting: Surgery

## 2018-12-01 NOTE — Progress Notes (Signed)
Outpatient Surgical Follow Up  12/01/2018  Kelly Matthews is an 53 y.o. female.   Chief Complaint  Patient presents with  . Follow-up     4 week f/u cellulitis breast     HPI: she is f/u cellulitis left breast, responded to antibiotics and wound has healed. She is concerned about discoloration. No fevers, chill, pain or discharge.  Past Medical History:  Diagnosis Date  . 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 Diabetes mellitus without complication ([place])   . Fatty liver   . Hypertension   . Psoriasis (a type of skin inflammation)   . Tachycardia     Past Surgical History:  Procedure Laterality Date  . ANTERIOR CERVICAL DECOMP/DISCECTOMY FUSION N/A 02/01/2017   Procedure: ANTERIOR CERVICAL DECOMPRESSION/DISCECTOMY FUSION 1 LEVEL;  Surgeon: Bayard Hugger, MD;  Location: [facility];  Service: Neurosurgery;  Laterality: N/A;  . BREAST BIOPSY Left 04/18/2015   Benign calcification  . CESAREAN SECTION  1990,2001  . COLONOSCOPY WITH PROPOFOL N/A 06/15/2018   Procedure: COLONOSCOPY WITH PROPOFOL;  Surgeon: Virgel Manifold, MD;  Location: [facility] END[facility];  Service: Endoscopy;  Laterality: N/A;  . ESOPHAGOGASTRODUODENOSCOPY (EGD) WITH PROPOFOL N/A 06/15/2018   Procedure: ESOPHAGOGASTRODUODENOSCOPY (EGD) WITH PROPOFOL;  Surgeon: Virgel Manifold, MD;  Location: [facility];  Service: Endoscopy;  Laterality: N/A;  . INTRAUTERINE DEVICE INSERTION  October 2015   [facility]    Family History  Problem Relation Age of Onset  . Coronary artery disease Mother   . Coronary artery disease Father   . Diabetes Father   . Emphysema Father   . Seizures Daughter   . Cancer Maternal Grandmother   . Breast cancer Cousin 60    Social History:  reports that she quit smoking about 33 years ago. She quit after 1.00 year of use. She has never used smokeless tobacco. She reports that she does not drink alcohol or use drugs.  Allergies:  Allergies  Allergen Reactions  . Oxycodone  Itching    Medications reviewed.    ROS Full ROS performed and is otherwise negative other than what is stated in HPI   BP 119/85   Pulse 87   Temp (!) 97.2 F (36.2 C) (Temporal)   Ht 4\' 11"  (1.499 m)   Wt 215 lb 3.2 oz (97.6 kg)   SpO2 97%   BMI 43.47 kg/m   Physical Exam  NAD, alert Breast: completely healed left wound, mild darker discoloration. No masses Abd: soft, nt, no masses Ext: no edema and well perfused   Assessment/Plan: Resolved cellulitis and wound on the left breast. Continue routine mammogram. No surgical intervention. D/W the pt in detail. She is concerned about color, this should improve with time but there will be a scar. She understands.    Greater than 50% of the 15 minutes  visit was spent in counseling/coordination of care   Caroleen Hamman, MD [facility]

## 2019-03-13 ENCOUNTER — Ambulatory Visit (INDEPENDENT_AMBULATORY_CARE_PROVIDER_SITE_OTHER): Payer: 59 | Admitting: Family Medicine

## 2019-03-13 ENCOUNTER — Other Ambulatory Visit: Payer: Self-pay

## 2019-03-13 ENCOUNTER — Encounter: Payer: Self-pay | Admitting: Family Medicine

## 2019-03-13 VITALS — BP 141/87 | HR 90 | Temp 98.4°F

## 2019-03-13 DIAGNOSIS — L409 Psoriasis, unspecified: Secondary | ICD-10-CM | POA: Diagnosis not present

## 2019-03-13 DIAGNOSIS — J0140 Acute pansinusitis, unspecified: Secondary | ICD-10-CM | POA: Diagnosis not present

## 2019-03-13 DIAGNOSIS — E1169 Type 2 diabetes mellitus with other specified complication: Secondary | ICD-10-CM

## 2019-03-13 MED ORDER — AZITHROMYCIN 250 MG PO TABS: ORAL_TABLET | ORAL | 0 refills | Status: DC

## 2019-03-13 NOTE — Progress Notes (Signed)
Kelly Matthews  MRN: [id number] DOB: Sep 20, 1966  Subjective:  HPI   Virtual Visit via Video Note  I connected with Kelly Matthews on 03/13/19 at  2:00 PM EDT by a video enabled telemedicine application and verified that I am speaking with the correct person using two identifiers.   I discussed the limitations of evaluation and management by telemedicine and the availability of in person appointments. The patient expressed understanding and agreed to proceed.    The patient is a 53 year old female who presents through electronic device.  She complains of sore throat that started Saturday.  In the evening it developed into headache, sinus pain and runny nose. Sunday morning she spoke with the [facility]  Through her insurance and she was told she probably did not need an antibiotic.  Then, on Sunday night she started coughing and coughing up green phlegm.  She is unable to sleep due to the cough.  She has been taking an OTC sinus pain and headache medicine.     Patient Active Problem List   Diagnosis Date Noted  . Stomach irritation   . Abdominal pain, epigastric   . Special screening for malignant neoplasms, colon   . Polyp of sigmoid colon   . Right leg pain 02/24/2018  . Women's annual routine gynecological examination 05/14/2017  . Malnutrition of moderate degree 12/07/2016  . DKA (diabetic ketoacidoses) ([place]) 12/05/2016  . Migraine headache 05/05/2016  . Allergic rhinitis 07/23/2015  . Excess, menstruation 07/19/2015  . 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 Blood glucose elevated 07/19/2015  . Airway hyperreactivity 07/19/2015  . Disorder of esophagus 07/19/2015  . Hypercholesterolemia without hypertriglyceridemia 07/19/2015  . Borderline diabetes 07/19/2015  . Diabetes ([place]) 11/28/2014  . Essential (primary) hypertension 11/28/2014  . Aortic heart valve narrowing 11/28/2014  . 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  Supraventricular tachycardia ([place]) 11/28/2014  . 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 Diabetes mellitus without complication ([place])   . Fatty liver   . Hypertension   . Psoriasis (a type of skin inflammation)   . Tachycardia     Social History   Socioeconomic History  . Marital status: Widowed    Spouse name: Not on file  . Number of children: Not on file  . Years of education: Not on file  . Highest education level: Not on file  Occupational History  . Not on file  Social Needs  . Financial resource strain: Not on file  . Food insecurity:    Worry: Not on file    Inability: Not on file  . Transportation needs:    Medical: Not on file    Non-medical: Not on file  Tobacco Use  . Smoking status: Former Smoker    Years: 1.00    Last attempt to quit: 01/19/1985    Years since quitting: 34.1  . Smokeless tobacco: Never Used  Substance and Sexual Activity  . Alcohol use: No    Alcohol/week: 0.0 standard drinks    Comment: occasionally 1 beer every 6 months  . Drug use: No  . Sexual activity: Yes    Birth control/protection: I.U.D.  Lifestyle  . Physical activity:    Days per week: Not on file    Minutes per session: Not on file  . Stress: Not on file  Relationships  . Social connections:    Talks on  phone: Not on file    Gets together: Not on file    Attends religious service: Not on file    Active member of club or organization: Not on file    Attends meetings of clubs or organizations: Not on file    Relationship status: Not on file  . Intimate partner violence:    Fear of current or ex partner: Not on file    Emotionally abused: Not on file    Physically abused: Not on file    Forced sexual activity: Not on file  Other Topics Concern  . Not on file  Social History Narrative  . Not on file   Past Surgical History:  Procedure Laterality Date  . ANTERIOR CERVICAL  DECOMP/DISCECTOMY FUSION N/A 02/01/2017   Procedure: ANTERIOR CERVICAL DECOMPRESSION/DISCECTOMY FUSION 1 LEVEL;  Surgeon: Bayard Hugger, MD;  Location: [facility];  Service: Neurosurgery;  Laterality: N/A;  . BREAST BIOPSY Left 04/18/2015   Benign calcification  . CESAREAN SECTION  1990,2001  . COLONOSCOPY WITH PROPOFOL N/A 06/15/2018   Procedure: COLONOSCOPY WITH PROPOFOL;  Surgeon: Virgel Manifold, MD;  Location: [facility];  Service: Endoscopy;  Laterality: N/A;  . ESOPHAGOGASTRODUODENOSCOPY (EGD) WITH PROPOFOL N/A 06/15/2018   Procedure: ESOPHAGOGASTRODUODENOSCOPY (EGD) WITH PROPOFOL;  Surgeon: Virgel Manifold, MD;  Location: [facility];  Service: Endoscopy;  Laterality: N/A;  . INTRAUTERINE DEVICE INSERTION  October 2015   [facility]   Family History  Problem Relation Age of Onset  . Coronary artery disease Mother   . Coronary artery disease Father   . Diabetes Father   . Emphysema Father   . Seizures Daughter   . Cancer Maternal Grandmother   . Breast cancer Cousin 21   Outpatient Encounter Medications as of 03/13/2019  Medication Sig  . B-D INS SYR ULTRAFINE 1CC/31G 31G X 5/16" 1 ML MISC USE ONE SYRINGE FOR EACH DOSAGE (NOVOLOG THREE TIMES A DAY AND LANTUS ONCE A DAY)  . clobetasol (TEMOVATE) 0.05 % external solution APPLY TWICE DAILY TO SCALP FOR ITCH  . Clobetasol Propionate 0.05 % shampoo SHAMPOO DAILY. LEAVE IN PLACE FOR 30 MINUTES AND RINSE  . Dapagliflozin-Metformin HCl ER 03-999 MG TB24 Take by mouth.  . diltiazem (DILACOR XR) 120 MG 24 hr capsule Take 120 mg by mouth every morning.   . fluticasone (FLONASE) 50 MCG/ACT nasal spray SPRAY 2 SPRAYS IN EACH NOSTRIL DAILY  . glucose blood (ACCU-CHEK ACTIVE STRIPS) test strip Use as instructed  . Ixekizumab (TALTZ [place]) Inject into the skin.  . Lancets (ACCU-CHEK SOFT TOUCH) lancets Use as instructed  . montelukast (SINGULAIR) 10 MG tablet TAKE 1 TABLET BY MOUTH EVERY DAY  . sitaGLIPtin (JANUVIA) 100 MG tablet  Take by mouth.  Marland Kitchen amitriptyline (ELAVIL) 10 MG tablet TAKE 1 TABLET BY MOUTH THREE TIMES A DAY (Patient not taking: Reported on 03/13/2019)  . Diclofenac-miSOPROStol 75-0.2 MG TBEC TAKE 1 TABLET BY MOUTH TWICE A DAY AS NEEDED (Patient not taking: Reported on 03/13/2019)  . famotidine (PEPCID) 20 MG tablet Take 1 tablet (20 mg total) by mouth 2 (two) times daily. (Patient not taking: Reported on 03/13/2019)  . methocarbamol (ROBAXIN) 500 MG tablet Take 1 tablet (500 mg total) by mouth 3 (three) times daily. (Patient not taking: Reported on 03/13/2019)  . mometasone (NASONEX) 50 MCG/ACT nasal spray 2 Sprays in each nostril at bedtime (Patient not taking: Reported on 03/13/2019)   No facility-administered encounter medications on file as of 03/13/2019.     Allergies  Allergen  Reactions  . Oxycodone Itching   Review of Systems  Constitutional: Negative for chills and fever.  HENT: Positive for congestion and sore throat.   Eyes: Negative.   Respiratory: Positive for cough. Negative for shortness of breath and wheezing.   Cardiovascular: Negative.   Gastrointestinal: Negative.     Objective:  BP 141/87, Pulse 90, Temp 98.4  WDWN female in no apparent distress.  Head: Normocephalic, atraumatic. Neck: Supple, NROM Respiratory: No apparent distress Psych: Normal mood and affect Throat: Slight erythema to the soft palate area.  Assessment and Plan :  1. Subacute pansinusitis Developed sore throat with frontal, ethmoid and maxillary sinus pressure pain on 03-11-19. Some sneezing, rhinorrhea and PND with greenish sputum this morning with a cough. No fever or shortness of breath. Goody's Powder has helped sinus pressure pain. May continue saltwater gargles with Flonase Nasal Spray. Add Z-pak and increase fluid intake. Advised to stay home until free of fever for 72 hours. Continue COVID-19 precautions of isolation, face covering, frequent hand washing and social distancing. Recheck virtual video visit  if no better in 3 days. - azithromycin (ZITHROMAX) 250 MG tablet; Take 2 tablets by mouth today then one tablet daily for 4 days.  Dispense: 6 tablet; Refill: 0  2. Type 2 diabetes mellitus with other specified complication, without long-term current use of insulin (HCC) States FBS in the 90-100's range recently and continues follow up with Dr. Gabriel Carina (endocrinologist) regularly. Presently on Xigduo 03-999 mg 2 tablets qd with Januvia 100 mg qd. Hgb A1C was 5.8% on 01-16-19.  3. Psoriasis Has been on Taltz injection started 3 weeks ago by Dr. Phillip Heal (dermatologist). Psoriasis is starting to improve.   I discussed the assessment and treatment plan with the patient. The patient was provided an opportunity to ask questions and all were answered. The patient agreed with the plan and demonstrated an understanding of the instructions.   The patient was advised to call back or seek an in-person evaluation if the symptoms worsen or if the condition fails to improve as anticipated.  I provided 12 minutes of non-face-to-face time during this encounter.

## 2019-03-16 ENCOUNTER — Other Ambulatory Visit: Payer: Self-pay

## 2019-03-16 ENCOUNTER — Emergency Department: Payer: 59

## 2019-03-16 ENCOUNTER — Emergency Department
Admission: EM | Admit: 2019-03-16 | Discharge: 2019-03-16 | Disposition: A | Discharge status: Home / Self Care | Payer: 59 | Attending: Emergency Medicine | Admitting: Emergency Medicine

## 2019-03-16 ENCOUNTER — Telehealth: Payer: Self-pay

## 2019-03-16 DIAGNOSIS — B9789 Other viral agents as the cause of diseases classified elsewhere: Secondary | ICD-10-CM

## 2019-03-16 DIAGNOSIS — Z87891 Personal history of nicotine dependence: Secondary | ICD-10-CM | POA: Insufficient documentation

## 2019-03-16 DIAGNOSIS — Z20828 Contact with and (suspected) exposure to other viral communicable diseases: Secondary | ICD-10-CM | POA: Diagnosis not present

## 2019-03-16 DIAGNOSIS — E119 Type 2 diabetes mellitus without complications: Secondary | ICD-10-CM | POA: Insufficient documentation

## 2019-03-16 DIAGNOSIS — R509 Fever, unspecified: Secondary | ICD-10-CM | POA: Diagnosis present

## 2019-03-16 DIAGNOSIS — I10 Essential (primary) hypertension: Secondary | ICD-10-CM | POA: Insufficient documentation

## 2019-03-16 DIAGNOSIS — J069 Acute upper respiratory infection, unspecified: Secondary | ICD-10-CM

## 2019-03-16 LAB — CBC WITH DIFFERENTIAL/PLATELET
Abs Immature Granulocytes: 0.04 10*3/uL (ref 0.00–0.07)
Basophils Absolute: 0.1 10*3/uL (ref 0.0–0.1)
Basophils Relative: 1 %
Eosinophils Absolute: 0.1 10*3/uL (ref 0.0–0.5)
Eosinophils Relative: 1 %
HCT: 45.2 % (ref 36.0–46.0)
Hemoglobin: 15.1 g/dL — ABNORMAL HIGH (ref 12.0–15.0)
Immature Granulocytes: 0 %
Lymphocytes Relative: 20 %
Lymphs Abs: 1.9 10*3/uL (ref 0.7–4.0)
MCH: 28 pg (ref 26.0–34.0)
MCHC: 33.4 g/dL (ref 30.0–36.0)
MCV: 83.7 fL (ref 80.0–100.0)
Monocytes Absolute: 0.5 10*3/uL (ref 0.1–1.0)
Monocytes Relative: 6 %
Neutro Abs: 6.9 10*3/uL (ref 1.7–7.7)
Neutrophils Relative %: 72 %
Platelets: 200 10*3/uL (ref 150–400)
RBC: 5.4 MIL/uL — ABNORMAL HIGH (ref 3.87–5.11)
RDW: 13.6 % (ref 11.5–15.5)
WBC: 9.5 10*3/uL (ref 4.0–10.5)
nRBC: 0 % (ref 0.0–0.2)

## 2019-03-16 LAB — COMPREHENSIVE METABOLIC PANEL
ALT: 71 U/L — ABNORMAL HIGH (ref 0–44)
AST: 37 U/L (ref 15–41)
Albumin: 4.4 g/dL (ref 3.5–5.0)
Alkaline Phosphatase: 82 U/L (ref 38–126)
Anion gap: 10 (ref 5–15)
BUN: 13 mg/dL (ref 6–20)
CO2: 24 mmol/L (ref 22–32)
Calcium: 9.8 mg/dL (ref 8.9–10.3)
Chloride: 103 mmol/L (ref 98–111)
Creatinine, Ser: 0.43 mg/dL — ABNORMAL LOW (ref 0.44–1.00)
GFR calc Af Amer: >60 mL/min (ref >60)
GFR calc non Af Amer: >60 mL/min (ref >60)
Glucose, Bld: 110 mg/dL — ABNORMAL HIGH (ref 70–99)
Potassium: 4.1 mmol/L (ref 3.5–5.1)
Sodium: 137 mmol/L (ref 135–145)
Total Bilirubin: 0.5 mg/dL (ref 0.3–1.2)
Total Protein: 7.8 g/dL (ref 6.5–8.1)

## 2019-03-16 LAB — ABO/RH: ABO/RH(D): O POS

## 2019-03-16 NOTE — Telephone Encounter (Signed)
Patient states she had fever up to 100.6 with chills 2 days ago. Temp back down to 99 today but having more cough, shortness of breath, fatigue and malaise. Started the Z-pak on 03-13-19 and using OTC cough and congestion meds. Should continue increase in fluids, isolate at home and call report of symptoms with temperature reading tomorrow. If shortness of breath and fever goes back up, she should go to ER. Patient states mother (lives in the same household) was hospitalized a couple weeks ago with diagnosis of CHF (her mother's COVID-19 test was negative at that time). Son has had some flare of allergies with sore throat the past week but much better and no fever.

## 2019-03-16 NOTE — ED Provider Notes (Signed)
[facility] Emergency Department Provider Note  ____________________________________________  Time seen: Approximately 6:50 PM  I have reviewed the triage vital signs and the nursing notes.   HISTORY  Chief Complaint Cough   HPI Kelly Matthews is a 53 y.o. female with history of psoriasis on Izekizumab who presents for evaluation of fever. Patient reports fever, cough productive of green sputum, sore throat, congestion, sneezing, body aches, and runny nose that started 4 days ago. Tmax 100.84F at home.  Started on Z-Pak 3 days ago.  Has not had a fever in the last 24 hours but continues to feel that her symptoms are getting worse.  She denies chest pain or shortness of breath.  Abdominal pain, vomiting, diarrhea, dysuria or hematuria.  Patient works at [employer]  No known exposure to COVID or travel to endemic regions.  Past Medical History:  Diagnosis Date  . 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 Diabetes mellitus without complication ([place])   . Fatty liver   . Hypertension   . Psoriasis (a type of skin inflammation)   . Tachycardia     Patient Active Problem List   Diagnosis Date Noted  . Psoriasis 03/13/2019  . Stomach irritation   . Abdominal pain, epigastric   . Special screening for malignant neoplasms, colon   . Polyp of sigmoid colon   . Right leg pain 02/24/2018  . Women's annual routine gynecological examination 05/14/2017  . Malnutrition of moderate degree 12/07/2016  . DKA (diabetic ketoacidoses) ([place]) 12/05/2016  . Migraine headache 05/05/2016  . Allergic rhinitis 07/23/2015  . Excess, menstruation 07/19/2015  . 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 Blood glucose elevated 07/19/2015  . Airway hyperreactivity 07/19/2015  . Disorder of esophagus 07/19/2015  . Hypercholesterolemia without hypertriglyceridemia 07/19/2015  . Borderline diabetes 07/19/2015  . Diabetes ([place]) 11/28/2014  . Essential (primary) hypertension  11/28/2014  . Aortic heart valve narrowing 11/28/2014  . 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 Supraventricular tachycardia ([place]) 11/28/2014  . 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 ANTERIOR CERVICAL DECOMP/DISCECTOMY FUSION N/A 02/01/2017   Procedure: ANTERIOR CERVICAL DECOMPRESSION/DISCECTOMY FUSION 1 LEVEL;  Surgeon: Bayard Hugger, MD;  Location: [facility];  Service: Neurosurgery;  Laterality: N/A;  . BREAST BIOPSY Left 04/18/2015   Benign calcification  . CESAREAN SECTION  1990,2001  . COLONOSCOPY WITH PROPOFOL N/A 06/15/2018   Procedure: COLONOSCOPY WITH PROPOFOL;  Surgeon: Virgel Manifold, MD;  Location: [facility];  Service: Endoscopy;  Laterality: N/A;  . ESOPHAGOGASTRODUODENOSCOPY (EGD) WITH PROPOFOL N/A 06/15/2018   Procedure: ESOPHAGOGASTRODUODENOSCOPY (EGD) WITH PROPOFOL;  Surgeon: Virgel Manifold, MD;  Location: [facility];  Service: Endoscopy;  Laterality: N/A;  . INTRAUTERINE DEVICE INSERTION  October 2015   [facility]    Prior to Admission medications   Medication Sig Start Date End Date Taking? Authorizing Provider  amitriptyline (ELAVIL) 10 MG tablet TAKE 1 TABLET BY MOUTH THREE TIMES A DAY Patient not taking: Reported on 03/13/2019 11/28/18   Chrismon, Vickki Muff, PA  azithromycin (ZITHROMAX) 250 MG tablet Take 2 tablets by mouth today then one tablet daily for 4 days. 03/13/19   Chrismon, Vickki Muff, PA  B-D INS SYR ULTRAFINE 1CC/31G 31G X 5/16" 1 ML MISC USE ONE SYRINGE FOR EACH DOSAGE (NOVOLOG THREE TIMES A DAY AND LANTUS ONCE A DAY) 12/10/16   [provider]  clobetasol (TEMOVATE) 0.05 % external solution APPLY TWICE DAILY  TO SCALP FOR [facility] 02/24/17   [provider]  Clobetasol Propionate 0.05 % shampoo SHAMPOO DAILY. LEAVE IN PLACE FOR 30 MINUTES AND RINSE 02/24/17   [provider]  Dapagliflozin-Metformin HCl ER 03-999 MG TB24 Take by mouth.  02/22/17   [provider]  diltiazem (DILACOR XR) 120 MG 24 hr capsule Take 120 mg by mouth every morning.  12/03/14   [provider]  fluticasone Asencion Islam) 50 MCG/ACT nasal spray SPRAY 2 SPRAYS IN [facility] NOSTRIL DAILY 11/18/18   Birdie Sons, MD  glucose blood (ACCU-CHEK ACTIVE STRIPS) test strip Use as instructed 11/02/17   Chrismon, Simona Huh E, PA  Ixekizumab (TALTZ ) Inject into the skin.    [provider]  Lancets (ACCU-CHEK SOFT TOUCH) lancets Use as instructed 11/02/17   Chrismon, Simona Huh E, PA  montelukast (SINGULAIR) 10 MG tablet TAKE 1 TABLET BY MOUTH EVERY DAY 11/04/18   Chrismon, Simona Huh E, PA  sitaGLIPtin (JANUVIA) 100 MG tablet Take by mouth. 06/01/17 03/13/19  [provider]    Allergies Oxycodone  Family History  Problem Relation Age of Onset  . Coronary artery disease Mother   . Coronary artery disease Father   . Diabetes Father   . Emphysema Father   . Seizures Daughter   . Cancer Maternal Grandmother   . Breast cancer Cousin 17    Social History Social History   Tobacco Use  . Smoking status: Former Smoker    Years: 1.00    Last attempt to quit: 01/19/1985    Years since quitting: 34.1  . Smokeless tobacco: Never Used  Substance Use Topics  . Alcohol use: No    Alcohol/week: 0.0 standard drinks    Comment: occasionally 1 beer every 6 months  . Drug use: No    Review of Systems  Constitutional: + fever, chills, body aches Eyes: Negative for visual changes. ENT: + sore throat. Neck: No neck pain  Cardiovascular: Negative for chest pain. Respiratory: Negative for shortness of breath. + [productive cough Gastrointestinal: Negative for abdominal pain, vomiting or diarrhea. Genitourinary: Negative for dysuria. Musculoskeletal: Negative for back pain. Skin: Negative for rash. Neurological: Negative for headaches, weakness or numbness. Psych: No SI or HI  ____________________________________________   PHYSICAL EXAM:   VITAL SIGNS: ED Triage Vitals  Enc Vitals Group     BP 03/16/19 1752 (!) 147/105     Pulse Rate 03/16/19 1752 93     Resp 03/16/19 1752 18     Temp 03/16/19 1752 98.2 F (36.8 C)     Temp Source 03/16/19 1752 Oral     SpO2 03/16/19 1752 97 %     Weight 03/16/19 1753 213 lb (96.6 kg)     Height 03/16/19 1753 4\' 11"  (1.499 m)     Head Circumference --      Peak Flow --      Pain Score 03/16/19 1752 6     Pain Loc --      Pain Edu? --      Excl. in [place]? --     Constitutional: Alert and oriented. Well appearing and in no apparent distress. HEENT:      Head: Normocephalic and atraumatic.         Eyes: Conjunctivae are normal. Sclera is non-icteric.       Mouth/Throat: Mucous membranes are moist.       Neck: Supple with no signs of meningismus. Cardiovascular: Regular rate and rhythm. No murmurs, gallops, or rubs. 2+ symmetrical distal pulses  are present in all extremities. No JVD. Respiratory: Normal respiratory effort. Lungs are clear to auscultation bilaterally. No wheezes, crackles, or rhonchi.  Gastrointestinal: Soft, non tender, and non distended with positive bowel sounds. No rebound or guarding. Musculoskeletal: Nontender with normal range of motion in all extremities. No edema, cyanosis, or erythema of extremities. Neurologic: Normal speech and language. Face is symmetric. Moving all extremities. No gross focal neurologic deficits are appreciated. Skin: Skin is warm, dry and intact. No rash noted. Psychiatric: Mood and affect are normal. Speech and behavior are normal.  ____________________________________________   LABS (all labs ordered are listed, but only abnormal results are displayed)  Labs Reviewed  CBC WITH DIFFERENTIAL/PLATELET - Abnormal; Notable for the following components:      Result Value   RBC 5.40 (*)    Hemoglobin 15.1 (*)    All other components within normal limits  COMPREHENSIVE METABOLIC PANEL - Abnormal; Notable for the following components:    Glucose, Bld 110 (*)    Creatinine, Ser 0.43 (*)    ALT 71 (*)    All other components within normal limits  NOVEL CORONAVIRUS, NAA (HOSPITAL ORDER, SEND-OUT TO REF LAB)  HIV ANTIBODY (ROUTINE TESTING W REFLEX)  ABO/RH   ____________________________________________  EKG  none  ____________________________________________  RADIOLOGY  I have personally reviewed the images performed during this visit and I agree with the Radiologist's read.   Interpretation by Radiologist:  Dg Chest Portable 1 View  Result Date: 03/16/2019 CLINICAL DATA:  Sore throat with cough and runny nose. EXAM: PORTABLE CHEST 1 VIEW COMPARISON:  08/27/2018 FINDINGS: 1809 hours. The lungs are clear without focal pneumonia, edema, pneumothorax or pleural effusion. Cardiopericardial silhouette is at upper limits of normal for size. The visualized bony structures of the thorax are intact. IMPRESSION: No active disease. Electronically Signed   By: Misty Stanley M.D.   On: 03/16/2019 18:42     ____________________________________________   PROCEDURES  Procedure(s) performed: None Procedures Critical Care performed:  None ____________________________________________   INITIAL IMPRESSION / ASSESSMENT AND PLAN / ED COURSE  53 y.o. female with history of psoriasis on Izekizumab who presents for evaluation of fever, body aches, sore throat, sneezing, congestion, productive cough x4 days.  Currently on third day of Z-Pak with no improvement of her symptoms.  No known exposure to COVID.  Chest x-ray here is negative for pneumonia.  Vitals are within normal limits with normal work of breathing, normal sats, and afebrile.  Since patient is immune suppressed will check for COVID.  Labs with no leukocytosis, no evidence of DKA.  Discussed quarantine with patient until results are back.  Discussed strict return precautions and close follow-up with PCP.      As part of my medical decision making, I reviewed the following  data within the [facility] notes reviewed and incorporated, Labs reviewed , Old chart reviewed, Radiograph reviewed , Notes from prior ED visits and [place] Controlled Substance Database    Pertinent labs & imaging results that were available during my care of the patient were reviewed by me and considered in my medical decision making (see chart for details).    ____________________________________________   FINAL CLINICAL IMPRESSION(S) / ED DIAGNOSES  Final diagnoses:  Viral URI with cough      NEW MEDICATIONS STARTED DURING THIS VISIT:  ED Discharge Orders    None       Note:  This document was prepared using Dragon voice recognition software and may include unintentional  dictation errors.    Rudene Re, MD 03/16/19 [phone]

## 2019-03-16 NOTE — ED Triage Notes (Signed)
Pt arrived via POV with sore throat that started Sat night and a cough and runny nose that started Sun morning. 106 fever Monday morning but no fever since but body aches and pain since.

## 2019-03-16 NOTE — Telephone Encounter (Signed)
Please advise 

## 2019-03-16 NOTE — Discharge Instructions (Signed)
QUARANTINE INSTRUCTION  Follow these instructions at home:  Protecting others To avoid spreading the illness to other people: Quarantine in your home until you have had no cough and fever for 10 days. Household members should also be quarantine for at least 14 days after being exposed to you. Wash your hands often with soap and water. If soap and water are not available, use an alcohol-based hand sanitizer. If you have not cleaned your hands, do not touch your face. Make sure that all people in your household wash their hands well and often. Cover your nose and mouth when you cough or sneeze. Throw away used tissues. Stay home if you have any cold-like or flu-like symptoms. General instructions Take over-the-counter and prescription medicines only as told by your health care provider. If you need medication for fever take tylenol and avoid NSAIDs Drink enough fluid to keep your urine pale yellow. Rest at home as directed by your health care provider. Do not give aspirin to a child with the flu, because of the association with Reye's syndrome. Do not use tobacco products, including cigarettes, chewing tobacco, and e-cigarettes. If you need help quitting, ask your health care provider. Keep all follow-up visits as told by your health care provider. This is important. How is this prevented? Avoid areas where an outbreak has been reported. Avoid large groups of people. Keep a safe distance from people who are coughing and sneezing. Do not touch your face if you have not cleaned your hands. When you are around people who are sick or might be sick, wear a mask to protect yourself. Contact a health care provider if: You have symptoms of SARS (cough, fever, chest pain, shortness of breath) that are not getting better at home. You have a fever. If you have no difficulty breathing contact your doctor or call [place] hotline at [phone]. If you have difficulty breathing go to  your local ER or call 911

## 2019-03-16 NOTE — Telephone Encounter (Signed)
Patient called back again concerning message from earlier. Please advise?

## 2019-03-16 NOTE — ED Notes (Addendum)
This RN reviewed discharge paperwork with pt about test results and self quarantine. Pt verbally understood discharge instructions and follow up care. Pt signed physical discharge form.

## 2019-03-16 NOTE — Telephone Encounter (Signed)
Patient had a virtual visit on 03/13/2019 with Simona Huh and states that she was feeling better up until last night (03/15/2019) and this morning (03/16/2019). Patient states she is having sinus pressure, headache, cough, and chills. Patient has not had a fever since Monday night and denies any other symptoms at this moment. Please advise.

## 2019-03-17 ENCOUNTER — Telehealth: Payer: Self-pay

## 2019-03-17 NOTE — Telephone Encounter (Signed)
Advised patient as below.  

## 2019-03-17 NOTE — Telephone Encounter (Signed)
Patient states that she did go to the emergency room last night, but she is still waiting for her results from the COVID19 test. She will finish her Z-Pak today. Is there anything else she should be doing?

## 2019-03-17 NOTE — Telephone Encounter (Signed)
Maintain stay-at-home restrictions with social distancing, wash hands frequently and increase fluid intake. The Z-pak you take for 5 days lasts in the blood stream for 10 days, so you still have it active in you. May use Mucinex-DM if needed for cough and continue to monitor temperature. Not much more to do other than wait for the results of your tests.

## 2019-03-18 LAB — NOVEL CORONAVIRUS, NAA (HOSP ORDER, SEND-OUT TO REF LAB; TAT 18-24 HRS): SARS-CoV-2, NAA: NOT DETECTED

## 2019-03-18 LAB — HIV ANTIBODY (ROUTINE TESTING W REFLEX): HIV Screen 4th Generation wRfx: NONREACTIVE

## 2019-03-22 ENCOUNTER — Telehealth: Payer: Self-pay | Admitting: *Deleted

## 2019-03-22 NOTE — Telephone Encounter (Signed)
Patient called office requesting to speak to [facility]. Advised pt that Kelly Matthews is off on Wednesdays. Patient was wanting to discuss still having cough, after completing Z-pak. Patient wanted to discuss symptoms with another provider. Advised pt that being that it's so late in the day, it may be tomorrow before anyone can call her back.

## 2019-03-22 NOTE — Telephone Encounter (Signed)
She needs evisit or in office since her covid testing was negative.

## 2019-03-23 NOTE — Telephone Encounter (Signed)
No answer. Voicemail not set up yet.  

## 2019-05-02 ENCOUNTER — Encounter: Payer: Self-pay | Admitting: Family Medicine

## 2019-05-02 ENCOUNTER — Ambulatory Visit: Payer: 59 | Admitting: Family Medicine

## 2019-05-02 ENCOUNTER — Other Ambulatory Visit: Payer: Self-pay

## 2019-05-02 VITALS — BP 120/70 | HR 88 | Temp 98.2°F | Wt 225.0 lb

## 2019-05-02 DIAGNOSIS — E1169 Type 2 diabetes mellitus with other specified complication: Secondary | ICD-10-CM

## 2019-05-02 DIAGNOSIS — R5383 Other fatigue: Secondary | ICD-10-CM | POA: Diagnosis not present

## 2019-05-02 NOTE — Progress Notes (Signed)
Kelly Matthews  MRN: [id number] DOB: [birth year]  Subjective:  HPI   The patient reports not feeling well for about 1 week.  Weak, tired, fatigued and she thought maybe it was her glucose but her readings have been running 73-120.  She complains of achy feeling and headaches.   She denies any fevers, joint pain or tick bites.  Patient Active Problem List   Diagnosis Date Noted  . Psoriasis 03/13/2019  . Stomach irritation   . Abdominal pain, epigastric   . Special screening for malignant neoplasms, colon   . Polyp of sigmoid colon   . Right leg pain 02/24/2018  . Women's annual routine gynecological examination 05/14/2017  . Malnutrition of moderate degree 12/07/2016  . DKA (diabetic ketoacidoses) ([place]) 12/05/2016  . Migraine headache 05/05/2016  . Allergic rhinitis 07/23/2015  . Excess, menstruation 07/19/2015  . 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 Blood glucose elevated 07/19/2015  . Airway hyperreactivity 07/19/2015  . Disorder of esophagus 07/19/2015  . Hypercholesterolemia without hypertriglyceridemia 07/19/2015  . Borderline diabetes 07/19/2015  . Diabetes ([place]) 11/28/2014  . Essential (primary) hypertension 11/28/2014  . Aortic heart valve narrowing 11/28/2014  . 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 Supraventricular tachycardia ([place]) 11/28/2014  . 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 Diabetes mellitus without complication ([place])   . Fatty liver   . Hypertension   . Psoriasis (a type of skin inflammation)   . Tachycardia     Social History   Socioeconomic History  . Marital status: Widowed    Spouse name: Not on file  . Number of children: Not on file  . Years of education: Not on file  . Highest education level: Not on file  Occupational History  . Not on file  Social Needs  . Financial resource strain: Not on file  . Food  insecurity    Worry: Not on file    Inability: Not on file  . Transportation needs    Medical: Not on file    Non-medical: Not on file  Tobacco Use  . Smoking status: Former Smoker    Years: 1.00    Quit date: 01/19/1985    Years since quitting: 34.3  . Smokeless tobacco: Never Used  Substance and Sexual Activity  . Alcohol use: No    Alcohol/week: 0.0 standard drinks    Comment: occasionally 1 beer every 6 months  . Drug use: No  . Sexual activity: Yes    Birth control/protection: I.U.D.  Lifestyle  . Physical activity    Days per week: Not on file    Minutes per session: Not on file  . Stress: Not on file  Relationships  . Social Herbalist on phone: Not on file    Gets together: Not on file    Attends religious service: Not on file    Active member of club or organization: Not on file    Attends meetings of clubs or organizations: Not on file    Relationship status: Not on file  . Intimate partner violence    Fear of current or ex partner: Not on file    Emotionally abused: Not on file    Physically abused: Not on file    Forced sexual activity: Not on file  Other Topics Concern  . Not on file  Social History Narrative  . Not on file  Outpatient Encounter Medications as of 05/02/2019  Medication Sig  . B-D INS SYR ULTRAFINE 1CC/31G 31G X 5/16" 1 ML MISC USE ONE SYRINGE FOR EACH DOSAGE (NOVOLOG THREE TIMES A DAY AND LANTUS ONCE A DAY)  . clobetasol (TEMOVATE) 0.05 % external solution APPLY TWICE DAILY TO SCALP FOR ITCH  . Clobetasol Propionate 0.05 % shampoo SHAMPOO DAILY. LEAVE IN PLACE FOR 30 MINUTES AND RINSE  . Dapagliflozin-Metformin HCl ER 03-999 MG TB24 Take by mouth.  . diltiazem (DILACOR XR) 120 MG 24 hr capsule Take 120 mg by mouth every morning.   . fluticasone (FLONASE) 50 MCG/ACT nasal spray SPRAY 2 SPRAYS IN EACH NOSTRIL DAILY  . glucose blood (ACCU-CHEK ACTIVE STRIPS) test strip Use as instructed  . Lancets (ACCU-CHEK SOFT TOUCH) lancets  Use as instructed  . montelukast (SINGULAIR) 10 MG tablet TAKE 1 TABLET BY MOUTH EVERY DAY  . sitaGLIPtin (JANUVIA) 100 MG tablet Take by mouth.  . [DISCONTINUED] amitriptyline (ELAVIL) 10 MG tablet TAKE 1 TABLET BY MOUTH THREE TIMES A DAY (Patient not taking: Reported on 03/13/2019)  . [DISCONTINUED] azithromycin (ZITHROMAX) 250 MG tablet Take 2 tablets by mouth today then one tablet daily for 4 days.  . [DISCONTINUED] Ixekizumab (TALTZ [place]) Inject into the skin.   No facility-administered encounter medications on file as of 05/02/2019.     Allergies  Allergen Reactions  . Oxycodone Itching    Review of Systems  Constitutional: Positive for malaise/fatigue. Negative for chills, diaphoresis and fever.  HENT: Positive for sinus pain (allergies). Negative for congestion, ear discharge, ear pain, hearing loss, nosebleeds, sore throat and tinnitus.   Eyes: Negative for pain and discharge.  Respiratory: Negative for cough, hemoptysis, sputum production, shortness of breath and wheezing.   Cardiovascular: Negative for chest pain, palpitations and leg swelling.  Skin: Negative for itching and rash.  Neurological: Positive for weakness and headaches. Negative for dizziness.    Objective:  BP 120/70 (BP Location: Right Arm, Patient Position: Sitting, Cuff Size: Normal)   Pulse 88   Temp 98.2 F (36.8 C) (Oral)   Wt 225 lb (102.1 kg)   SpO2 99%   BMI 45.44 kg/m   Physical Exam  Constitutional: She is oriented to person, place, and time and well-developed, well-nourished, and in no distress.  HENT:  Head: Normocephalic.  Eyes: Conjunctivae are normal.  Neck: Neck supple.  Cardiovascular: Normal rate and regular rhythm.  Pulmonary/Chest: Effort normal and breath sounds normal.  Abdominal: Soft. Bowel sounds are normal.  Musculoskeletal: Normal range of motion.  Neurological: She is alert and oriented to person, place, and time.  Skin: No rash noted.  Psychiatric: Mood, affect and  judgment normal.    Assessment and Plan :   1. Fatigue, unspecified type Having feelings of fatigue/tiredness, low sugars (70-80) over the past week. No fever, cough, sorethroat, earache, GI upset, loss of sense of taste or smell. Has had 2 migraines the past week that responded well to Tylenol and Ibuprofen. Had a negative COVID test on 03-16-19. No new exposures known. Working at [employer] for long hours recently. Will get labs to rule out infection, anemia, dehydration or electrolyte imbalances. - CBC with Differential/Platelet - Comprehensive metabolic panel  2. Type 2 diabetes mellitus with other specified complication, without long-term current use of insulin (HCC) Followed by Dr. Gabriel Carina (endocrinologist) and presently on Januvia 100 mg qd with Xigduo 03-999 mg qd. Follows glucose by [facility] and present reading is 104. Has had drops to 70-80 a couple  times and had more fatigue. Ate a snack or had a soft drink and felt better. Will check labs and recommend eating protein snacks between meals when working long hours. Continue follow up with Dr. Gabriel Carina regularly (sooner if needed). - CBC with Differential/Platelet - Comprehensive metabolic panel

## 2019-05-03 LAB — CBC WITH DIFFERENTIAL/PLATELET
Basophils Absolute: 0.1 10*3/uL (ref 0.0–0.2)
Basos: 1 %
EOS (ABSOLUTE): 0 10*3/uL (ref 0.0–0.4)
Eos: 0 %
Hematocrit: 42.1 % (ref 34.0–46.6)
Hemoglobin: 14.5 g/dL (ref 11.1–15.9)
Immature Grans (Abs): 0.1 10*3/uL (ref 0.0–0.1)
Immature Granulocytes: 1 %
Lymphocytes Absolute: 1.7 10*3/uL (ref 0.7–3.1)
Lymphs: 16 %
MCH: 28.7 pg (ref 26.6–33.0)
MCHC: 34.4 g/dL (ref 31.5–35.7)
MCV: 83 fL (ref 79–97)
Monocytes Absolute: 0.5 10*3/uL (ref 0.1–0.9)
Monocytes: 5 %
Neutrophils Absolute: 8.5 10*3/uL — ABNORMAL HIGH (ref 1.4–7.0)
Neutrophils: 77 %
Platelets: 160 10*3/uL (ref 150–450)
RBC: 5.05 x10E6/uL (ref 3.77–5.28)
RDW: 13.2 % (ref 11.7–15.4)
WBC: 11 10*3/uL — ABNORMAL HIGH (ref 3.4–10.8)

## 2019-05-03 LAB — COMPREHENSIVE METABOLIC PANEL
ALT: 35 IU/L — ABNORMAL HIGH (ref 0–32)
AST: 23 IU/L (ref 0–40)
Albumin/Globulin Ratio: 1.8 (ref 1.2–2.2)
Albumin: 4.5 g/dL (ref 3.8–4.9)
Alkaline Phosphatase: 87 IU/L (ref 39–117)
BUN/Creatinine Ratio: 24 — ABNORMAL HIGH (ref 9–23)
BUN: 13 mg/dL (ref 6–24)
Bilirubin Total: 0.5 mg/dL (ref 0.0–1.2)
CO2: 19 mmol/L — ABNORMAL LOW (ref 20–29)
Calcium: 9.7 mg/dL (ref 8.7–10.2)
Chloride: 99 mmol/L (ref 96–106)
Creatinine, Ser: 0.55 mg/dL — ABNORMAL LOW (ref 0.57–1.00)
GFR calc Af Amer: 125 mL/min/{1.73_m2} (ref >59)
GFR calc non Af Amer: 108 mL/min/{1.73_m2} (ref >59)
Globulin, Total: 2.5 g/dL (ref 1.5–4.5)
Glucose: 79 mg/dL (ref 65–99)
Potassium: 4.3 mmol/L (ref 3.5–5.2)
Sodium: 134 mmol/L (ref 134–144)
Total Protein: 7 g/dL (ref 6.0–8.5)

## 2019-05-05 ENCOUNTER — Telehealth: Payer: Self-pay | Admitting: Family Medicine

## 2019-05-05 ENCOUNTER — Telehealth: Payer: Self-pay

## 2019-05-05 NOTE — Telephone Encounter (Signed)
-----   Message from Margo Common, [place] sent at 05/04/2019  5:37 PM EDT ----- Labs essentially normal with only minor variations. No sign of anemia or electrolyte changes. Proceed with dietary changes to maintain a steady glucose level as discussed in the office. Recheck with Dr. Gabriel Carina if sugars won't come under control.

## 2019-05-05 NOTE — Telephone Encounter (Signed)
Patient advised.KW 

## 2019-05-05 NOTE — Telephone Encounter (Signed)
Patient was advised.  

## 2019-05-05 NOTE — Telephone Encounter (Signed)
Pt would like a nurse to call her back regarding her labs  She seen them on mychart but does not understand them.  She also states she still does not feel well/ very tired.and lethargic  [phone]

## 2019-05-31 ENCOUNTER — Other Ambulatory Visit: Payer: Self-pay | Admitting: Family Medicine

## 2019-05-31 DIAGNOSIS — Z1231 Encounter for screening mammogram for malignant neoplasm of breast: Secondary | ICD-10-CM

## 2019-06-19 ENCOUNTER — Other Ambulatory Visit: Payer: Self-pay

## 2019-06-19 ENCOUNTER — Ambulatory Visit (INDEPENDENT_AMBULATORY_CARE_PROVIDER_SITE_OTHER): Payer: No Typology Code available for payment source | Admitting: Physician Assistant

## 2019-06-19 ENCOUNTER — Encounter: Payer: Self-pay | Admitting: Physician Assistant

## 2019-06-19 VITALS — BP 130/72 | HR 100 | Temp 98.5°F | Resp 16 | Wt 222.2 lb

## 2019-06-19 DIAGNOSIS — M62830 Muscle spasm of back: Secondary | ICD-10-CM | POA: Diagnosis not present

## 2019-06-19 MED ORDER — MELOXICAM 15 MG PO TABS: 15.0000 mg | ORAL_TABLET | Freq: Every day | ORAL | 0 refills | Status: DC

## 2019-06-19 MED ORDER — CYCLOBENZAPRINE HCL 5 MG PO TABS: 5.0000 mg | ORAL_TABLET | Freq: Three times a day (TID) | ORAL | 1 refills | Status: DC | PRN

## 2019-06-19 NOTE — Patient Instructions (Signed)
Muscle Cramps and Spasms Muscle cramps and spasms are when muscles tighten by themselves. They usually get better within minutes. Muscle cramps are painful. They are usually stronger and last longer than muscle spasms. Muscle spasms may or may not be painful. They can last a few seconds or much longer. Cramps and spasms can affect any muscle, but they occur most often in the calf muscles of the leg. They are usually not caused by a serious problem. In many cases, the cause is not known. Some common causes include:  Doing more physical work or exercise than your body is ready for.  Using the muscles too much (overuse) by repeating certain movements too many times.  Staying in a certain position for a long time.  Playing a sport or doing an activity without preparing properly.  Using bad form or technique while playing a sport or doing an activity.  Not having enough water in your body (dehydration).  Injury.  Side effects of some medicines.  Low levels of the salts and minerals in your blood (electrolytes), such as low potassium or calcium. Follow these instructions at home: Managing pain and stiffness      Massage, stretch, and relax the muscle. Do this for many minutes at a time.  If told, put heat on tight or tense muscles as often as told by your doctor. Use the heat source that your doctor recommends, such as a moist heat pack or a heating pad. ? Place a towel between your skin and the heat source. ? Leave the heat on for 20-30 minutes. ? Remove the heat if your skin turns bright red. This is very important if you are not able to feel pain, heat, or cold. You may have a greater risk of getting burned.  If told, put ice on the affected area. This may help if you are sore or have pain after a cramp or spasm. ? Put ice in a plastic bag. ? Place a towel between your skin and the bag. ? Leave the ice on for 20 minutes, 2-3 times a day.  Try taking hot showers or baths to help  relax tight muscles. Eating and drinking  Drink enough fluid to keep your pee (urine) pale yellow.  Eat a healthy diet to help ensure that your muscles work well. This should include: ? Fruits and vegetables. ? Lean protein. ? Whole grains. ? Low-fat or nonfat dairy products. General instructions  If you are having cramps often, avoid intense exercise for several days.  Take over-the-counter and prescription medicines only as told by your doctor.  Watch for any changes in your symptoms.  Keep all follow-up visits as told by your doctor. This is important. Contact a doctor if:  Your cramps or spasms get worse or happen more often.  Your cramps or spasms do not get better with time. Summary  Muscle cramps and spasms are when muscles tighten by themselves. They usually get better within minutes.  Cramps and spasms occur most often in the calf muscles of the leg.  Massage, stretch, and relax the muscle. This may help the cramp or spasm go away.  Drink enough fluid to keep your pee (urine) pale yellow. This information is not intended to replace advice given to you by your health care provider. Make sure you discuss any questions you have with your health care provider. Document Released: 10/15/2008 Document Revised: 03/28/2018 Document Reviewed: 03/28/2018 Elsevier Patient Education  [place].  Meloxicam tablets What is this  medicine? MELOXICAM (mel OX i cam) is a non-steroidal anti-inflammatory drug (NSAID). It is used to reduce swelling and to treat pain. It may be used for osteoarthritis, rheumatoid arthritis, or juvenile rheumatoid arthritis. This medicine may be used for other purposes; ask your health care provider or pharmacist if you have questions. COMMON BRAND NAME(S): Mobic What should I tell my health care provider before I take this medicine? They need to know if you have any of these conditions:  bleeding disorders  cigarette smoker  coronary  artery bypass graft (CABG) surgery within the past 2 weeks  drink more than 3 alcohol-containing drinks per day  heart disease  high blood pressure  history of stomach bleeding  kidney disease  liver disease  lung or breathing disease, like asthma  stomach or intestine problems  an unusual or allergic reaction to meloxicam, aspirin, other NSAIDs, other medicines, foods, dyes, or preservatives  pregnant or trying to get pregnant  breast-feeding How should I use this medicine? Take this medicine by mouth with a full glass of water. Follow the directions on the prescription label. You can take it with or without food. If it upsets your stomach, take it with food. Take your medicine at regular intervals. Do not take it more often than directed. Do not stop taking except on your doctor's advice. A special MedGuide will be given to you by the pharmacist with each prescription and refill. Be sure to read this information carefully each time. Talk to your pediatrician regarding the use of this medicine in children. While this drug may be prescribed for selected conditions, precautions do apply. Patients over 99 years old may have a stronger reaction and need a smaller dose. Overdosage: If you think you have taken too much of this medicine contact a poison control center or emergency room at once. NOTE: This medicine is only for you. Do not share this medicine with others. What if I miss a dose? If you miss a dose, take it as soon as you can. If it is almost time for your next dose, take only that dose. Do not take double or extra doses. What may interact with this medicine? Do not take this medicine with any of the following medications:  cidofovir  ketorolac This medicine may also interact with the following medications:  aspirin and aspirin-like medicines  certain medicines for blood pressure, heart disease, irregular heart beat  certain medicines for depression, anxiety, or  psychotic disturbances  certain medicines that treat or prevent blood clots like warfarin, enoxaparin, dalteparin, apixaban, dabigatran, rivaroxaban  cyclosporine  diuretics  fluconazole  lithium  methotrexate  other NSAIDs, medicines for pain and inflammation, like ibuprofen and naproxen  pemetrexed This list may not describe all possible interactions. Give your health care provider a list of all the medicines, herbs, non-prescription drugs, or dietary supplements you use. Also tell them if you smoke, drink alcohol, or use illegal drugs. Some items may interact with your medicine. What should I watch for while using this medicine? Tell your doctor or healthcare provider if your symptoms do not start to get better or if they get worse. This medicine may cause serious skin reactions. They can happen weeks to months after starting the medicine. Contact your healthcare provider right away if you notice fevers or flu-like symptoms with a rash. The rash may be red or purple and then turn into blisters or peeling of the skin. Or, you might notice a red rash with swelling of the face,  lips or lymph nodes in your neck or under your arms. Do not take other medicines that contain aspirin, ibuprofen, or naproxen with this medicine. Side effects such as stomach upset, nausea, or ulcers may be more likely to occur. Many medicines available without a prescription should not be taken with this medicine. This medicine can cause ulcers and bleeding in the stomach and intestines at any time during treatment. This can happen with no warning and may cause death. There is increased risk with taking this medicine for a long time. Smoking, drinking alcohol, older age, and poor health can also increase risks. Call your doctor right away if you have stomach pain or blood in your vomit or stool. This medicine does not prevent heart attack or stroke. In fact, this medicine may increase the chance of a heart attack or  stroke. The chance may increase with longer use of this medicine and in people who have heart disease. If you take aspirin to prevent heart attack or stroke, talk with your doctor or healthcare provider. What side effects may I notice from receiving this medicine? Side effects that you should report to your doctor or health care professional as soon as possible:  allergic reactions like skin rash, itching or hives, swelling of the face, lips, or tongue  nausea, vomiting  redness, blistering, peeling, or loosening of the skin, including inside the mouth  signs and symptoms of a blood clot such as breathing problems; changes in vision; chest pain; severe, sudden headache; pain, swelling, warmth in the leg; trouble speaking; sudden numbness or weakness of the face, arm, or leg  signs and symptoms of bleeding such as bloody or black, tarry stools; red or dark-brown urine; spitting up blood or brown material that looks like coffee grounds; red spots on the skin; unusual bruising or bleeding from the eye, gums, or nose  signs and symptoms of liver injury like dark yellow or brown urine; general ill feeling or flu-like symptoms; light-colored stools; loss of appetite; nausea; right upper belly pain; unusually weak or tired; yellowing of the eyes or skin  signs and symptoms of stroke like changes in vision; confusion; trouble speaking or understanding; severe headaches; sudden numbness or weakness of the face, arm, or leg; trouble walking; dizziness; loss of balance or coordination Side effects that usually do not require medical attention (report to your doctor or health care professional if they continue or are bothersome):  constipation  diarrhea  gas This list may not describe all possible side effects. Call your doctor for medical advice about side effects. You may report side effects to FDA at 1-800-FDA-1088. Where should I keep my medicine? Keep out of the reach of children. Store at room  temperature between 15 and 30 degrees C (59 and 86 degrees F). Throw away any unused medicine after the expiration date. NOTE: This sheet is a summary. It may not cover all possible information. If you have questions about this medicine, talk to your doctor, pharmacist, or health care provider.  2020 Elsevier/Gold Standard (2019-02-01 11:21:28)  Cyclobenzaprine tablets What is this medicine? CYCLOBENZAPRINE (sye kloe BEN za preen) is a muscle relaxer. It is used to treat muscle pain, spasms, and stiffness. This medicine may be used for other purposes; ask your health care provider or pharmacist if you have questions. COMMON BRAND NAME(S): Fexmid, Flexeril What should I tell my health care provider before I take this medicine? They need to know if you have any of these conditions:  heart disease,  irregular heartbeat, or previous heart attack  liver disease  thyroid problem  an unusual or allergic reaction to cyclobenzaprine, tricyclic antidepressants, lactose, other medicines, foods, dyes, or preservatives  pregnant or trying to get pregnant  breast-feeding How should I use this medicine? Take this medicine by mouth with a glass of water. Follow the directions on the prescription label. If this medicine upsets your stomach, take it with food or milk. Take your medicine at regular intervals. Do not take it more often than directed. Talk to your pediatrician regarding the use of this medicine in children. Special care may be needed. Overdosage: If you think you have taken too much of this medicine contact a poison control center or emergency room at once. NOTE: This medicine is only for you. Do not share this medicine with others. What if I miss a dose? If you miss a dose, take it as soon as you can. If it is almost time for your next dose, take only that dose. Do not take double or extra doses. What may interact with this medicine? Do not take this medicine with any of the following  medications:  MAOIs like Carbex, Eldepryl, Marplan, Nardil, and Parnate  narcotic medicines for cough  safinamide This medicine may also interact with the following medications:  alcohol  bupropion  antihistamines for allergy, cough and cold  certain medicines for anxiety or sleep  certain medicines for bladder problems like oxybutynin, tolterodine  certain medicines for depression like amitriptyline, fluoxetine, sertraline  certain medicines for Parkinson's disease like benztropine, trihexyphenidyl  certain medicines for seizures like phenobarbital, primidone  certain medicines for stomach problems like dicyclomine, hyoscyamine  certain medicines for travel sickness like scopolamine  general anesthetics like halothane, isoflurane, methoxyflurane, propofol  ipratropium  local anesthetics like lidocaine, pramoxine, tetracaine  medicines that relax muscles for surgery  narcotic medicines for pain  phenothiazines like chlorpromazine, mesoridazine, prochlorperazine, thioridazine  verapamil This list may not describe all possible interactions. Give your health care provider a list of all the medicines, herbs, non-prescription drugs, or dietary supplements you use. Also tell them if you smoke, drink alcohol, or use illegal drugs. Some items may interact with your medicine. What should I watch for while using this medicine? Tell your doctor or health care professional if your symptoms do not start to get better or if they get worse. You may get drowsy or dizzy. Do not drive, use machinery, or do anything that needs mental alertness until you know how this medicine affects you. Do not stand or sit up quickly, especially if you are an older patient. This reduces the risk of dizzy or fainting spells. Alcohol may interfere with the effect of this medicine. Avoid alcoholic drinks. If you are taking another medicine that also causes drowsiness, you may have more side effects. Give  your health care provider a list of all medicines you use. Your doctor will tell you how much medicine to take. Do not take more medicine than directed. Call emergency for help if you have problems breathing or unusual sleepiness. Your mouth may get dry. Chewing sugarless gum or sucking hard candy, and drinking plenty of water may help. Contact your doctor if the problem does not go away or is severe. What side effects may I notice from receiving this medicine? Side effects that you should report to your doctor or health care professional as soon as possible:  allergic reactions like skin rash, itching or hives, swelling of the face, lips, or tongue  breathing problems  chest pain  fast, irregular heartbeat  hallucinations  seizures  unusually weak or tired Side effects that usually do not require medical attention (report to your doctor or health care professional if they continue or are bothersome):  headache  nausea, vomiting This list may not describe all possible side effects. Call your doctor for medical advice about side effects. You may report side effects to FDA at 1-800-FDA-1088. Where should I keep my medicine? Keep out of the reach of children. Store at room temperature between 15 and 30 degrees C (59 and 86 degrees F). Keep container tightly closed. Throw away any unused medicine after the expiration date. NOTE: This sheet is a summary. It may not cover all possible information. If you have questions about this medicine, talk to your doctor, pharmacist, or health care provider.  2020 Elsevier/Gold Standard (2018-10-05 12:49:26)

## 2019-06-19 NOTE — Progress Notes (Signed)
Patient: Kelly Matthews Female    DOB: [birth year]   53 y.o.   MRN: [id number] Visit Date: 06/19/2019  Today's Provider: Mar Daring, PA-C   Chief Complaint  Patient presents with  . Arm Pain   Subjective:     Arm Pain  The incident occurred 6 to 12 hours ago. There was no injury mechanism. The pain is present in the right shoulder, right forearm and right hand (right arm radiating to the neck area.). Quality: throbbing pain. The pain radiates to the right neck. The pain is at a severity of 9/10. The pain has been constant since the incident. Pertinent negatives include no chest pain, muscle weakness, numbness or tingling. Nothing aggravates the symptoms. She has tried acetaminophen and NSAIDs (Gabapentin, back and body otc acetaminophen-nsaid) for the symptoms. The treatment provided no relief.   Does have history of cervical disc disease requiring surgery and states this feels similar.  Allergies  Allergen Reactions  . Oxycodone Itching     Current Outpatient Medications:  .  clobetasol (TEMOVATE) 0.05 % external solution, APPLY TWICE DAILY TO SCALP FOR ITCH, Disp: , Rfl: 4 .  Clobetasol Propionate 0.05 % shampoo, SHAMPOO DAILY. LEAVE IN PLACE FOR 30 MINUTES AND RINSE, Disp: , Rfl: 3 .  Dapagliflozin-Metformin HCl ER 03-999 MG TB24, Take by mouth., Disp: , Rfl:  .  diltiazem (DILACOR XR) 120 MG 24 hr capsule, Take 120 mg by mouth every morning. , Disp: , Rfl:  .  fluticasone (FLONASE) 50 MCG/ACT nasal spray, SPRAY 2 SPRAYS IN EACH NOSTRIL DAILY, Disp: 16 g, Rfl: 5 .  glucose blood (ACCU-CHEK ACTIVE STRIPS) test strip, Use as instructed, Disp: 300 each, Rfl: 3 .  Lancets (ACCU-CHEK SOFT TOUCH) lancets, Use as instructed, Disp: 300 each, Rfl: 3 .  montelukast (SINGULAIR) 10 MG tablet, TAKE 1 TABLET BY MOUTH EVERY DAY, Disp: 90 tablet, Rfl: 1 .  telmisartan (MICARDIS) 40 MG tablet, Take 40 mg by mouth daily., Disp: , Rfl:   Review of Systems  Constitutional:  Negative.   Eyes: Negative for visual disturbance.  Cardiovascular: Negative for chest pain.  Musculoskeletal: Positive for arthralgias, myalgias and neck stiffness.  Neurological: Negative for dizziness, tingling, weakness, light-headedness, numbness and headaches.    Social History   Tobacco Use  . Smoking status: Former Smoker    Years: 1.00    Quit date: 01/19/1985    Years since quitting: 34.4  . Smokeless tobacco: Never Used  Substance Use Topics  . Alcohol use: No    Alcohol/week: 0.0 standard drinks    Comment: occasionally 1 beer every 6 months      Objective:   BP 130/72 (BP Location: Left Wrist, Patient Position: Sitting, Cuff Size: Normal)   Pulse 100   Temp 98.5 F (36.9 C) (Oral)   Resp 16   Wt 222 lb 3.2 oz (100.8 kg)   SpO2 99%   BMI 44.88 kg/m  Vitals:   06/19/19 1341  BP: 130/72  Pulse: 100  Resp: 16  Temp: 98.5 F (36.9 C)  TempSrc: Oral  SpO2: 99%  Weight: 222 lb 3.2 oz (100.8 kg)     Physical Exam Vitals signs reviewed.  Constitutional:      General: She is not in acute distress.    Appearance: Normal appearance. She is well-developed. She is not diaphoretic.  Neck:     Musculoskeletal: Normal range of motion and neck supple. Normal range of motion. Muscular tenderness present. No  spinous process tenderness.  Cardiovascular:     Rate and Rhythm: Normal rate and regular rhythm.     Heart sounds: Normal heart sounds. No murmur. No friction rub. No gallop.   Pulmonary:     Effort: Pulmonary effort is normal. No respiratory distress.     Breath sounds: Normal breath sounds. No wheezing or rales.  Musculoskeletal:     Right shoulder: She exhibits decreased range of motion and tenderness. She exhibits no spasm, normal pulse and normal strength.       Arms:  Neurological:     Mental Status: She is alert.      No results found for any visits on 06/19/19.     Assessment & Plan    1. Muscle spasm of back Will treat conservatively with  flexeril and meloxicam. Heat to area. Light stretches. Discussed self massage using back buddy. Call if worsening and will consider xray and possible referral vs PT. - cyclobenzaprine (FLEXERIL) 5 MG tablet; Take 1 tablet (5 mg total) by mouth 3 (three) times daily as needed for muscle spasms.  Dispense: 30 tablet; Refill: 1 - meloxicam (MOBIC) 15 MG tablet; Take 1 tablet (15 mg total) by mouth daily.  Dispense: 30 tablet; Refill: 0     Mar Daring, PA-C  [facility]

## 2019-06-20 ENCOUNTER — Encounter: Payer: Self-pay | Admitting: Physician Assistant

## 2019-06-20 DIAGNOSIS — M542 Cervicalgia: Secondary | ICD-10-CM

## 2019-06-20 DIAGNOSIS — M25519 Pain in unspecified shoulder: Secondary | ICD-10-CM

## 2019-06-20 DIAGNOSIS — M792 Neuralgia and neuritis, unspecified: Secondary | ICD-10-CM

## 2019-07-04 ENCOUNTER — Ambulatory Visit
Admission: RE | Admit: 2019-07-04 | Discharge: 2019-07-04 | Disposition: A | Discharge status: Home / Self Care | Payer: 59 | Source: Ambulatory Visit | Attending: Family Medicine | Admitting: Family Medicine

## 2019-07-04 DIAGNOSIS — Z1231 Encounter for screening mammogram for malignant neoplasm of breast: Secondary | ICD-10-CM

## 2019-07-10 ENCOUNTER — Telehealth: Payer: Self-pay

## 2019-07-10 NOTE — Telephone Encounter (Signed)
-----   Message from Margo Common, [place] sent at 07/09/2019  8:44 PM EDT ----- Normal mammograms without sign of malignancy. Repeat in a year.

## 2019-07-10 NOTE — Telephone Encounter (Signed)
[id number] 

## 2019-07-12 ENCOUNTER — Other Ambulatory Visit: Payer: Self-pay | Admitting: Physician Assistant

## 2019-07-12 DIAGNOSIS — M62830 Muscle spasm of back: Secondary | ICD-10-CM

## 2019-07-18 NOTE — Telephone Encounter (Signed)
Left a detailed message. Per DPR ok to leave message.

## 2019-08-10 ENCOUNTER — Other Ambulatory Visit: Payer: Self-pay | Admitting: Physician Assistant

## 2019-08-10 DIAGNOSIS — M62830 Muscle spasm of back: Secondary | ICD-10-CM

## 2019-09-27 ENCOUNTER — Other Ambulatory Visit: Payer: Self-pay | Admitting: Physician Assistant

## 2019-09-27 DIAGNOSIS — M62830 Muscle spasm of back: Secondary | ICD-10-CM

## 2019-10-28 ENCOUNTER — Other Ambulatory Visit: Payer: Self-pay | Admitting: Physician Assistant

## 2019-10-28 DIAGNOSIS — M62830 Muscle spasm of back: Secondary | ICD-10-CM

## 2019-10-29 NOTE — Telephone Encounter (Signed)
Requested medication (s) are due for refill today: yes  Requested medication (s) are on the active medication list: yes  Last refill: 10/15/2019  Future visit scheduled: no  Notes to clinic:  refill cannot be delegated    Requested Prescriptions  Pending Prescriptions Disp Refills   cyclobenzaprine (FLEXERIL) 5 MG tablet [Pharmacy Med Name: CYCLOBENZAPRINE 5 MG TABLET] 30 tablet 1    Sig: TAKE 1 TABLET BY MOUTH THREE TIMES A DAY AS NEEDED FOR MUSCLE SPASMS      Not Delegated - Analgesics:  Muscle Relaxants Failed - 10/28/2019 11:05 PM      Failed - This refill cannot be delegated      Passed - Valid encounter within last 6 months    Recent Outpatient Visits           4 months ago Muscle spasm of back   [place], PA-C   6 months ago Fatigue, unspecified type   [facility], Vickki Muff, [place]   7 months ago Subacute pansinusitis   [place], [place]   11 months ago [place], Kirstie Peri, MD   12 months ago Breast wound, left, sequela   [facility] [place], [place], [place]

## 2019-10-30 ENCOUNTER — Ambulatory Visit: Payer: 59 | Admitting: Obstetrics and Gynecology

## 2019-12-07 ENCOUNTER — Other Ambulatory Visit: Payer: Self-pay

## 2019-12-07 ENCOUNTER — Encounter: Payer: Self-pay | Admitting: Obstetrics and Gynecology

## 2019-12-07 ENCOUNTER — Ambulatory Visit (INDEPENDENT_AMBULATORY_CARE_PROVIDER_SITE_OTHER): Payer: No Typology Code available for payment source | Admitting: Obstetrics and Gynecology

## 2019-12-07 VITALS — BP 118/74 | Ht 60.0 in | Wt 215.0 lb

## 2019-12-07 DIAGNOSIS — R232 Flushing: Secondary | ICD-10-CM

## 2019-12-07 DIAGNOSIS — Z01419 Encounter for gynecological examination (general) (routine) without abnormal findings: Secondary | ICD-10-CM | POA: Diagnosis not present

## 2019-12-07 DIAGNOSIS — Z1339 Encounter for screening examination for other mental health and behavioral disorders: Secondary | ICD-10-CM

## 2019-12-07 DIAGNOSIS — Z1331 Encounter for screening for depression: Secondary | ICD-10-CM

## 2019-12-07 MED ORDER — PAROXETINE HCL 10 MG PO TABS: 10.0000 mg | ORAL_TABLET | Freq: Every day | ORAL | 3 refills | Status: DC

## 2019-12-07 NOTE — Progress Notes (Signed)
Gynecology Annual Exam  PCP: Margo Common, PA  Chief Complaint  Patient presents with  . Annual Exam    History of Present Illness:  Ms. Kelly Matthews is a 54 y.o. [phone] who LMP was No LMP recorded. (Menstrual status: IUD)., presents today for her annual examination.  Her menses are absent due to her IUD. No vaginal bleeding since 2018.    She does have vasomotor symptoms. Hot flashes are pretty bad. Occur every night. They make it difficult for her to sleep.   She is sexually active. She uses an IUD for contraception. She does not have vaginal dryness.  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 There is no FH of ovarian cancer. The patient does not do self-breast exams.  Colonoscopy: 2019.  Polyps were found and removed.  She is supposed to have another colonoscopy in 6 months. DEXA: has not been screened for osteoporosis  Tobacco use: The patient denies current or previous tobacco use. Alcohol use: social drinker Exercise: walks a lot  The patient wears seatbelts: yes.     Past Medical History:  Diagnosis Date  . 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 Diabetes mellitus without complication ([place])   . Fatty liver   . Hypertension   . Psoriasis (a type of skin inflammation)   . Tachycardia     Past Surgical History:  Procedure Laterality Date  . ANTERIOR CERVICAL DECOMP/DISCECTOMY FUSION N/A 02/01/2017   Procedure: ANTERIOR CERVICAL DECOMPRESSION/DISCECTOMY FUSION 1 LEVEL;  Surgeon: Bayard Hugger, MD;  Location: [facility] ORS;  Service: Neurosurgery;  Laterality: N/A;  . BREAST BIOPSY Left 04/18/2015   Benign calcification  . CESAREAN SECTION  1990,2001  . COLONOSCOPY WITH PROPOFOL N/A 06/15/2018    Procedure: COLONOSCOPY WITH PROPOFOL;  Surgeon: Virgel Manifold, MD;  Location: [facility] END[facility];  Service: Endoscopy;  Laterality: N/A;  . ESOPHAGOGASTRODUODENOSCOPY (EGD) WITH PROPOFOL N/A 06/15/2018   Procedure: ESOPHAGOGASTRODUODENOSCOPY (EGD) WITH PROPOFOL;  Surgeon: Virgel Manifold, MD;  Location: [facility] ENDOSCOPY;  Service: Endoscopy;  Laterality: N/A;  . INTRAUTERINE DEVICE INSERTION  October 2015   [facility]    Prior to Admission medications   Medication Sig Start Date End Date Taking? Authorizing Provider  aluminum-magnesium hydroxide-simethicone (MAALOX) [id number] MG/5ML SUSP Take 30 mLs by mouth 4 (four) times daily -  before meals and at bedtime. 05/10/18  Yes Carrie Mew, MD  amitriptyline (ELAVIL) 10 MG tablet TAKE 1 TABLET BY MOUTH THREE TIMES A DAY 08/22/18  Yes Chrismon, Vickki Muff, PA  B-D INS SYR ULTRAFINE 1CC/31G 31G X 5/16" 1 ML MISC USE ONE SYRINGE FOR EACH DOSAGE (NOVOLOG THREE TIMES A DAY AND LANTUS ONCE A DAY) 12/10/16  Yes [provider]  clobetasol (TEMOVATE) 0.05 % external solution APPLY TWICE DAILY TO SCALP FOR [facility] 02/24/17  Yes [provider]  Clobetasol Propionate 0.05 % shampoo SHAMPOO DAILY. LEAVE IN PLACE FOR 30 MINUTES AND RINSE 02/24/17  Yes [provider]  Dapagliflozin-Metformin HCl ER 03-999 MG TB24 Take by mouth. 02/22/17  Yes [provider]  Diclofenac-miSOPROStol 75-0.2 MG TBEC TAKE 1 TABLET BY MOUTH TWICE A DAY AS NEEDED 08/18/18  Yes Chrismon, Vickki Muff, PA  diltiazem (DILACOR XR) 120 MG 24 hr capsule Take 120 mg  by mouth every morning.  12/03/14  Yes [provider]  famotidine (PEPCID) 20 MG tablet Take 1 tablet (20 mg total) by mouth 2 (two) times daily. 05/10/18  Yes Carrie Mew, MD  fluticasone (FLONASE) 50 MCG/ACT nasal spray SPRAY 2 SPRAYS IN EACH NOSTRIL DAILY Patient taking differently: Place 1-2 sprays into both nostrils daily as needed for allergies.  07/26/15  Yes Chrismon, Vickki Muff, PA  glucose blood (ACCU-CHEK ACTIVE STRIPS) test strip Use as instructed 11/02/17  Yes Chrismon, Vickki Muff, PA  Lancets (ACCU-CHEK SOFT TOUCH) lancets Use as instructed 11/02/17  Yes Chrismon, Vickki Muff, PA  methocarbamol (ROBAXIN) 500 MG tablet Take 1 tablet (500 mg total) by mouth 3 (three) times daily. 02/01/17  Yes Drinkwater, Fleeta Emmer, PA-C  montelukast (SINGULAIR) 10 MG tablet TAKE 1 TABLET BY MOUTH EVERY DAY 05/09/18  Yes Chrismon, Vickki Muff, PA  sitaGLIPtin (JANUVIA) 100 MG tablet Take by mouth. 06/01/17 06/15/18  [provider]    Allergies  Allergen Reactions  . Oxycodone Itching   Obstetric History: [id number], s/p c-section x 2  Family History  Problem Relation Age of Onset  . Coronary artery disease Mother   . Coronary artery disease Father   . Diabetes Father   . Emphysema Father   . Seizures Daughter   . Cancer Maternal Grandmother   . Breast cancer Cousin 82    Social History   Socioeconomic History  . Marital status: Widowed    Spouse name: Not on file  . Number of children: Not on file  . Years of education: Not on file  . Highest education level: Not on file  Occupational History  . Not on file  Tobacco Use  . Smoking status: Former Smoker    Years: 1.00    Quit date: 01/19/1985    Years since quitting: 34.9  . Smokeless tobacco: Never Used  Substance and Sexual Activity  . Alcohol use: No    Alcohol/week: 0.0 standard drinks    Comment: occasionally 1 beer every 6 months  . Drug use: No  . Sexual activity: Yes    Birth control/protection: I.U.D.  Other Topics Concern  . Not on file  Social History Narrative  . Not on file   Social Determinants of Health   Financial Resource Strain:   . Difficulty of Paying Living Expenses: Not on file  Food Insecurity:   . Worried About Charity fundraiser in the Last Year: Not on file  . Ran Out of Food in the Last Year: Not on file  Transportation Needs:   . Lack of Transportation (Medical): Not on file   . Lack of Transportation (Non-Medical): Not on file  Physical Activity:   . Days of Exercise per Week: Not on file  . Minutes of Exercise per Session: Not on file  Stress:   . Feeling of Stress : Not on file  Social Connections:   . Frequency of Communication with Friends and Family: Not on file  . Frequency of Social Gatherings with Friends and Family: Not on file  . Attends Religious Services: Not on file  . Active Member of Clubs or Organizations: Not on file  . Attends Archivist Meetings: Not on file  . Marital Status: Not on file  Intimate Partner Violence:   . Fear of Current or Ex-Partner: Not on file  . Emotionally Abused: Not on file  . Physically Abused: Not on file  . Sexually Abused: Not on file  Review of Systems  Constitutional: Negative.        Hot flashes  HENT: Negative.   Eyes: Negative.   Respiratory: Negative.   Cardiovascular: Negative.   Gastrointestinal: Negative.   Genitourinary: Negative.   Musculoskeletal: Negative.   Skin: Negative.   Neurological: Negative.   Psychiatric/Behavioral: Negative.      Physical Exam BP 118/74   Ht 5' (1.524 m)   Wt 215 lb (97.5 kg)   BMI 41.99 kg/m   Physical Exam Constitutional:      General: She is not in acute distress.    Appearance: She is well-developed.  Genitourinary:     Pelvic exam was performed with patient in the lithotomy position.     Uterus normal.     No signs of injury in the vagina.     No vaginal discharge, erythema, tenderness or bleeding.     No cervical motion tenderness, discharge, lesion or polyp.     IUD strings visualized.     Uterus is mobile.     Uterus is not enlarged or tender.     No uterine mass detected.    No right or left adnexal mass present.     Right adnexa not tender or full.     Left adnexa not tender or full.     Genitourinary Comments: Exam limited by body habitus  HENT:     Head: Normocephalic and atraumatic.  Eyes:     General: No scleral  icterus. Neck:     Thyroid: No thyromegaly.  Cardiovascular:     Rate and Rhythm: Normal rate and regular rhythm.     Heart sounds: No murmur. No friction rub. No gallop.   Pulmonary:     Effort: Pulmonary effort is normal. No respiratory distress.     Breath sounds: Normal breath sounds. No wheezing or rales.  Chest:     Breasts:        Right: No inverted nipple, mass, nipple discharge, skin change or tenderness.        Left: No inverted nipple, mass, nipple discharge, skin change or tenderness.  Abdominal:     General: Bowel sounds are normal. There is no distension.     Palpations: Abdomen is soft. There is no mass.     Tenderness: There is no abdominal tenderness. There is no guarding or rebound.  Musculoskeletal:        General: No tenderness. Normal range of motion.     Cervical back: Normal range of motion and neck supple.  Lymphadenopathy:     Cervical: No cervical adenopathy.     Lower Body: No right inguinal adenopathy. No left inguinal adenopathy.  Neurological:     Mental Status: She is alert and oriented to person, place, and time.     Cranial Nerves: No cranial nerve deficit.  Skin:    General: Skin is warm and dry.     Findings: No erythema or rash.  Psychiatric:        Behavior: Behavior normal.        Judgment: Judgment normal.    Female chaperone present for pelvic and breast  portions of the physical exam  Results: AUDIT Questionnaire (screen for alcoholism): 2 PHQ-9: 1  Assessment: 54 y.o. [id number] female here for routine gynecologic examination.  Plan: Problem List Items Addressed This Visit      Other   Women's annual routine gynecological examination - Primary    Other Visit Diagnoses    Screening for depression  Screening for alcoholism       Hot flashes       Relevant Medications   PARoxetine (PAXIL) 10 MG tablet      Screening: -- Blood pressure screen managed by PCP -- Colonoscopy - I encouraged her to call her  gastroenterologist to find out if she is due for a repeat colonoscopy given the report recommendation. -- Mammogram - not due -- Weight screening: obese: discussed management options, including lifestyle, dietary, and exercise. -- Depression screening negative (PHQ-9) -- Nutrition: normal -- cholesterol screening: per PCP -- osteoporosis screening: not due -- tobacco screening: not using -- alcohol screening: AUDIT questionnaire indicates low-risk usage. -- family history of breast cancer screening: done. not at high risk. -- no evidence of domestic violence or intimate partner violence. -- STD screening: gonorrhea/chlamydia NAAT not collected per patient request. -- pap smear not collected per ASCCP guidelines -- flu vaccine did not get this year yet.   Declines.  -- HPV vaccination series: not eligilbe  Hot flashes: will try paroxetine at bedtime. Discussed a course of at least 3-6 months. If no improvement, may consider gabapentin. Not a good candidate for estrogen. Discussed rationale for this.   Return in about 1 year (around 12/06/2020) for Annual Gynecologic Examination/ IUD removal.   Prentice Docker, MD 12/07/2019 3:07 PM

## 2019-12-26 ENCOUNTER — Other Ambulatory Visit: Payer: Self-pay | Admitting: Physician Assistant

## 2019-12-26 DIAGNOSIS — M62830 Muscle spasm of back: Secondary | ICD-10-CM

## 2019-12-26 NOTE — Telephone Encounter (Signed)
Requested medication (s) are due for refill today: yes  Requested medication (s) are on the active medication list: yes  Last refill:  12/04/19  Future visit scheduled: no  Notes to clinic: no valid encounter within last 6 months    Requested Prescriptions  Pending Prescriptions Disp Refills   cyclobenzaprine (FLEXERIL) 5 MG tablet [Pharmacy Med Name: CYCLOBENZAPRINE 5 MG TABLET] 30 tablet 1    Sig: TAKE 1 TABLET BY MOUTH THREE TIMES A DAY AS NEEDED FOR MUSCLE SPASMS      Not Delegated - Analgesics:  Muscle Relaxants Failed - 12/26/2019 10:41 AM      Failed - This refill cannot be delegated      Failed - Valid encounter within last 6 months    Recent Outpatient Visits           6 months ago Muscle spasm of back   [place], PA-C   7 months ago Fatigue, unspecified type   [place], [place]   9 months ago Subacute pansinusitis   [facility], Vickki Muff, [place]   1 year ago [place], Kirstie Peri, MD   1 year ago Breast wound, left, sequela   [facility] [place], [place], [place]

## 2019-12-29 ENCOUNTER — Other Ambulatory Visit: Payer: Self-pay | Admitting: Obstetrics and Gynecology

## 2019-12-29 DIAGNOSIS — R232 Flushing: Secondary | ICD-10-CM

## 2019-12-29 NOTE — Telephone Encounter (Signed)
Advise

## 2020-01-19 ENCOUNTER — Other Ambulatory Visit: Payer: Self-pay | Admitting: Family Medicine

## 2020-01-19 DIAGNOSIS — J4520 Mild intermittent asthma, uncomplicated: Secondary | ICD-10-CM

## 2020-01-19 NOTE — Telephone Encounter (Signed)
Requested Prescriptions  Pending Prescriptions Disp Refills  . montelukast (SINGULAIR) 10 MG tablet [Pharmacy Med Name: MONTELUKAST SOD 10 MG TABLET] 90 tablet 1    Sig: TAKE 1 TABLET BY MOUTH EVERY DAY     Pulmonology:  Leukotriene Inhibitors Passed - 01/19/2020  6:21 AM      Passed - Valid encounter within last 12 months    Recent Outpatient Visits          7 months ago Muscle spasm of back   [place], [place]   8 months ago Fatigue, unspecified type   [facility], [place], [place]   10 months ago Subacute pansinusitis   [facility], Vickki Muff, [place]   1 year ago [place], Donald E, MD   1 year ago Breast wound, left, sequela   [place], [place], [place]

## 2020-02-05 ENCOUNTER — Ambulatory Visit: Admission: RE | Admit: 2020-02-05 | Payer: 59 | Source: Ambulatory Visit | Admitting: *Deleted

## 2020-02-05 ENCOUNTER — Ambulatory Visit (INDEPENDENT_AMBULATORY_CARE_PROVIDER_SITE_OTHER): Payer: 59 | Admitting: Family Medicine

## 2020-02-05 ENCOUNTER — Ambulatory Visit
Admission: RE | Admit: 2020-02-05 | Discharge: 2020-02-05 | Disposition: A | Discharge status: Home / Self Care | Payer: 59 | Source: Ambulatory Visit | Attending: Family Medicine | Admitting: Family Medicine

## 2020-02-05 ENCOUNTER — Encounter: Payer: Self-pay | Admitting: Family Medicine

## 2020-02-05 ENCOUNTER — Ambulatory Visit: Payer: No Typology Code available for payment source | Admitting: Family Medicine

## 2020-02-05 ENCOUNTER — Other Ambulatory Visit: Payer: Self-pay

## 2020-02-05 VITALS — BP 115/79 | HR 88 | Temp 97.1°F | Resp 16 | Wt 217.0 lb

## 2020-02-05 DIAGNOSIS — M62830 Muscle spasm of back: Secondary | ICD-10-CM | POA: Diagnosis not present

## 2020-02-05 DIAGNOSIS — M62838 Other muscle spasm: Secondary | ICD-10-CM | POA: Diagnosis not present

## 2020-02-05 DIAGNOSIS — R252 Cramp and spasm: Secondary | ICD-10-CM

## 2020-02-05 DIAGNOSIS — M25552 Pain in left hip: Secondary | ICD-10-CM

## 2020-02-05 MED ORDER — CYCLOBENZAPRINE HCL 5 MG PO TABS: 5.0000 mg | ORAL_TABLET | Freq: Three times a day (TID) | ORAL | 3 refills | Status: DC | PRN

## 2020-02-05 MED ORDER — MELOXICAM 15 MG PO TABS: 15.0000 mg | ORAL_TABLET | Freq: Every day | ORAL | 2 refills | Status: DC

## 2020-02-05 NOTE — Assessment & Plan Note (Addendum)
Pt with surgical history of cervical spine fusion Pain and spasm in the upper back and left forearm Pt taking meloxicam and cyclobenziprine Pt with new symptoms of spasms in the left forearm  Plan: Continue taking meloxicam and cyclobenziprine as prescribed Referral to Neurology for Nerve conduction study Check B12, TSH, CBC, CMP

## 2020-02-05 NOTE — Assessment & Plan Note (Signed)
Pt with new left hip pain that does not radiate Suspicious of arthritis  Plan: Will get a left hip x-ray to evaluate for osteoarthritis

## 2020-02-05 NOTE — Progress Notes (Addendum)
Patient: Kelly Matthews Female    DOB: Sep 23, 1966   54 y.o.   MRN: [id number] Visit Date: 02/05/2020  Today's Provider: Lavon Paganini, MD   Chief Complaint  Patient presents with  . Back Pain   Subjective:     HPI  New Visit for Back Pain  Pt with a surgical history of anterior cervical spine fusion. Pt works at [employer] and her work requires her to carry things  She feels that condition is Worse. Pt attributes some symptoms due to not being able to refill cyclobenzoprine over the last 2 weeks She is not having side effects.  Back Pain lower cervical spine and upper thoracic spine  Pain radiates across both shoulder blades  Pain is sharp, 9/10 at its worse; 5/10 at baseline  Pt states that standing all day at work makes it worse  Pt states that a warm bath helps with the pain  Pt takes meloxicam and cyclobenzeprin and states that they help with her pain  Pt states pain is exactly the same as before surgury Pt states she has muscle spasm on the left forearm on the extensor muscle group.  Pt states that muscle spasm started after surgery  Pt states weakness in carrying objects at work  Sympantoms come and go  Pt states symptoms have worsened in the last 6 months  Pt states cyclobenziprin helps Pt states she has muscle spasm in her right hand and hand gets "stuck" in extension  Pt states that symptoms are alleviated with manual massage  Symptoms started in the last 3 months and has been getting worse  Pt states meloxicam and cyclobenziprin does not help Pt states pain in the left hip  Onset 6-7 months ago  Sharp pain that comes and goes  Worse when sitting for a long period of time, walking, getting dressed  Warm baths help with symptoms  Pain does not radiate  Meloxicam and cyclobenziprin work to help pain    ------------------------------------------------------------------------------------   Allergies  Allergen Reactions  . Oxycodone Itching    . Penicillins Itching     Current Outpatient Medications:  .  clobetasol (TEMOVATE) 0.05 % external solution, APPLY TWICE DAILY TO SCALP FOR ITCH, Disp: , Rfl: 4 .  Clobetasol Propionate 0.05 % shampoo, SHAMPOO DAILY. LEAVE IN PLACE FOR 30 MINUTES AND RINSE, Disp: , Rfl: 3 .  cyclobenzaprine (FLEXERIL) 5 MG tablet, TAKE 1 TABLET BY MOUTH THREE TIMES A DAY AS NEEDED FOR MUSCLE SPASMS, Disp: 30 tablet, Rfl: 1 .  Dapagliflozin-Metformin HCl ER 03-999 MG TB24, Take by mouth., Disp: , Rfl:  .  diltiazem (DILACOR XR) 120 MG 24 hr capsule, Take 120 mg by mouth every morning. , Disp: , Rfl:  .  fluticasone (FLONASE) 50 MCG/ACT nasal spray, SPRAY 2 SPRAYS IN EACH NOSTRIL DAILY, Disp: 16 g, Rfl: 5 .  meloxicam (MOBIC) 15 MG tablet, TAKE 1 TABLET BY MOUTH EVERY DAY, Disp: 30 tablet, Rfl: 0 .  montelukast (SINGULAIR) 10 MG tablet, TAKE 1 TABLET BY MOUTH EVERY DAY, Disp: 90 tablet, Rfl: 1 .  PARoxetine (PAXIL) 10 MG tablet, TAKE 1 TABLET BY MOUTH EVERYDAY AT BEDTIME, Disp: 90 tablet, Rfl: 4 .  rOPINIRole (REQUIP) 0.25 MG tablet, TAKE 1-2 TABS AT BEDTIME AS DIRECTED, Disp: , Rfl:  .  telmisartan (MICARDIS) 40 MG tablet, Take 40 mg by mouth daily., Disp: , Rfl:  .  glucose blood (ACCU-CHEK ACTIVE STRIPS) test strip, Use as instructed, Disp: 300 each, Rfl: 3 .  Lancets (ACCU-CHEK SOFT TOUCH) lancets, Use as instructed, Disp: 300 each, Rfl: 3  Review of Systems  Constitutional: Negative.   HENT: Negative.   Eyes: Negative.   Respiratory: Negative.   Cardiovascular: Negative.   Gastrointestinal: Negative.   Endocrine: Negative.   Genitourinary: Negative.   Musculoskeletal: Positive for arthralgias and back pain.  Skin: Negative.   Allergic/Immunologic: Negative.   Neurological: Negative.   Hematological: Negative.   Psychiatric/Behavioral: Negative.     Social History   Tobacco Use  . Smoking status: Former Smoker    Years: 1.00    Quit date: 01/19/1985    Years since quitting: 35.0  .  Smokeless tobacco: Never Used  Substance Use Topics  . Alcohol use: No    Alcohol/week: 0.0 standard drinks    Comment: occasionally 1 beer every 6 months      Objective:   BP 115/79 (BP Location: Right Arm, Patient Position: Sitting, Cuff Size: Large)   Pulse 88   Temp (!) 97.1 F (36.2 C) (Temporal)   Resp 16   Wt 217 lb (98.4 kg)   BMI 42.38 kg/m  Vitals:   02/05/20 1439  BP: 115/79  Pulse: 88  Resp: 16  Temp: (!) 97.1 F (36.2 C)  TempSrc: Temporal  Weight: 217 lb (98.4 kg)  Body mass index is 42.38 kg/m.   Physical Exam Constitutional:      Appearance: Normal appearance.  HENT:     Head: Normocephalic and atraumatic.  Neck:     Comments: Decreased range of motion in upward extension Pain with neck movement in all directions  Cardiovascular:     Rate and Rhythm: Normal rate and regular rhythm.     Pulses: Normal pulses.     Heart sounds: Normal heart sounds.  Pulmonary:     Effort: Pulmonary effort is normal.     Breath sounds: Normal breath sounds.  Musculoskeletal:     Comments: Normal range of motion in all upper extremity joints bilaterally Pain in shoulder blades along distribution of mid scapula bilaterally Pain with left arm flexion in the flexor compartment Pain in the left groin with movement   Skin:    General: Skin is warm and dry.  Neurological:     Mental Status: She is alert.  Psychiatric:        Mood and Affect: Mood normal.        Behavior: Behavior normal.    No loss of internal or external rotation of the left hip.  No tenderness to palpation of the left hip along greater trochanteric bursa, SI joint, or other bony landmarks.  Pain with resisted flexion of the left wrist.  No effusions or joint abnormalities noted.  Negative Spurling's bilaterally of the neck.  No results found for any visits on 02/05/20.     Assessment & Plan    Problem List Items Addressed This Visit      Other   Muscle spasm    Pt with surgical history  of cervical spine fusion Pain and spasm in the upper back and left forearm Pt taking meloxicam and cyclobenziprine Pt with new symptoms of spasms in the left forearm  Plan: Continue taking meloxicam and cyclobenziprine as prescribed Referral to Neurology for Nerve conduction study Check B12, TSH, CBC, CMP        Relevant Medications   cyclobenzaprine (FLEXERIL) 5 MG tablet   meloxicam (MOBIC) 15 MG tablet   Other Relevant Orders   Ambulatory referral to Neurology   TSH  Comprehensive metabolic panel   CBC   123456   Muscle cramp - Primary    Pt with surgical history of cervical spine fusion Pt with new symptoms of right hand cramping  Plan: Continue taking meloxicam and cyclobenziprine as prescribed Referral to Neurology for Nerve conduction study Check B12, TSH, CBC, CMP       Relevant Orders   TSH   Comprehensive metabolic panel   CBC   [id number]   Left hip pain    Pt with new left hip pain that does not radiate Suspicious of arthritis  Plan: Will get a left hip x-ray to evaluate for osteoarthritis       Relevant Orders   DG Hip Unilat W OR W/O Pelvis 2-3 Views Left     Concern for possible myositis or neuropathy causing the recurrent muscle spasms in her forearms and hands after cervical spine fusion.  She was recommended to get MRI by neurosurgery per patient report, but was unable to have this approved by insurance.  I think that she could benefit from evaluation by neurology with possible EMG/NCS for it.  In the meantime, continue meloxicam and Flexeril.  We will check labs for underlying etiologies.    Chipper Herb, Medical Student [employer]

## 2020-02-05 NOTE — Assessment & Plan Note (Signed)
Pt with surgical history of cervical spine fusion Pt with new symptoms of right hand cramping  Plan: Continue taking meloxicam and cyclobenziprine as prescribed Referral to Neurology for Nerve conduction study Check B12, TSH, CBC, CMP

## 2020-02-06 ENCOUNTER — Telehealth: Payer: Self-pay

## 2020-02-06 LAB — TSH: TSH: 2.18 u[IU]/mL (ref 0.450–4.500)

## 2020-02-06 LAB — COMPREHENSIVE METABOLIC PANEL
ALT: 50 IU/L — ABNORMAL HIGH (ref 0–32)
AST: 28 IU/L (ref 0–40)
Albumin/Globulin Ratio: 2 (ref 1.2–2.2)
Albumin: 4.8 g/dL (ref 3.8–4.9)
Alkaline Phosphatase: 127 IU/L — ABNORMAL HIGH (ref 39–117)
BUN/Creatinine Ratio: 15 (ref 9–23)
BUN: 9 mg/dL (ref 6–24)
Bilirubin Total: 0.3 mg/dL (ref 0.0–1.2)
CO2: 22 mmol/L (ref 20–29)
Calcium: 9.8 mg/dL (ref 8.7–10.2)
Chloride: 101 mmol/L (ref 96–106)
Creatinine, Ser: 0.62 mg/dL (ref 0.57–1.00)
GFR calc Af Amer: 120 mL/min/{1.73_m2} (ref >59)
GFR calc non Af Amer: 104 mL/min/{1.73_m2} (ref >59)
Globulin, Total: 2.4 g/dL (ref 1.5–4.5)
Glucose: 103 mg/dL — ABNORMAL HIGH (ref 65–99)
Potassium: 3.9 mmol/L (ref 3.5–5.2)
Sodium: 137 mmol/L (ref 134–144)
Total Protein: 7.2 g/dL (ref 6.0–8.5)

## 2020-02-06 LAB — CBC
Hematocrit: 42.4 % (ref 34.0–46.6)
Hemoglobin: 14.6 g/dL (ref 11.1–15.9)
MCH: 27.6 pg (ref 26.6–33.0)
MCHC: 34.4 g/dL (ref 31.5–35.7)
MCV: 80 fL (ref 79–97)
Platelets: 203 10*3/uL (ref 150–450)
RBC: 5.29 x10E6/uL — ABNORMAL HIGH (ref 3.77–5.28)
RDW: 13.3 % (ref 11.7–15.4)
WBC: 10.9 10*3/uL — ABNORMAL HIGH (ref 3.4–10.8)

## 2020-02-06 LAB — VITAMIN B12: Vitamin B-12: 1047 pg/mL (ref 232–1245)

## 2020-02-06 NOTE — Telephone Encounter (Signed)
-----   Message from Virginia Crews, MD sent at 02/06/2020  8:21 AM EDT ----- Normal hip Xray

## 2020-02-06 NOTE — Telephone Encounter (Signed)
-----  Message from Virginia Crews, MD sent at 02/06/2020  8:21 AM EDT ----- Normal labs, except for elevated Alk phos and WBC. Can we please add on differential to the CBC and creatinine kinase (CK).

## 2020-02-06 NOTE — Telephone Encounter (Signed)
   Comments to Patient Seen by patient Kelly Matthews on 02/06/2020 8:28 AM EDT

## 2020-02-08 ENCOUNTER — Telehealth: Payer: Self-pay

## 2020-02-08 DIAGNOSIS — D72829 Elevated white blood cell count, unspecified: Secondary | ICD-10-CM

## 2020-02-08 LAB — CBC WITH DIFFERENTIAL/PLATELET
Basophils Absolute: 0.1 10*3/uL (ref 0.0–0.2)
Basos: 1 %
EOS (ABSOLUTE): 0.1 10*3/uL (ref 0.0–0.4)
Eos: 1 %
Hematocrit: 43.2 % (ref 34.0–46.6)
Hemoglobin: 14.4 g/dL (ref 11.1–15.9)
Immature Grans (Abs): 0.1 10*3/uL (ref 0.0–0.1)
Immature Granulocytes: 1 %
Lymphocytes Absolute: 2.6 10*3/uL (ref 0.7–3.1)
Lymphs: 23 %
MCH: 27.4 pg (ref 26.6–33.0)
MCHC: 33.3 g/dL (ref 31.5–35.7)
MCV: 82 fL (ref 79–97)
Monocytes Absolute: 0.5 10*3/uL (ref 0.1–0.9)
Monocytes: 4 %
Neutrophils Absolute: 7.7 10*3/uL — ABNORMAL HIGH (ref 1.4–7.0)
Neutrophils: 70 %
Platelets: 208 10*3/uL (ref 150–450)
RBC: 5.26 x10E6/uL (ref 3.77–5.28)
RDW: 13.4 % (ref 11.7–15.4)
WBC: 10.9 10*3/uL — ABNORMAL HIGH (ref 3.4–10.8)

## 2020-02-08 LAB — SPECIMEN STATUS REPORT

## 2020-02-08 LAB — CK: Total CK: 113 U/L (ref 32–182)

## 2020-02-08 NOTE — Telephone Encounter (Signed)
Pt advised.   Thanks,   -Atreyu Mak  

## 2020-02-08 NOTE — Telephone Encounter (Signed)
-----   Message from Virginia Crews, MD sent at 02/08/2020  9:55 AM EDT ----- CK (muscle enzyme) is normal.  High neutrophil count.  Repeat CBC with diff in one month

## 2020-03-18 ENCOUNTER — Telehealth: Payer: Self-pay

## 2020-03-18 NOTE — Telephone Encounter (Signed)
Visit was 6 weeks ago, so cannot write an indefinite out of work letter at this time.  May need re-eval if still hurting and could discuss work note at that time.  Still unlikely to write indefinite letter, fyi.  Ok to switch PCP, but may warn her that my availability is not as open as Dennis's.

## 2020-03-18 NOTE — Telephone Encounter (Signed)
Copied from [place] [phone]. Topic: General - Inquiry >> Mar 18, 2020 10:55 AM Mathis Bud wrote: Reason for CRM: Patient called stating she would like Dr.B to be her new primary care. Patient saw Dr.B her last visit and would like to keep her.  Patient is also request a out of work letter, patient back is hurting still. Patient has seen Dr.B regarding this.  Patient had a referral for neuro that she say this morning, but patient states that neuro couldn't write her an out of work [employer].  Patient would like letter to states to further notice. Call back [phone]

## 2020-03-20 ENCOUNTER — Other Ambulatory Visit: Payer: Self-pay | Admitting: Neurology

## 2020-03-20 ENCOUNTER — Other Ambulatory Visit: Payer: Self-pay

## 2020-03-20 ENCOUNTER — Encounter: Payer: Self-pay | Admitting: Family Medicine

## 2020-03-20 ENCOUNTER — Ambulatory Visit (INDEPENDENT_AMBULATORY_CARE_PROVIDER_SITE_OTHER): Payer: 59 | Admitting: Family Medicine

## 2020-03-20 VITALS — BP 102/70 | HR 102 | Temp 97.1°F | Resp 16 | Ht 60.0 in | Wt 217.8 lb

## 2020-03-20 DIAGNOSIS — M62830 Muscle spasm of back: Secondary | ICD-10-CM | POA: Diagnosis not present

## 2020-03-20 DIAGNOSIS — R252 Cramp and spasm: Secondary | ICD-10-CM

## 2020-03-20 DIAGNOSIS — M5442 Lumbago with sciatica, left side: Secondary | ICD-10-CM

## 2020-03-20 DIAGNOSIS — M5412 Radiculopathy, cervical region: Secondary | ICD-10-CM

## 2020-03-20 DIAGNOSIS — G8929 Other chronic pain: Secondary | ICD-10-CM

## 2020-03-20 NOTE — Assessment & Plan Note (Signed)
Persistent pain Patient advised to continue Meloxicam and cyclobenziprine

## 2020-03-20 NOTE — Assessment & Plan Note (Signed)
Recurrent, worsening Persistent pain and spasm in the upper back and radiating to left arm Patient is taking meloxicam and cyclobenziprine Patient has been seen with neurology Patient is waiting on an approval for an MRI

## 2020-03-20 NOTE — Progress Notes (Signed)
Established patient visit   Patient: Kelly Matthews   DOB: Jul 01, 1966   54 y.o. Female  MRN: [id number] Visit Date: 03/20/2020  I,Sulibeya S Dimas,acting as a scribe for Lavon Paganini, MD.,have documented all relevant documentation on the behalf of Lavon Paganini, MD,as directed by  Lavon Paganini, MD while in the presence of Lavon Paganini, MD.  Today's healthcare provider: Lavon Paganini, MD   Chief Complaint  Patient presents with  . Back Pain   Subjective    Back Pain This is a recurrent problem. The current episode started more than 1 month ago. The problem occurs constantly. The problem has been gradually worsening since onset. The pain is present in the lumbar spine and thoracic spine. Radiates to: left arm and left hip. The pain is at a severity of 9/10. The pain is severe. The pain is the same all the time. The symptoms are aggravated by standing, twisting and sitting. Associated symptoms include leg pain, paresis and weakness. Pertinent negatives include no abdominal pain, bladder incontinence, bowel incontinence, dysuria, fever, headaches, numbness, paresthesias, pelvic pain, perianal numbness or tingling. She has tried NSAIDs, muscle relaxant, analgesics and heat for the symptoms. The treatment provided moderate relief.     Social History   Tobacco Use  . Smoking status: Former Smoker    Years: 1.00    Quit date: 01/19/1985    Years since quitting: 35.1  . Smokeless tobacco: Never Used  Substance Use Topics  . Alcohol use: No    Alcohol/week: 0.0 standard drinks    Comment: occasionally 1 beer every 6 months  . Drug use: No       Medications: Outpatient Medications Prior to Visit  Medication Sig  . clobetasol (TEMOVATE) 0.05 % external solution APPLY TWICE DAILY TO SCALP FOR ITCH  . Clobetasol Propionate 0.05 % shampoo SHAMPOO DAILY. LEAVE IN PLACE FOR 30 MINUTES AND RINSE  . cyclobenzaprine (FLEXERIL) 5 MG tablet Take 1 tablet (5 mg  total) by mouth 3 (three) times daily as needed for muscle spasms.  . Dapagliflozin-Metformin HCl ER 03-999 MG TB24 Take by mouth.  . diltiazem (DILACOR XR) 120 MG 24 hr capsule Take 120 mg by mouth every morning.   . fluticasone (FLONASE) 50 MCG/ACT nasal spray SPRAY 2 SPRAYS IN EACH NOSTRIL DAILY  . gabapentin (NEURONTIN) 300 MG capsule Take by mouth.  Marland Kitchen glucose blood (ACCU-CHEK ACTIVE STRIPS) test strip Use as instructed  . Lancets (ACCU-CHEK SOFT TOUCH) lancets Use as instructed  . meloxicam (MOBIC) 15 MG tablet Take 1 tablet (15 mg total) by mouth daily.  . montelukast (SINGULAIR) 10 MG tablet TAKE 1 TABLET BY MOUTH EVERY DAY  . PARoxetine (PAXIL) 10 MG tablet TAKE 1 TABLET BY MOUTH EVERYDAY AT BEDTIME  . rOPINIRole (REQUIP) 0.25 MG tablet TAKE 1-2 TABS AT BEDTIME AS DIRECTED  . telmisartan (MICARDIS) 40 MG tablet Take 40 mg by mouth daily.   No facility-administered medications prior to visit.    Review of Systems  Constitutional: Negative for fever.  Gastrointestinal: Negative for abdominal pain and bowel incontinence.  Genitourinary: Negative for bladder incontinence, dysuria and pelvic pain.  Musculoskeletal: Positive for back pain.  Neurological: Positive for weakness. Negative for tingling, numbness, headaches and paresthesias.    Last CBC Lab Results  Component Value Date   WBC 10.9 (H) 02/05/2020   WBC 10.9 (H) 02/05/2020   HGB 14.6 02/05/2020   HGB 14.4 02/05/2020   HCT 42.4 02/05/2020   HCT 43.2 02/05/2020  MCV 80 02/05/2020   MCV 82 02/05/2020   MCH 27.6 02/05/2020   MCH 27.4 02/05/2020   RDW 13.3 02/05/2020   RDW 13.4 02/05/2020   PLT 203 02/05/2020   PLT 208 [id number]   Last metabolic panel Lab Results  Component Value Date   GLUCOSE 103 (H) 02/05/2020   NA 137 02/05/2020   K 3.9 02/05/2020   CL 101 02/05/2020   CO2 22 02/05/2020   BUN 9 02/05/2020   CREATININE 0.62 02/05/2020   GFRNONAA 104 02/05/2020   GFRAA 120 02/05/2020   CALCIUM 9.8  02/05/2020   PHOS 2.2 (L) 12/07/2016   PROT 7.2 02/05/2020   ALBUMIN 4.8 02/05/2020   LABGLOB 2.4 02/05/2020   AGRATIO 2.0 02/05/2020   BILITOT 0.3 02/05/2020   ALKPHOS 127 (H) 02/05/2020   AST 28 02/05/2020   ALT 50 (H) 02/05/2020   ANIONGAP 10 03/16/2019      Objective    BP 102/70 (BP Location: Right Arm, Patient Position: Sitting, Cuff Size: Large)   Pulse (!) 102   Temp (!) 97.1 F (36.2 C) (Temporal)   Resp 16   Ht 5' (1.524 m)   Wt 217 lb 12.8 oz (98.8 kg)   BMI 42.54 kg/m  BP Readings from Last 3 Encounters:  03/20/20 102/70  02/05/20 115/79  12/07/19 118/74   Wt Readings from Last 3 Encounters:  03/20/20 217 lb 12.8 oz (98.8 kg)  02/05/20 217 lb (98.4 kg)  12/07/19 215 lb (97.5 kg)      Physical Exam Constitutional:      Appearance: Normal appearance.  HENT:     Head: Normocephalic and atraumatic.  Neck:     Comments: Decreased range of motion in upward extension Pain with neck movement in all directions  Cardiovascular:     Rate and Rhythm: Normal rate and regular rhythm.     Pulses: Normal pulses.     Heart sounds: Normal heart sounds.  Pulmonary:     Effort: Pulmonary effort is normal.     Breath sounds: Normal breath sounds.  Musculoskeletal:     Right lower leg: No edema.     Left lower leg: No edema.     Comments: Normal range of motion in all upper extremity joints bilaterally Pain in shoulder blades along distribution of mid scapula bilaterally Back: No midline TTP, ROM grossly intact.  Mild TTP over R lower back/SI joint. Negative SLR bilaterally.  Strength and sensation to light touch intact in lower extremities.   Skin:    General: Skin is warm and dry.     Findings: No rash.  Neurological:     Mental Status: She is alert and oriented to person, place, and time. Mental status is at baseline.  Psychiatric:        Mood and Affect: Mood normal.        Behavior: Behavior normal.     No results found for any visits on 03/20/20.   Assessment & Plan     Problem List Items Addressed This Visit      Nervous and Auditory   Chronic bilateral low back pain with left-sided sciatica    Discussed that will not extend leave from work past the 1 week that she has already been taken out by podiatry.  Discussed that more objective data, like MRI results, would be needed to take her out from work for longer period of time.  May also need to consider pain management in the future.  Advised PT,  but this is cost prohibitive currently, so gave HEP      Relevant Medications   gabapentin (NEURONTIN) 300 MG capsule   Other Relevant Orders   AMB referral to orthopedics     Other   Muscle spasm of back - Primary    Recurrent, worsening Persistent pain and spasm in the upper back and radiating to left arm Patient is taking meloxicam and cyclobenziprine Patient has been seen with neurology Patient is waiting on an approval for an MRI      Muscle cramp    Persistent pain Patient advised to continue Meloxicam and cyclobenziprine       .   Return if symptoms worsen or fail to improve.      I, Lavon Paganini, MD, have reviewed all documentation for this visit. The documentation on 03/20/20 for the exam, diagnosis, procedures, and orders are all accurate and complete.   Shakila Mak, Dionne Bucy, MD, MPH [facility]

## 2020-03-20 NOTE — Assessment & Plan Note (Signed)
Discussed that will not extend leave from work past the 1 week that she has already been taken out by podiatry.  Discussed that more objective data, like MRI results, would be needed to take her out from work for longer period of time.  May also need to consider pain management in the future.  Advised PT, but this is cost prohibitive currently, so gave HEP

## 2020-03-22 NOTE — Telephone Encounter (Signed)
Left detailed message for patient.

## 2020-03-22 NOTE — Telephone Encounter (Signed)
Pt tried to get Dr Manuella Ghazi to write her out until the MRI that is scheduled 03/31/2020.  She was advised to contact her PCP for the work note.   Thanks,   -Mickel Baas

## 2020-03-22 NOTE — Telephone Encounter (Signed)
Sure. We can give work note to return 5/17

## 2020-03-22 NOTE — Telephone Encounter (Signed)
Patient requesting to speak with CMA or PCP regarding note. She is requesting a letter until the 16th of May when she will be getting an MRI.

## 2020-03-22 NOTE — Telephone Encounter (Signed)
Note in mychart. Patient may print from [place].

## 2020-03-25 ENCOUNTER — Telehealth: Payer: Self-pay

## 2020-03-25 NOTE — Telephone Encounter (Signed)
We specifically discussed during her appt that we could not fill out disability paperwork without some facts and data.  I had recommended her seeing Ortho to discuss her back pain.  I believe referral was placed.  Will need to have specialist evaluate her to determine if disability is appropriate.

## 2020-03-25 NOTE — Telephone Encounter (Signed)
Patient states she needs disability papers filled out by Dr B for her back, so they will pay her while she is out of work.  She is also asking about an ortho referral. FYI it looks like there is a request in the chart asking if Dr Manuella Ghazi would keep her out until the 16th when she has her MRI   Copied from [place] [id number]. Topic: General - Inquiry >> Mar 25, 2020 10:12 AM Greggory Keen D wrote: Reason for CRM: Pt called asking if we received disability papers from [facility] last week.  She would like them completed and faxed back asap.  She would like someone to call her when this is done [phone].

## 2020-03-26 NOTE — Telephone Encounter (Signed)
Patient advised as below.  

## 2020-03-31 ENCOUNTER — Ambulatory Visit
Admission: RE | Admit: 2020-03-31 | Discharge: 2020-03-31 | Disposition: A | Discharge status: Home / Self Care | Payer: 59 | Source: Ambulatory Visit | Attending: Neurology | Admitting: Neurology

## 2020-03-31 ENCOUNTER — Other Ambulatory Visit: Payer: 59

## 2020-03-31 DIAGNOSIS — M5412 Radiculopathy, cervical region: Secondary | ICD-10-CM

## 2020-04-12 ENCOUNTER — Other Ambulatory Visit (HOSPITAL_COMMUNITY): Payer: Self-pay | Admitting: Orthopedic Surgery

## 2020-04-12 ENCOUNTER — Other Ambulatory Visit: Payer: Self-pay | Admitting: Orthopedic Surgery

## 2020-04-12 DIAGNOSIS — M25552 Pain in left hip: Secondary | ICD-10-CM

## 2020-04-12 DIAGNOSIS — G8929 Other chronic pain: Secondary | ICD-10-CM

## 2020-04-27 ENCOUNTER — Ambulatory Visit
Admission: RE | Admit: 2020-04-27 | Discharge: 2020-04-27 | Disposition: A | Discharge status: Home / Self Care | Payer: No Typology Code available for payment source | Source: Ambulatory Visit | Attending: Orthopedic Surgery | Admitting: Orthopedic Surgery

## 2020-04-27 DIAGNOSIS — M25552 Pain in left hip: Secondary | ICD-10-CM

## 2020-04-27 DIAGNOSIS — G8929 Other chronic pain: Secondary | ICD-10-CM | POA: Insufficient documentation

## 2020-04-27 DIAGNOSIS — M5442 Lumbago with sciatica, left side: Secondary | ICD-10-CM | POA: Diagnosis present

## 2020-04-28 ENCOUNTER — Other Ambulatory Visit: Payer: Self-pay | Admitting: Family Medicine

## 2020-04-28 DIAGNOSIS — M62830 Muscle spasm of back: Secondary | ICD-10-CM

## 2020-04-28 NOTE — Telephone Encounter (Signed)
Requested medication (s) are due for refill today: no  Requested medication (s) are on the active medication list: yes  Last refill:  04/11/2020  Future visit scheduled: no  Notes to clinic:  this refill cannot be delegated    Requested Prescriptions  Pending Prescriptions Disp Refills   cyclobenzaprine (FLEXERIL) 5 MG tablet [Pharmacy Med Name: CYCLOBENZAPRINE 5 MG TABLET] 60 tablet 3    Sig: TAKE 1 TABLET BY MOUTH THREE TIMES A DAY AS NEEDED FOR MUSCLE SPASMS      Not Delegated - Analgesics:  Muscle Relaxants Failed - 04/28/2020 12:14 PM      Failed - This refill cannot be delegated      Passed - Valid encounter within last 6 months    Recent Outpatient Visits           1 month ago Muscle spasm of back   [facility] [place], Dionne Bucy, MD   2 months ago Muscle cramp   [place], Dionne Bucy, MD   10 months ago Muscle spasm of back   [place], [place]   12 months ago Fatigue, unspecified type   [facility], Vickki Muff, [place]   1 year ago Subacute pansinusitis   [place], [place], [place]

## 2020-05-17 ENCOUNTER — Ambulatory Visit (INDEPENDENT_AMBULATORY_CARE_PROVIDER_SITE_OTHER): Payer: No Typology Code available for payment source | Admitting: Physician Assistant

## 2020-05-17 ENCOUNTER — Other Ambulatory Visit: Payer: Self-pay

## 2020-05-17 ENCOUNTER — Telehealth: Payer: Self-pay

## 2020-05-17 VITALS — Temp 97.3°F | Wt 222.4 lb

## 2020-05-17 DIAGNOSIS — G4700 Insomnia, unspecified: Secondary | ICD-10-CM

## 2020-05-17 DIAGNOSIS — M5442 Lumbago with sciatica, left side: Secondary | ICD-10-CM

## 2020-05-17 DIAGNOSIS — G8929 Other chronic pain: Secondary | ICD-10-CM | POA: Diagnosis not present

## 2020-05-17 DIAGNOSIS — K625 Hemorrhage of anus and rectum: Secondary | ICD-10-CM

## 2020-05-17 MED ORDER — TRAZODONE HCL 50 MG PO TABS: 25.0000 mg | ORAL_TABLET | Freq: Every evening | ORAL | 0 refills | Status: DC | PRN

## 2020-05-17 MED ORDER — HYDROCORTISONE (PERIANAL) 2.5 % EX CREA: 1.0000 "application " | TOPICAL_CREAM | Freq: Two times a day (BID) | CUTANEOUS | 0 refills | Status: DC

## 2020-05-17 NOTE — Progress Notes (Signed)
Established patient visit   Patient: Kelly Matthews   DOB: February 14, 1966   54 y.o. Female  MRN: [id number] Visit Date: 05/17/2020  Today's healthcare provider: Trinna Post, PA-C   Chief Complaint  Patient presents with  . Back Pain   Subjective    Back Pain This is a recurrent problem. The current episode started more than 1 month ago. The problem occurs constantly. The problem has been gradually worsening since onset. The pain is present in the lumbar spine. The quality of the pain is described as aching, stabbing and shooting. Radiates to: Left Arm. The pain is at a severity of 8/10. The pain is worse during the night. The symptoms are aggravated by lying down, position and sitting. Stiffness is present at night. Pertinent negatives include no dysuria, fever or numbness. Bowel incontinence: bleeding in stool. (Constipation) Treatments tried: Cyclobenzaprine, Meloxicam, Gabapentin. The treatment provided mild relief.   She presents today with low back pain, history as above. She has been seen for this by multiple people prior, including at this clinic. She was last seen in this clinic on 03/20/2020 by Dr. Brita Romp for the same issue and at that time work note was declined until further objective evidence could be found. She had initially been written out by her podiatrist. PT was cost prohibitive at the time and she was referred to ortho surgery. She has tried gabapentin. Intolerant of steroids.   She was seen by [facility] surgery on 04/05/2020 with the HPI listed below   SAFIYYAH Matthews is a 54 y.o. female that presents to clinic today for initial evaluation and management of low back pain and leg pain. Patient also reports history of neck pain and arm pain.   The patient reports a 6 month history of left leg and low back pain. Patient states her pain is felt across the bilateral lower back, down the left lateral thigh, and down the lateral aspect of the left  lower leg. She is not sure if there is extension into the ankle because she has also been having foot pain due to plantar fasciitis. Pain is described as achy. She states the pain is constant but worse with prolonged standing or sitting. Denies saddle anesthesia, loss bladder or bowel control, denies groin pain. She has previously been seen by her primary care provider who prescribed muscle relaxers and meloxicam. Patient reports minimal improvement in symptoms with this. 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  The patient reports a 6 month history of pain felt along the shoulder blades which extends down the left arm, past the elbow and into the forearm. Pain is also felt in the right hand. Pain is described as sharp and intermittent, occurring 3-4 times per week. Patient is status post cervical fusion of C6-C7 by Dr. Aris Lot in 2018. She last saw Dr. Aris Lot in December 2020 who ordered an MRI of the cervical spine but insurance would not approve it. Patient was later seen by Dr. Manuella Ghazi who ordered an MRI of the cervical spine, which was approved this time. Patient has not followed up with Dr. Aris Lot since December.   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 Patient requests a note excusing her from work.  She had an MRI of her C spine on 03/28/2020 and MRI of L-spine 05/02/2020. She received a  work note excusing her until her MRIs. She followed up with [facility] Dr. Aris Lot on 05/16/2020. Assessment and plan as follows:  Assessment and Plan: [name] is a pleasant 54 y.o. female with longstanding skill skeletal discomfort spanning between her shoulders as well as in her lower back which causes her most difficulty with standing for long periods of time in particular at work. She has a radiating left leg discomfort as well. She has muscle spasms in her arms  that cause them to contract at times. She is tried medicinal relief without any benefit including muscle relaxant therapy. I reviewed her MRI of her lumbar and cervical spine which does not reveal any high-grade central canal or foraminal narrowing. There is been substantial improvement at the level C6/7 where she had severe stenosis preoperatively. Her x-rays also appear stable without any evidence of hardware pullout. I reviewed these imaging findings with her and would not recommend further surgery at this time. From a surgical standpoint with no plans for surgical intervention would not refrain from keeping patient out of work but discussed with that she should follow-up with her primary providers for her work activity restrictions going forward as she is 3 years out from surgery and imaging is stable. I will place a referral to physiatry at [facility] clinic with Dr. Sharlet Salina for considerations of facet blocks to see if this will improve her symptoms.   She reports this pain continues and would like a work note excusing her from work until she sees physiatry on 05/27/2020.  Reports she is having difficulty sleeping.    HPI    Back Pain    This is a recurrent problem.  Recent episode started more than 6 months ago (Surgery in 2018).  The problem has been gradually worsening since onset.  Pain is lumbar spine.  The quality of pain is described as sharp, stabbing, tingling, throbbing and stiffness.  Severity of the pain is severe.  Pain occurs daily.  Symptoms worse in nighttime (Pain throughout the day but at nighttime is worse).  The symptoms are aggravated by walking and sitting (Sitting for a long period of time).  Symptoms are relieved by lying down and bending (Cyclobenzaprine, maloxixam, gabopentin).  Treatment provided mild relief (Improvement some but pain comes back again).  Bowel incontinence: Present (Bleeding in stool).  Chest pain:  Absent.  Dysuria: Absent.  Fever: Absent.  Headaches: Absent.   Joint pains: Present.  Weakness in leg: Absent.  Pelvic pain: Absent.  Tingling in lower extremities: Absent.  Urinary incontinence: Absent.  Weight loss: Absent.       Last edited by Fenton Malling on 05/17/2020  3:38 PM. (History)      Patient states back pain began in December 2020.      Medications: Outpatient Medications Prior to Visit  Medication Sig  . clobetasol (TEMOVATE) 0.05 % external solution APPLY TWICE DAILY TO SCALP FOR ITCH  . Clobetasol Propionate 0.05 % shampoo SHAMPOO DAILY. LEAVE IN PLACE FOR 30 MINUTES AND RINSE  . cyclobenzaprine (FLEXERIL) 5 MG tablet TAKE 1 TABLET BY MOUTH THREE TIMES A DAY AS NEEDED FOR MUSCLE SPASMS  . Dapagliflozin-Metformin HCl ER 03-999 MG TB24 Take by mouth.  . diltiazem (DILACOR XR) 120 MG 24 hr capsule Take 120 mg by mouth every morning.   . fluticasone (FLONASE) 50 MCG/ACT nasal spray SPRAY 2 SPRAYS IN EACH NOSTRIL DAILY  . glucose blood (ACCU-CHEK ACTIVE STRIPS) test strip Use as instructed  . Lancets (ACCU-CHEK SOFT TOUCH)  lancets Use as instructed  . meloxicam (MOBIC) 15 MG tablet Take 1 tablet (15 mg total) by mouth daily.  . montelukast (SINGULAIR) 10 MG tablet TAKE 1 TABLET BY MOUTH EVERY DAY  . PARoxetine (PAXIL) 10 MG tablet TAKE 1 TABLET BY MOUTH EVERYDAY AT BEDTIME  . rOPINIRole (REQUIP) 0.25 MG tablet TAKE 1-2 TABS AT BEDTIME AS DIRECTED  . telmisartan (MICARDIS) 40 MG tablet Take 40 mg by mouth daily.   No facility-administered medications prior to visit.    Review of Systems  Constitutional: Negative for fever.  Gastrointestinal: Positive for constipation. Bowel incontinence: bleeding in stool.  Genitourinary: Negative for dysuria.  Musculoskeletal: Positive for back pain.  Neurological: Negative for numbness.      Objective    Temp (!) 97.3 F (36.3 C) (Temporal)   Wt 222 lb 6.4 oz (100.9 kg)   BMI 43.43 kg/m    Physical Exam Constitutional:      General: She is not in acute distress.    Appearance:  Normal appearance.  Cardiovascular:     Rate and Rhythm: Normal rate.  Pulmonary:     Effort: Pulmonary effort is normal.  Skin:    General: Skin is warm and dry.  Neurological:     Mental Status: She is alert and oriented to person, place, and time. Mental status is at baseline.       No results found for any visits on 05/17/20.  Assessment & Plan    1. Rectal bleeding  Patient described BRB per rectum. Colonoscopy 06/15/2018 was normal however it recommended repeating 6-12 months from then. I am unsure why as patient states she does not have personal or family history. Symptoms seem consistent with hemorrhoids but will refer back for colonoscopy per GI.   - hydrocortisone (ANUSOL-HC) 2.5 % rectal cream; Place 1 application rectally 2 (two) times daily.  Dispense: 30 g; Refill: 0 - Ambulatory referral to Gastroenterology  2. Insomnia, unspecified type  Counseled if insomnia is due to pain, the main objective would be to treat pain. She has physiatry appointment on 05/27/2020. Nevertheless, trial of trazodone.   - traZODone (DESYREL) 50 MG tablet; Take 0.5-1 tablets (25-50 mg total) by mouth at bedtime as needed for sleep.  Dispense: 30 tablet; Refill: 0  3. Chronic bilateral low back pain with left-sided sciatica  Post surgical changes on MRI c spine but no compressive pathology. MRI L-spine shows mild DDD with no canal stenosis, not consistent with her symptoms and previous specialists have counseled they do not think her symptoms are consistent with bony pathology. I do not have any objective reason to keep her out of work. She asks for work note until physiatry but again I do not have objective findings to justify this and I have declined work note. Wrote note to return to work.     Return if symptoms worsen or fail to improve.      ITrinna Post, PA-C, have reviewed all documentation for this visit. The documentation on 05/21/20 for the exam, diagnosis, procedures, and  orders are all accurate and complete.  I have spent 25 minutes with this patient, >50% of which was spent on counseling and coordination of care.    Paulene Floor  [facility] [phone] (phone) [phone] (fax)  [place]

## 2020-05-17 NOTE — Telephone Encounter (Signed)
Patient was advised and states she received letter.

## 2020-05-17 NOTE — Patient Instructions (Signed)
Rectal Bleeding  Rectal bleeding is when blood comes out of the opening of the butt (anus). People with this kind of bleeding may notice bright red blood in their underwear or in the toilet after they poop (have a bowel movement). They may also have dark red or black poop (stool). Rectal bleeding is often a sign that something is wrong. It needs to be checked by a doctor. Follow these instructions at home: Watch for any changes in your condition. Take these actions to help with bleeding and discomfort:  Eat a diet that is high in fiber. This will keep your poop soft so it is easier for you to poop without pushing too hard. Ask your doctor to tell you what foods and drinks are high in fiber.  Drink enough fluid to keep your pee (urine) clear or pale yellow. This also helps keep your poop soft.  Try taking a warm bath. This may help with pain.  Keep all follow-up visits as told by your doctor. This is important. Get help right away if:  You have new bleeding.  You have more bleeding than before.  You have black or dark red poop.  You throw up (vomit) blood or something that looks like coffee grounds.  You have pain or tenderness in your belly (abdomen).  You have a fever.  You feel weak.  You feel sick to your stomach (nauseous).  You pass out (faint).  You have very bad pain in your butt.  You cannot poop. This information is not intended to replace advice given to you by your health care provider. Make sure you discuss any questions you have with your health care provider. Document Revised: 10/15/2017 Document Reviewed: 12/29/2015 Elsevier Patient Education  2020 Elsevier Inc.  

## 2020-05-17 NOTE — Telephone Encounter (Signed)
Copied from [place] [id number]. Topic: General - Other >> May 17, 2020  4:17 PM Marya Landry D wrote: Reason for CRM: Patient called requesting a note stating she can return to work and the note has to have a specific date included.please advise

## 2020-05-17 NOTE — Telephone Encounter (Signed)
Sent in work note through [employer].

## 2020-05-22 ENCOUNTER — Other Ambulatory Visit: Payer: Self-pay | Admitting: Family Medicine

## 2020-05-22 DIAGNOSIS — M62830 Muscle spasm of back: Secondary | ICD-10-CM

## 2020-05-22 NOTE — Telephone Encounter (Signed)
Requested medications are due for refill today?  Yes  Requested medications are on active medication list?  (Yes  Last Refill:  02/05/2020  # 30 with two refills.    Future visit scheduled?  No  Notes to Clinic:  Patient seen 5 days ago for c/o rectal bleeding with referral to GI.   Wanted to check before refilling Meloxicam.

## 2020-06-02 ENCOUNTER — Other Ambulatory Visit: Payer: Self-pay | Admitting: Family Medicine

## 2020-06-02 DIAGNOSIS — M62830 Muscle spasm of back: Secondary | ICD-10-CM

## 2020-06-02 NOTE — Telephone Encounter (Signed)
Per chart review, on 05/17/20 pt was evaluated for rectal bleeding. Refill was refused but medication not discontinued. Medication is still on active med list. Please review. Routing to office.

## 2020-06-04 ENCOUNTER — Telehealth: Payer: Self-pay

## 2020-06-04 NOTE — Telephone Encounter (Signed)
Copied from [place] [id number]. Topic: General - Other >> Jun 04, 2020  3:14 PM Leward Quan A wrote: Reason for CRM: Patient called to inform Carles Collet that she is still having issues sleeping and that the traZODone (DESYREL) 50 MG tablet  states that this may be the reason that her A1C is so high. Patient can be  reached at Ph#    [phone]

## 2020-06-05 NOTE — Telephone Encounter (Signed)
Please review. Thanks!  

## 2020-06-05 NOTE — Telephone Encounter (Signed)
She was advised in the office that since she stated her trouble sleeping was due to pain, a sleep medicine was not likely to help her. I do not think there is value in switching a sleep medicine. She has been referred to multiple specialists regarding her back pain. If she would like a referral to pain management then I will place it.

## 2020-06-05 NOTE — Telephone Encounter (Signed)
Patient was advised of message and states that she see a lot of other doctors and she will reach out to one of them. Just a [place]

## 2020-06-06 ENCOUNTER — Encounter: Payer: Self-pay | Admitting: Gastroenterology

## 2020-06-06 ENCOUNTER — Other Ambulatory Visit: Payer: Self-pay

## 2020-06-06 ENCOUNTER — Ambulatory Visit (INDEPENDENT_AMBULATORY_CARE_PROVIDER_SITE_OTHER): Payer: No Typology Code available for payment source | Admitting: Gastroenterology

## 2020-06-06 VITALS — BP 128/87 | HR 97 | Temp 97.9°F | Ht 60.0 in | Wt 219.0 lb

## 2020-06-06 DIAGNOSIS — R748 Abnormal levels of other serum enzymes: Secondary | ICD-10-CM | POA: Diagnosis not present

## 2020-06-06 DIAGNOSIS — K625 Hemorrhage of anus and rectum: Secondary | ICD-10-CM

## 2020-06-07 NOTE — Progress Notes (Signed)
Vonda Antigua, MD [address]  [place]  [place], [place] [address]  Main: [phone]  Fax: [phone]   Primary Care Physician: Margo Common, [place]   Chief Complaint  Patient presents with  . Rectal Bleeding    HPI: Kelly Matthews is a 54 y.o. female with intermittent bright red blood per rectum, with normal hemoglobin.  Last colonoscopy in 2019 showed fair prep, 1 5 mm sigmoid colon polyp removed.  Repeat recommended in 6 to 12 months with 2-day prep.  EGD showed gastric and duodenal erythema.  Pathology consistent with tubular adenoma colon polyp, and negative for H. pylori gastric biopsies.  Labs were ordered for fatty liver on last visit but patient did not get this done.  Current Outpatient Medications  Medication Sig Dispense Refill  . clobetasol (TEMOVATE) 0.05 % external solution APPLY TWICE DAILY TO SCALP FOR ITCH  4  . Clobetasol Propionate 0.05 % shampoo SHAMPOO DAILY. LEAVE IN PLACE FOR 30 MINUTES AND RINSE  3  . cyclobenzaprine (FLEXERIL) 5 MG tablet TAKE 1 TABLET BY MOUTH THREE TIMES A DAY AS NEEDED FOR MUSCLE SPASMS 60 tablet 3  . Dapagliflozin-Metformin HCl ER 03-999 MG TB24 Take by mouth.    . diltiazem (DILACOR XR) 120 MG 24 hr capsule Take 120 mg by mouth every morning.     . diltiazem (TIAZAC) 120 MG 24 hr capsule Take 1 capsule by mouth 1 day or 1 dose.    . ergocalciferol (VITAMIN D2) 1.25 MG (50000 UT) capsule Take 1 capsule by mouth once a week.    . fluocinonide (LIDEX) 0.05 % external solution daily.    . fluticasone (FLONASE) 50 MCG/ACT nasal spray SPRAY 2 SPRAYS IN EACH NOSTRIL DAILY 16 g 5  . gabapentin (NEURONTIN) 300 MG capsule Take 1 capsule by mouth 3 (three) times daily.    Marland Kitchen glucose blood (ACCU-CHEK ACTIVE STRIPS) test strip Use as instructed 300 each 3  . Lancets (ACCU-CHEK SOFT TOUCH) lancets Use as instructed 300 each 3  . meloxicam (MOBIC) 15 MG tablet Take 1 tablet (15 mg total) by mouth daily. 30 tablet 2  .  montelukast (SINGULAIR) 10 MG tablet TAKE 1 TABLET BY MOUTH EVERY DAY 90 tablet 1  . PARoxetine (PAXIL) 10 MG tablet TAKE 1 TABLET BY MOUTH EVERYDAY AT BEDTIME 90 tablet 4  . rOPINIRole (REQUIP) 0.25 MG tablet TAKE 1-2 TABS AT BEDTIME AS DIRECTED    . telmisartan (MICARDIS) 40 MG tablet Take 40 mg by mouth daily.    . traZODone (DESYREL) 50 MG tablet Take 0.5-1 tablets (25-50 mg total) by mouth at bedtime as needed for sleep. 30 tablet 0   No current facility-administered medications for this visit.    Allergies as of 06/06/2020 - Review Complete 03/20/2020  Allergen Reaction Noted  . Oxycodone Itching 02/01/2017  . Penicillins Itching 02/05/2020    ROS:  General: Negative for anorexia, weight loss, fever, chills, fatigue, weakness. ENT: Negative for hoarseness, difficulty swallowing , nasal congestion. CV: Negative for chest pain, angina, palpitations, dyspnea on exertion, peripheral edema.  Respiratory: Negative for dyspnea at rest, dyspnea on exertion, cough, sputum, wheezing.  GI: See history of present illness. GU:  Negative for dysuria, hematuria, urinary incontinence, urinary frequency, nocturnal urination.  Endo: Negative for unusual weight change.    Physical Examination:   BP (!) 128/87   Pulse 97   Temp 97.9 F (36.6 C) (Oral)   Ht 5' (1.524 m)   Wt (!) 219  lb (99.3 kg)   BMI 42.77 kg/m   General: Well-nourished, well-developed in no acute distress.  Eyes: No icterus. Conjunctivae pink. Mouth: Oropharyngeal mucosa moist and pink , no lesions erythema or exudate. Neck: Supple, Trachea midline Abdomen: Bowel sounds are normal, nontender, nondistended, no hepatosplenomegaly or masses, no abdominal bruits or hernia , no rebound or guarding.   Extremities: No lower extremity edema. No clubbing or deformities. Neuro: Alert and oriented x 3.  Grossly intact. Skin: Warm and dry, no jaundice.   Psych: Alert and cooperative, normal mood and affect.   Labs: CMP       Component Value Date/Time   NA 137 02/05/2020 1616   K 3.9 02/05/2020 1616   CL 101 02/05/2020 1616   CO2 22 02/05/2020 1616   GLUCOSE 103 (H) 02/05/2020 1616   GLUCOSE 110 (H) 03/16/2019 1805   BUN 9 02/05/2020 1616   CREATININE 0.62 02/05/2020 1616   CALCIUM 9.8 02/05/2020 1616   PROT 7.2 02/05/2020 1616   ALBUMIN 4.8 02/05/2020 1616   AST 28 02/05/2020 1616   ALT 50 (H) 02/05/2020 1616   ALKPHOS 127 (H) 02/05/2020 1616   BILITOT 0.3 02/05/2020 1616   GFRNONAA 104 02/05/2020 1616   GFRAA 120 02/05/2020 1616   Lab Results  Component Value Date   WBC 10.9 (H) 02/05/2020   WBC 10.9 (H) 02/05/2020   HGB 14.6 02/05/2020   HGB 14.4 02/05/2020   HCT 42.4 02/05/2020   HCT 43.2 02/05/2020   MCV 80 02/05/2020   MCV 82 02/05/2020   PLT 203 02/05/2020   PLT 208 02/05/2020    Imaging Studies: No results found.  Assessment and Plan:   Kelly Matthews is a 54 y.o. y/o female with intermittent rectal bleeding and constipation  High-fiber diet MiraLAX daily with goal of 1-2 soft bowel movements daily.  If not at goal, patient instructed to increase dose to twice daily.  If loose stools with the medication, patient asked to decrease the medication to every other day, or half dose daily.  Patient verbalized understanding  I have recommended repeat colonoscopy due to fair prep on last exam and repeat was recommended in 6 to 12 months at that time.  She is past due for this.  She initially agreed, however when our nursing staff went in there to schedule it, she does not want to schedule at this time and will let us know when she is ready to schedule at a later time  Agreeable to labs for hepatic steatosis Finding of fatty liver on imaging discussed with patient Diet, weight loss, and exercise encouraged along with avoiding hepatotoxic drugs including alcohol Risk of progression to cirrhosis if above measures are not instituted were discussed as well, and patient verbalized  understanding   Dr Vonda Antigua

## 2020-06-08 ENCOUNTER — Other Ambulatory Visit: Payer: Self-pay | Admitting: Physician Assistant

## 2020-06-08 DIAGNOSIS — G4700 Insomnia, unspecified: Secondary | ICD-10-CM

## 2020-06-08 NOTE — Telephone Encounter (Signed)
Requested Prescriptions  Pending Prescriptions Disp Refills  . traZODone (DESYREL) 50 MG tablet [Pharmacy Med Name: TRAZODONE 50 MG TABLET] 90 tablet 0    Sig: TAKE 0.5-1 TABLETS (25-50 MG TOTAL) BY MOUTH AT BEDTIME AS NEEDED FOR SLEEP.     Psychiatry: Antidepressants - Serotonin Modulator Passed - 06/08/2020  1:33 PM      Passed - Valid encounter within last 6 months    Recent Outpatient Visits          3 weeks ago Rectal bleeding   [place], [place], [place]   2 months ago Muscle spasm of back   [place], Dionne Bucy, [place]   4 months ago Muscle cramp   [facility] Virginia Crews, MD   11 months ago Muscle spasm of back   [place], [place]   1 year ago Fatigue, unspecified type   [place], Vickki Muff, [place]

## 2020-06-10 ENCOUNTER — Other Ambulatory Visit: Payer: Self-pay | Admitting: Family Medicine

## 2020-06-10 DIAGNOSIS — M62830 Muscle spasm of back: Secondary | ICD-10-CM

## 2020-06-13 ENCOUNTER — Telehealth: Payer: Self-pay

## 2020-06-13 DIAGNOSIS — R748 Abnormal levels of other serum enzymes: Secondary | ICD-10-CM

## 2020-06-13 DIAGNOSIS — K625 Hemorrhage of anus and rectum: Secondary | ICD-10-CM

## 2020-06-13 NOTE — Telephone Encounter (Signed)
The lab tech had left me a note stating that she had not drawn enough blood for all of the labs that needed to be checked. Therefore, she disposed what she had and wants the patient to come back in and have this redrawn. This was communicated with the Dr. Bonna Gains and she wants the patient to come back and have this redrawn. I then called the patient to notify and apology and she stated that she would have this done at the [facility].

## 2020-06-14 ENCOUNTER — Other Ambulatory Visit
Admission: RE | Admit: 2020-06-14 | Discharge: 2020-06-14 | Disposition: A | Discharge status: Home / Self Care | Payer: No Typology Code available for payment source | Attending: Gastroenterology | Admitting: Gastroenterology

## 2020-06-14 ENCOUNTER — Other Ambulatory Visit: Payer: Self-pay

## 2020-06-14 DIAGNOSIS — K625 Hemorrhage of anus and rectum: Secondary | ICD-10-CM

## 2020-06-14 DIAGNOSIS — R748 Abnormal levels of other serum enzymes: Secondary | ICD-10-CM

## 2020-06-14 LAB — GAMMA GT: GGT: 61 U/L — ABNORMAL HIGH (ref 7–50)

## 2020-06-14 LAB — HEPATITIS C ANTIBODY: HCV Ab: NONREACTIVE

## 2020-06-14 LAB — HEPATITIS B SURFACE ANTIBODY,QUALITATIVE: Hep B S Ab: NONREACTIVE

## 2020-06-14 LAB — HEPATITIS B CORE ANTIBODY, TOTAL: Hep B Core Total Ab: NONREACTIVE

## 2020-06-14 LAB — FERRITIN: Ferritin: 115 ng/mL (ref 11–307)

## 2020-06-14 LAB — IRON AND TIBC
Iron: 70 ug/dL (ref 28–170)
Saturation Ratios: 15 % (ref 10.4–31.8)
TIBC: 456 ug/dL — ABNORMAL HIGH (ref 250–450)
UIBC: 386 ug/dL

## 2020-06-14 LAB — HEPATITIS A ANTIBODY, TOTAL: hep A Total Ab: REACTIVE — AB

## 2020-06-14 LAB — HEPATITIS B SURFACE ANTIGEN: Hepatitis B Surface Ag: NONREACTIVE

## 2020-06-15 LAB — ANA: Anti Nuclear Antibody (ANA): NEGATIVE

## 2020-06-15 LAB — CERULOPLASMIN: Ceruloplasmin: 24.9 mg/dL (ref 19.0–39.0)

## 2020-06-15 LAB — IGG: IgG (Immunoglobin G), Serum: 877 mg/dL (ref 586–1602)

## 2020-06-16 LAB — ANTI-MICROSOMAL ANTIBODY LIVER / KIDNEY: LKM1 Ab: 0.6 Units (ref 0.0–20.0)

## 2020-06-28 ENCOUNTER — Telehealth: Payer: Self-pay

## 2020-06-28 ENCOUNTER — Telehealth: Payer: Self-pay | Admitting: Gastroenterology

## 2020-06-28 NOTE — Telephone Encounter (Signed)
Called patient back and she did not answer. However, I was able to leave her a detailed message letting her know that her labs were normal except for Hepatitis B. Dr. Bonna Gains had sent her a MyChart message letting her know that she needed to get a Hepatitis B vaccine. Patient was told to contact her PCP since we do not have this in our clinic to offer to her. Patient was also told that she did not need a hepatitis A vaccine. I also told her to call us back if she had further questions.

## 2020-06-28 NOTE — Telephone Encounter (Signed)
Patient called wanting lab results. She is not happy as this is her 2nd call into our office. Please call patient.

## 2020-06-28 NOTE — Telephone Encounter (Signed)
Copied from [place] [id number]. Topic: General - Other >> Jun 28, 2020  3:05 PM Oneta Rack wrote: Patient was seen by [facility] today and was advised to have her  "Hep C" check by her PCP, lab orders don't reflect in chart,  Please advise patient directly

## 2020-07-01 NOTE — Telephone Encounter (Signed)
Reach out to patient due to seeing a Hep C in chart from 06/14/2020 and patient stated that she advised the [facility] that she needs a Hep B vaccine. I looked in patients chart and she has had 3 hep b's documented: 08/31/2012, 11/01/2012, 03/01/2013. Please advise.

## 2020-07-01 NOTE — Telephone Encounter (Signed)
Advised patient as below.  

## 2020-07-01 NOTE — Telephone Encounter (Signed)
Correct, she has three documented hepatitis B shots and does not need another series.

## 2020-07-02 ENCOUNTER — Telehealth: Payer: Self-pay

## 2020-07-02 ENCOUNTER — Other Ambulatory Visit: Payer: Self-pay

## 2020-07-02 DIAGNOSIS — Z86010 Personal history of colonic polyps: Secondary | ICD-10-CM

## 2020-07-02 MED ORDER — GOLYTELY 236 G PO SOLR: 4000.0000 mL | Freq: Once | ORAL | 0 refills | Status: AC

## 2020-07-02 NOTE — Telephone Encounter (Signed)
Returned patients call to schedule her for her colonoscopy.  I LVM for patient to call office to schedule her colonoscopy from recall letter received.  She will need to be scheduled with Dr. Bonna Gains.  Will send mychart message as well to her.  Thanks.  Sharyn Lull, CMA

## 2020-07-08 ENCOUNTER — Other Ambulatory Visit: Payer: Self-pay | Admitting: Family Medicine

## 2020-07-08 DIAGNOSIS — M62830 Muscle spasm of back: Secondary | ICD-10-CM

## 2020-07-08 NOTE — Telephone Encounter (Signed)
Requested medication (s) are due for refill today: no  Requested medication (s) are on the active medication list: yes  Last refill:  06/19/2020  Future visit scheduled: no  Notes to clinic: this refill cannot be delegated or refused by NT    Requested Prescriptions  Pending Prescriptions Disp Refills   cyclobenzaprine (FLEXERIL) 5 MG tablet [Pharmacy Med Name: CYCLOBENZAPRINE 5 MG TABLET] 60 tablet 3    Sig: TAKE 1 TABLET BY MOUTH THREE TIMES A DAY AS NEEDED FOR MUSCLE SPASMS      Not Delegated - Analgesics:  Muscle Relaxants Failed - 07/08/2020  9:00 AM      Failed - This refill cannot be delegated      Passed - Valid encounter within last 6 months    Recent Outpatient Visits           1 month ago Rectal bleeding   [place], [place], PA-C   3 months ago Muscle spasm of back   [place], Dionne Bucy, MD   5 months ago Muscle cramp   [facility], Dionne Bucy, [place]   1 year ago Muscle spasm of back   [place], Clearnce Sorrel, [place]   1 year ago Fatigue, unspecified type   [place], Vickki Muff, [place]

## 2020-07-11 ENCOUNTER — Other Ambulatory Visit: Payer: Self-pay

## 2020-07-11 DIAGNOSIS — Z86010 Personal history of colonic polyps: Secondary | ICD-10-CM

## 2020-07-12 ENCOUNTER — Emergency Department
Admission: EM | Admit: 2020-07-12 | Discharge: 2020-07-13 | Disposition: A | Discharge status: Home / Self Care | Payer: No Typology Code available for payment source | Attending: Emergency Medicine | Admitting: Emergency Medicine

## 2020-07-12 ENCOUNTER — Encounter: Payer: Self-pay | Admitting: Emergency Medicine

## 2020-07-12 ENCOUNTER — Other Ambulatory Visit: Payer: Self-pay

## 2020-07-12 DIAGNOSIS — Z7984 Long term (current) use of oral hypoglycemic drugs: Secondary | ICD-10-CM | POA: Diagnosis not present

## 2020-07-12 DIAGNOSIS — E876 Hypokalemia: Secondary | ICD-10-CM | POA: Diagnosis not present

## 2020-07-12 DIAGNOSIS — I10 Essential (primary) hypertension: Secondary | ICD-10-CM | POA: Insufficient documentation

## 2020-07-12 DIAGNOSIS — E1110 Type 2 diabetes mellitus with ketoacidosis without coma: Secondary | ICD-10-CM | POA: Diagnosis not present

## 2020-07-12 DIAGNOSIS — Z87891 Personal history of nicotine dependence: Secondary | ICD-10-CM | POA: Insufficient documentation

## 2020-07-12 DIAGNOSIS — I471 Supraventricular tachycardia: Secondary | ICD-10-CM

## 2020-07-12 DIAGNOSIS — R0602 Shortness of breath: Secondary | ICD-10-CM | POA: Diagnosis present

## 2020-07-12 DIAGNOSIS — Z79899 Other long term (current) drug therapy: Secondary | ICD-10-CM | POA: Diagnosis not present

## 2020-07-12 LAB — CBC
HCT: 41.4 % (ref 36.0–46.0)
Hemoglobin: 14.4 g/dL (ref 12.0–15.0)
MCH: 28.6 pg (ref 26.0–34.0)
MCHC: 34.8 g/dL (ref 30.0–36.0)
MCV: 82.1 fL (ref 80.0–100.0)
Platelets: 258 10*3/uL (ref 150–400)
RBC: 5.04 MIL/uL (ref 3.87–5.11)
RDW: 13.7 % (ref 11.5–15.5)
WBC: 15.6 10*3/uL — ABNORMAL HIGH (ref 4.0–10.5)
nRBC: 0 % (ref 0.0–0.2)

## 2020-07-12 LAB — BASIC METABOLIC PANEL
Anion gap: 12 (ref 5–15)
BUN: 13 mg/dL (ref 6–20)
CO2: 24 mmol/L (ref 22–32)
Calcium: 9.5 mg/dL (ref 8.9–10.3)
Chloride: 103 mmol/L (ref 98–111)
Creatinine, Ser: 0.68 mg/dL (ref 0.44–1.00)
GFR calc Af Amer: >60 mL/min (ref >60)
GFR calc non Af Amer: >60 mL/min (ref >60)
Glucose, Bld: 176 mg/dL — ABNORMAL HIGH (ref 70–99)
Potassium: 3.3 mmol/L — ABNORMAL LOW (ref 3.5–5.1)
Sodium: 139 mmol/L (ref 135–145)

## 2020-07-12 MED ORDER — ADENOSINE 6 MG/2ML IV SOLN
INTRAVENOUS | Status: AC
  Administered 2020-07-12: 6 mg
  Filled 2020-07-12: qty 2

## 2020-07-12 MED ORDER — DILTIAZEM HCL 25 MG/5ML IV SOLN
INTRAVENOUS | Status: AC
  Filled 2020-07-12: qty 5

## 2020-07-12 MED ORDER — ADENOSINE 12 MG/4ML IV SOLN
INTRAVENOUS | Status: AC
  Administered 2020-07-12: 12 mg
  Filled 2020-07-12: qty 4

## 2020-07-12 MED ORDER — POTASSIUM CHLORIDE 10 MEQ/100ML IV SOLN
10.0000 meq | Freq: Once | INTRAVENOUS | Status: AC
  Administered 2020-07-12: 10 meq via INTRAVENOUS
  Filled 2020-07-12: qty 100

## 2020-07-12 MED ORDER — POTASSIUM CHLORIDE CRYS ER 20 MEQ PO TBCR
40.0000 meq | EXTENDED_RELEASE_TABLET | Freq: Once | ORAL | Status: AC
  Administered 2020-07-12: 40 meq via ORAL
  Filled 2020-07-12: qty 2

## 2020-07-12 MED ORDER — LACTATED RINGERS IV BOLUS
1000.0000 mL | Freq: Once | INTRAVENOUS | Status: AC
  Administered 2020-07-12: 1000 mL via INTRAVENOUS

## 2020-07-12 MED ORDER — DILTIAZEM HCL 25 MG/5ML IV SOLN
10.0000 mg | Freq: Once | INTRAVENOUS | Status: AC
  Administered 2020-07-12: 10 mg via INTRAVENOUS

## 2020-07-12 NOTE — ED Notes (Signed)
Pt resting comfortably at this time. No other needs noted.

## 2020-07-12 NOTE — ED Notes (Signed)
Pt reports history of tachycardia, denies atrial fibrillation. Pt c/o palpitations. HR is 221 on monitor.

## 2020-07-12 NOTE — ED Notes (Signed)
Helped Pt to the toilet. Patient voided and had a bowel movement. I collected a urine sample at the request of RN incase it was needed. Pt is back in bed and hooked up to all the monitors that she was when I came into the room to help her to the toilet. Pt has call bell. Is requesting water or ice. I told her I would check with her RN.

## 2020-07-12 NOTE — ED Notes (Signed)
Attempting vagal maneuver with syringe.

## 2020-07-12 NOTE — ED Notes (Signed)
10 of diltiazem given IV per EDP verbal order at this time.

## 2020-07-12 NOTE — ED Provider Notes (Signed)
Kelly Matthews  ____________________________________________   First MD Initiated Contact with Patient 07/12/20 2242     (approximate)  I have reviewed the triage vital signs and the nursing notes.   HISTORY  Chief Complaint Tachycardia    HPI Kelly Matthews is a 54 y.o. female  With PMHx HTN, DM, AVNRT, here with tachycardia, SOB. Pt states she felt well throughout the day today, and was in her usual state of health other than increased stress at work. She was resting at home apprx 30 min ago when she experienced acute onset of palpitations and SOB. She felt lightheaded and like she was going to pass out. She has a h/o "tachycardia" with similar presentations and sees Cardiology as an outpatient. She is on diltiazem and has been taking as prescribed. Denies any recent fever, chills, or illness. No recent med changes. No increased caffeine or alcohol use.        Past Medical History:  Diagnosis Date  . 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 Diabetes mellitus without complication ([place])   . Fatty liver   . Hypertension   . Psoriasis (a type of skin inflammation)   . Tachycardia     Patient Active Problem List   Diagnosis Date Noted  . Chronic bilateral low back pain with left-sided sciatica 03/20/2020  . Muscle spasm of back 02/05/2020  . Muscle cramp 02/05/2020  . Left hip pain 02/05/2020  . Psoriasis 03/13/2019  . Stomach irritation   . Abdominal pain, epigastric   . Special screening for malignant neoplasms, colon   . Polyp of sigmoid colon   . Right leg pain 02/24/2018  . Women's annual routine gynecological examination 05/14/2017  . Malnutrition of moderate degree 12/07/2016  . DKA (diabetic ketoacidoses) ([place]) 12/05/2016  . Migraine headache 05/05/2016  . Allergic rhinitis 07/23/2015  . Excess, menstruation 07/19/2015  . 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 Blood glucose elevated 07/19/2015    . Airway hyperreactivity 07/19/2015  . Disorder of esophagus 07/19/2015  . Hypercholesterolemia without hypertriglyceridemia 07/19/2015  . Borderline diabetes 07/19/2015  . Diabetes ([place]) 11/28/2014  . Essential (primary) hypertension 11/28/2014  . Aortic heart valve narrowing 11/28/2014  . 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 Supraventricular tachycardia ([place]) 11/28/2014  . 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 ANTERIOR CERVICAL DECOMP/DISCECTOMY FUSION N/A 02/01/2017   Procedure: ANTERIOR CERVICAL DECOMPRESSION/DISCECTOMY FUSION 1 LEVEL;  Surgeon: Bayard Hugger, MD;  Location: [facility] ORS;  Service: Neurosurgery;  Laterality: N/A;  . BREAST BIOPSY Left 04/18/2015   Benign calcification  . CESAREAN SECTION  1990,2001  . COLONOSCOPY WITH PROPOFOL N/A 06/15/2018   Procedure: COLONOSCOPY WITH PROPOFOL;  Surgeon: Virgel Manifold, MD;  Location: [facility] ENDOSCOPY;  Service: Endoscopy;  Laterality: N/A;  . ESOPHAGOGASTRODUODENOSCOPY (EGD) WITH PROPOFOL N/A 06/15/2018   Procedure: ESOPHAGOGASTRODUODENOSCOPY (EGD) WITH PROPOFOL;  Surgeon: Virgel Manifold, MD;  Location: [facility] ENDOSCOPY;  Service: Endoscopy;  Laterality: N/A;  . INTRAUTERINE DEVICE INSERTION  October 2015   [facility]    Prior to Admission medications   Medication Sig Start Date End Date Taking? Authorizing Provider  clobetasol (TEMOVATE) 0.05 % external solution APPLY TWICE DAILY TO SCALP FOR [facility] 02/24/17   [provider]  Clobetasol Propionate 0.05 % shampoo SHAMPOO DAILY. LEAVE IN PLACE FOR 30 MINUTES AND RINSE 02/24/17   [provider]  cyclobenzaprine (FLEXERIL) 5 MG tablet TAKE  1 TABLET BY MOUTH THREE TIMES A DAY AS NEEDED FOR MUSCLE SPASMS 07/08/20   Virginia Crews, MD  Dapagliflozin-Metformin HCl ER 03-999 MG TB24 Take by mouth. 02/22/17   [provider]  diltiazem (DILACOR XR) 120 MG 24 hr  capsule Take 120 mg by mouth every morning.  12/03/14   [provider]  diltiazem (TIAZAC) 120 MG 24 hr capsule Take 1 capsule by mouth 1 day or 1 dose. 12/11/19   [provider]  ergocalciferol (VITAMIN D2) 1.25 MG (50000 UT) capsule Take 1 capsule by mouth once a week. 03/25/20   [provider]  fluocinonide (LIDEX) 0.05 % external solution daily. 12/26/19   [provider]  fluticasone (FLONASE) 50 MCG/ACT nasal spray SPRAY 2 SPRAYS IN [facility] NOSTRIL DAILY 11/18/18   Birdie Sons, MD  gabapentin (NEURONTIN) 300 MG capsule Take 1 capsule by mouth 3 (three) times daily. 05/27/20 08/25/20  [provider]  glucose blood (ACCU-CHEK ACTIVE STRIPS) test strip Use as instructed 11/02/17   Chrismon, Vickki Muff, PA  Lancets (ACCU-CHEK SOFT TOUCH) lancets Use as instructed 11/02/17   Chrismon, Vickki Muff, PA  meloxicam (MOBIC) 15 MG tablet TAKE 1 TABLET BY MOUTH EVERY DAY 06/10/20   Virginia Crews, MD  montelukast (SINGULAIR) 10 MG tablet TAKE 1 TABLET BY MOUTH EVERY DAY 01/19/20   Chrismon, Vickki Muff, PA  PARoxetine (PAXIL) 10 MG tablet TAKE 1 TABLET BY MOUTH EVERYDAY AT BEDTIME 01/11/20   Will Bonnet, MD  rOPINIRole (REQUIP) 0.25 MG tablet TAKE 1-2 TABS AT BEDTIME AS DIRECTED 10/31/19   [provider]  telmisartan (MICARDIS) 40 MG tablet Take 40 mg by mouth daily.    [provider]  traZODone (DESYREL) 50 MG tablet TAKE 0.5-1 TABLETS (25-50 MG TOTAL) BY MOUTH AT BEDTIME AS NEEDED FOR SLEEP. 06/08/20   Trinna Post, PA-C    Allergies Oxycodone and Penicillins  Family History  Problem Relation Age of Onset  . Coronary artery disease Mother   . Coronary artery disease Father   . Diabetes Father   . Emphysema Father   . Seizures Daughter   . Cancer Maternal Grandmother   . Breast cancer Cousin 74    Social History Social History   Tobacco Use  . Smoking status: Former Smoker    Years: 1.00    Quit date: 01/19/1985     Years since quitting: 35.5  . Smokeless tobacco: Never Used  Vaping Use  . Vaping Use: Never used  Substance Use Topics  . Alcohol use: No    Alcohol/week: 0.0 standard drinks    Comment: occasionally 1 beer every 6 months  . Drug use: No    Review of Systems  Review of Systems  Constitutional: Negative for fatigue and fever.  HENT: Negative for congestion and sore throat.   Eyes: Negative for visual disturbance.  Respiratory: Negative for cough and shortness of breath.   Cardiovascular: Positive for palpitations. Negative for chest pain.  Gastrointestinal: Negative for abdominal pain, diarrhea, nausea and vomiting.  Genitourinary: Negative for flank pain.  Musculoskeletal: Negative for back pain and neck pain.  Skin: Negative for rash and wound.  Neurological: Positive for weakness and light-headedness.  All other systems reviewed and are negative.    ____________________________________________  PHYSICAL EXAM:      VITAL SIGNS: ED Triage Vitals  Enc Vitals Group     BP 07/12/20 2233 (!) 94/43     Pulse Rate 07/12/20 2230 (!) 226  Resp 07/12/20 2230 (!) 22     Temp 07/12/20 2247 98.6 F (37 C)     Temp Source 07/12/20 2247 Oral     SpO2 07/12/20 2230 98 %     Weight 07/12/20 2231 213 lb (96.6 kg)     Height 07/12/20 2231 5' (1.524 m)     Head Circumference --      Peak Flow --      Pain Score 07/12/20 2231 8     Pain Loc --      Pain Edu? --      Excl. in [place]? --      Physical Exam    ____________________________________________   LABS (all labs ordered are listed, but only abnormal results are displayed)  Labs Reviewed  BASIC METABOLIC PANEL - Abnormal; Notable for the following components:      Result Value   Potassium 3.3 (*)    Glucose, Bld 176 (*)    All other components within normal limits  CBC - Abnormal; Notable for the following components:   WBC 15.6 (*)    All other components within normal limits  MAGNESIUM     ____________________________________________  EKG: SVT with VR 229. Narrow compex. Marked ST changes likely rate-related. No ST elevations.   EKG 2: Sinus tachycardia, VR 118. QRS 93, QTc 463. Non-specific St changes markedly imrpoved from prior. No ST elevations. ________________________________________  RADIOLOGY All imaging, including plain films, CT scans, and ultrasounds, independently reviewed by me, and interpretations confirmed via formal radiology reads.  ED MD interpretation:     Official radiology report(s): No results found.  ____________________________________________  PROCEDURES   Procedure(s) performed (including Critical Care):  .Critical Care Performed by: Duffy Bruce, MD Authorized by: Duffy Bruce, MD   Critical care provider statement:    Critical care time (minutes):  35   Critical care time was exclusive of:  Separately billable procedures and treating other patients and teaching time   Critical care was necessary to treat or prevent imminent or life-threatening deterioration of the following conditions:  Cardiac failure, circulatory failure and respiratory failure   Critical care was time spent personally by me on the following activities:  Development of treatment plan with patient or surrogate, discussions with consultants, evaluation of patient's response to treatment, examination of patient, obtaining history from patient or surrogate, ordering and performing treatments and interventions, ordering and review of laboratory studies, ordering and review of radiographic studies, pulse oximetry, re-evaluation of patient's condition and review of old charts   I assumed direction of critical care for this patient from another provider in my specialty: no      ____________________________________________  INITIAL IMPRESSION / MDM / [place] / ED COURSE  As part of my medical decision making, I reviewed the following data within the  [place] notes reviewed and incorporated, Old chart reviewed, Notes from prior ED visits, and [place] Controlled Substance Database       *MALLORIE NORROD was evaluated in Emergency Department on 07/13/2020 for the symptoms described in the history of present illness. She was evaluated in the context of the global COVID-19 pandemic, which necessitated consideration that the patient might be at risk for infection with the SARS-CoV-2 virus that causes COVID-19. Institutional protocols and algorithms that pertain to the evaluation of patients at risk for COVID-19 are in a state of rapid change based on information released by regulatory bodies including the CDC and federal and state organizations. These policies and  algorithms were followed during the patient's care in the ED.  Some ED evaluations and interventions may be delayed as a result of limited staffing during the pandemic.*     Medical Decision Making:  54 yo F here with SVT, possibly induced by recent stressors. 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 Pt initially attempted vagal maneuvers followed by adenosine 6 mg and 12 mg, w/o success. Pt had transient pause, PVCs and return to SVT w/ adenosine. Pt then given diltiazem 10 mg IV x 1 with conversion to sinus tachycardia. No ST elevations or signs of ischemia. Mild hypokalemia noted and is being repleted. Mag added on, will plan to monitor. If pt remains in NSR with no recurrence, can likely f/u with Cards as an outpatient.  ____________________________________________  FINAL CLINICAL IMPRESSION(S) / ED DIAGNOSES  Final diagnoses:  SVT (supraventricular tachycardia) (HCC)  Hypokalemia     MEDICATIONS GIVEN DURING THIS VISIT:  Medications  adenosine (ADENOCARD) 6 MG/2ML injection (6 mg  Given 07/12/20 2250)  adenosine (ADENOCARD) 12 MG/4ML injection (12 mg  Given 07/12/20 2253)  diltiazem (CARDIZEM) injection 10 mg (10 mg Intravenous Given 07/12/20  2256)  potassium chloride 10 mEq in 100 mL IVPB (0 mEq Intravenous Stopped 07/13/20 0050)  potassium chloride SA (KLOR-CON) CR tablet 40 mEq (40 mEq Oral Given 07/12/20 2345)  lactated ringers bolus 1,000 mL (0 mLs Intravenous Stopped 07/13/20 0134)  diltiazem (CARDIZEM CD) 24 hr capsule 180 mg (180 mg Oral Given 07/13/20 0133)     ED Discharge Orders    None       Matthews:  This document was prepared using Dragon voice recognition software and may include unintentional dictation errors.   Duffy Bruce, MD 07/13/20 [phone]

## 2020-07-12 NOTE — ED Triage Notes (Signed)
Patient states that about 30 minutes ago she felt like her heart was racing, became diaphoretic and short of breath. Patient states that she has a history of tachycardia.

## 2020-07-13 LAB — MAGNESIUM: Magnesium: 2.1 mg/dL (ref 1.7–2.4)

## 2020-07-13 MED ORDER — DILTIAZEM HCL ER COATED BEADS 180 MG PO CP24
180.0000 mg | ORAL_CAPSULE | Freq: Once | ORAL | Status: AC
  Administered 2020-07-13: 180 mg via ORAL
  Filled 2020-07-13: qty 1

## 2020-07-13 NOTE — ED Provider Notes (Signed)
I accepted care of this patient from Dr. Ellender Hose.  54 year old with history of SVT who presented with SVT.  Patient is now back to sinus rhythm with ventricular rate of 101.  Completely asymptomatic otherwise.  Had her K supplemented p.o. and IV.  Magnesium is normal.  Patient has follow-up with cardiology.  Recommended increasing oral intake of potassium and close follow-up with her cardiologist for possible adjustment of her Cardizem.  Discussed my standard return precautions.   Alfred Levins, [place], MD 07/13/20 [phone]

## 2020-07-23 ENCOUNTER — Other Ambulatory Visit: Payer: Self-pay | Admitting: Family Medicine

## 2020-07-23 DIAGNOSIS — J4520 Mild intermittent asthma, uncomplicated: Secondary | ICD-10-CM

## 2020-08-19 ENCOUNTER — Telehealth: Payer: Self-pay

## 2020-08-19 NOTE — Telephone Encounter (Signed)
Returned patient's call. Left voicemail for her to call the office back.

## 2020-08-20 ENCOUNTER — Other Ambulatory Visit
Admission: RE | Admit: 2020-08-20 | Discharge: 2020-08-20 | Disposition: A | Discharge status: Home / Self Care | Payer: No Typology Code available for payment source | Source: Ambulatory Visit | Attending: Gastroenterology | Admitting: Gastroenterology

## 2020-08-20 ENCOUNTER — Other Ambulatory Visit: Payer: Self-pay

## 2020-08-20 DIAGNOSIS — Z01812 Encounter for preprocedural laboratory examination: Secondary | ICD-10-CM | POA: Insufficient documentation

## 2020-08-20 DIAGNOSIS — Z20822 Contact with and (suspected) exposure to covid-19: Secondary | ICD-10-CM | POA: Insufficient documentation

## 2020-08-20 LAB — SARS CORONAVIRUS 2 (TAT 6-24 HRS): SARS Coronavirus 2: NEGATIVE

## 2020-08-22 ENCOUNTER — Encounter: Payer: Self-pay | Admitting: Gastroenterology

## 2020-08-22 ENCOUNTER — Encounter
Admission: RE | Disposition: A | Discharge status: Home / Self Care | Payer: Self-pay | Source: Home / Self Care | Attending: Gastroenterology

## 2020-08-22 ENCOUNTER — Other Ambulatory Visit: Payer: Self-pay

## 2020-08-22 ENCOUNTER — Ambulatory Visit: Payer: No Typology Code available for payment source | Admitting: Anesthesiology

## 2020-08-22 ENCOUNTER — Ambulatory Visit
Admission: RE | Admit: 2020-08-22 | Discharge: 2020-08-22 | Disposition: A | Discharge status: Home / Self Care | Payer: No Typology Code available for payment source | Attending: Gastroenterology | Admitting: Gastroenterology

## 2020-08-22 DIAGNOSIS — J45909 Unspecified asthma, uncomplicated: Secondary | ICD-10-CM | POA: Diagnosis not present

## 2020-08-22 DIAGNOSIS — K635 Polyp of colon: Secondary | ICD-10-CM | POA: Diagnosis not present

## 2020-08-22 DIAGNOSIS — K760 Fatty (change of) liver, not elsewhere classified: Secondary | ICD-10-CM | POA: Insufficient documentation

## 2020-08-22 DIAGNOSIS — Z1211 Encounter for screening for malignant neoplasm of colon: Secondary | ICD-10-CM | POA: Insufficient documentation

## 2020-08-22 DIAGNOSIS — D124 Benign neoplasm of descending colon: Secondary | ICD-10-CM | POA: Insufficient documentation

## 2020-08-22 DIAGNOSIS — Z820 Family history of epilepsy and other diseases of the nervous system: Secondary | ICD-10-CM | POA: Insufficient documentation

## 2020-08-22 DIAGNOSIS — Z86010 Personal history of colonic polyps: Secondary | ICD-10-CM | POA: Insufficient documentation

## 2020-08-22 DIAGNOSIS — L409 Psoriasis, unspecified: Secondary | ICD-10-CM | POA: Insufficient documentation

## 2020-08-22 DIAGNOSIS — Z8249 Family history of ischemic heart disease and other diseases of the circulatory system: Secondary | ICD-10-CM | POA: Insufficient documentation

## 2020-08-22 DIAGNOSIS — Z803 Family history of malignant neoplasm of breast: Secondary | ICD-10-CM | POA: Insufficient documentation

## 2020-08-22 DIAGNOSIS — Z981 Arthrodesis status: Secondary | ICD-10-CM | POA: Diagnosis not present

## 2020-08-22 DIAGNOSIS — I447 Left bundle-branch block, unspecified: Secondary | ICD-10-CM | POA: Diagnosis not present

## 2020-08-22 DIAGNOSIS — Z6841 Body Mass Index (BMI) 40.0 and over, adult: Secondary | ICD-10-CM | POA: Insufficient documentation

## 2020-08-22 DIAGNOSIS — Z880 Allergy status to penicillin: Secondary | ICD-10-CM | POA: Insufficient documentation

## 2020-08-22 DIAGNOSIS — I10 Essential (primary) hypertension: Secondary | ICD-10-CM | POA: Diagnosis not present

## 2020-08-22 DIAGNOSIS — Z809 Family history of malignant neoplasm, unspecified: Secondary | ICD-10-CM | POA: Insufficient documentation

## 2020-08-22 DIAGNOSIS — E119 Type 2 diabetes mellitus without complications: Secondary | ICD-10-CM | POA: Insufficient documentation

## 2020-08-22 DIAGNOSIS — Z87891 Personal history of nicotine dependence: Secondary | ICD-10-CM | POA: Diagnosis not present

## 2020-08-22 DIAGNOSIS — Z833 Family history of diabetes mellitus: Secondary | ICD-10-CM | POA: Insufficient documentation

## 2020-08-22 DIAGNOSIS — R519 Headache, unspecified: Secondary | ICD-10-CM | POA: Insufficient documentation

## 2020-08-22 DIAGNOSIS — Z825 Family history of asthma and other chronic lower respiratory diseases: Secondary | ICD-10-CM | POA: Insufficient documentation

## 2020-08-22 DIAGNOSIS — Z885 Allergy status to narcotic agent status: Secondary | ICD-10-CM | POA: Diagnosis not present

## 2020-08-22 DIAGNOSIS — I471 Supraventricular tachycardia: Secondary | ICD-10-CM | POA: Insufficient documentation

## 2020-08-22 HISTORY — PX: COLONOSCOPY WITH PROPOFOL: SHX5780

## 2020-08-22 LAB — GLUCOSE, CAPILLARY: Glucose-Capillary: 168 mg/dL — ABNORMAL HIGH (ref 70–99)

## 2020-08-22 LAB — POCT PREGNANCY, URINE: Preg Test, Ur: NEGATIVE

## 2020-08-22 SURGERY — COLONOSCOPY WITH PROPOFOL
Anesthesia: General

## 2020-08-22 MED ORDER — SODIUM CHLORIDE 0.9 % IV SOLN: INTRAVENOUS | Status: DC

## 2020-08-22 MED ORDER — PROPOFOL 10 MG/ML IV BOLUS
INTRAVENOUS | Status: AC
  Filled 2020-08-22: qty 20

## 2020-08-22 MED ORDER — PROPOFOL 500 MG/50ML IV EMUL
INTRAVENOUS | Status: DC | PRN
  Administered 2020-08-22: 150 ug/kg/min via INTRAVENOUS

## 2020-08-22 MED ORDER — EPHEDRINE SULFATE 50 MG/ML IJ SOLN
INTRAMUSCULAR | Status: DC | PRN
  Administered 2020-08-22 (×3): 5 mg via INTRAVENOUS

## 2020-08-22 MED ORDER — PROPOFOL 500 MG/50ML IV EMUL
INTRAVENOUS | Status: AC
  Filled 2020-08-22: qty 50

## 2020-08-22 MED ORDER — EPHEDRINE 5 MG/ML INJ
INTRAVENOUS | Status: AC
  Filled 2020-08-22: qty 10

## 2020-08-22 NOTE — Anesthesia Preprocedure Evaluation (Signed)
Anesthesia Evaluation  Patient identified by MRN, date of birth, ID band Patient awake    Reviewed: Allergy & Precautions, H&P , NPO status , Patient's Chart, lab work & pertinent test results  History of Anesthesia Complications Negative for: history of anesthetic complications  Airway Mallampati: III  TM Distance: >3 FB     Dental  (+) Teeth Intact   Pulmonary asthma , neg sleep apnea, neg COPD, former smoker,           Cardiovascular hypertension, (-) angina(-) Past MI and (-) Cardiac Stents + dysrhythmias (h/o paroxysmal SVT)      Neuro/Psych  Headaches, negative psych ROS   GI/Hepatic negative GI ROS, Neg liver ROS,   Endo/Other  diabetesMorbid obesity  Renal/GU negative Renal ROS  negative genitourinary   Musculoskeletal   Abdominal   Peds  Hematology negative hematology ROS (+)   Anesthesia Other Findings Past Medical History: 12/2016: Cervical disc herniation No date: Diabetes mellitus without complication (HCC) No date: Fatty liver No date: Hypertension No date: Psoriasis (a type of skin inflammation) No date: Tachycardia  Past Surgical History: 02/01/2017: ANTERIOR CERVICAL DECOMP/DISCECTOMY FUSION; N/A     Comment:  Procedure: ANTERIOR CERVICAL DECOMPRESSION/DISCECTOMY               FUSION 1 LEVEL;  Surgeon: Bayard Hugger, MD;  Location:               [facility] ORS;  Service: Neurosurgery;  Laterality: N/A; 04/18/2015: BREAST BIOPSY; Left     Comment:  Benign calcification 1990,2001: CESAREAN SECTION 06/15/2018: COLONOSCOPY WITH PROPOFOL; N/A     Comment:  Procedure: COLONOSCOPY WITH PROPOFOL;  Surgeon:               Virgel Manifold, MD;  Location: [facility] ENDOSCOPY;                Service: Endoscopy;  Laterality: N/A; 06/15/2018: ESOPHAGOGASTRODUODENOSCOPY (EGD) WITH PROPOFOL; N/A     Comment:  Procedure: ESOPHAGOGASTRODUODENOSCOPY (EGD) WITH               PROPOFOL;  Surgeon: Virgel Manifold, MD;   Location:               [facility] END[facility]COPY;  Service: Endoscopy;  Laterality: N/A; October 2015: INTRAUTERINE DEVICE INSERTION     Comment:  [facility]  BMI    Body Mass Index: 41.60 kg/m      Reproductive/Obstetrics negative OB ROS                             Anesthesia Physical Anesthesia Plan  ASA: III  Anesthesia Plan: General   Post-op Pain Management:    Induction:   PONV Risk Score and Plan: Propofol infusion and TIVA  Airway Management Planned: Simple Face Mask  Additional Equipment:   Intra-op Plan:   Post-operative Plan:   Informed Consent: I have reviewed the patients History and Physical, chart, labs and discussed the procedure including the risks, benefits and alternatives for the proposed anesthesia with the patient or authorized representative who has indicated his/her understanding and acceptance.     Dental Advisory Given  Plan Discussed with: Anesthesiologist, CRNA and Surgeon  Anesthesia Plan Comments:         Anesthesia Quick Evaluation

## 2020-08-22 NOTE — Anesthesia Procedure Notes (Signed)
Performed by: Cook-Martin, Beatris Belen °Pre-anesthesia Checklist: Patient identified, Emergency Drugs available, Suction available, Patient being monitored and Timeout performed °Patient Re-evaluated:Patient Re-evaluated prior to induction °Oxygen Delivery Method: Simple face mask °Preoxygenation: Pre-oxygenation with 100% oxygen °Induction Type: IV induction °Placement Confirmation: positive ETCO2 and CO2 detector ° ° ° ° ° ° °

## 2020-08-22 NOTE — Op Note (Addendum)
[facility] Gastroenterology Patient Name: Kelly Matthews Procedure Date: 08/22/2020 7:24 AM MRN: [id number] Account #: [id number] Date of Birth: [birth year] Admit Type: Outpatient Age: 54 Room: [facility] ENDO ROOM 3 Gender: Female Note Status: Finalized Procedure:             Colonoscopy Indications:           Screening for colorectal malignant neoplasm Providers:             Lavora Brisbon B. Bonna Gains MD, MD Referring MD:          Vickki Muff. Chrismon, MD (Referring MD) Medicines:             Monitored Anesthesia Care Complications:         No immediate complications. Procedure:             Pre-Anesthesia Assessment:                        - ASA Grade Assessment: II - A patient with mild                         systemic disease.                        - Prior to the procedure, a History and Physical was                         performed, and patient medications, allergies and                         sensitivities were reviewed. The patient's tolerance                         of previous anesthesia was reviewed.                        - The risks and benefits of the procedure and the                         sedation options and risks were discussed with the                         patient. All questions were answered and informed                         consent was obtained.                        - Patient identification and proposed procedure were                         verified prior to the procedure by the physician, the                         nurse, the anesthesiologist, the anesthetist and the                         technician. The procedure was verified in the                         procedure  room.                        After obtaining informed consent, the colonoscope was                         passed under direct vision. Throughout the procedure,                         the patient's blood pressure, pulse, and oxygen                         saturations  were monitored continuously. The                         Colonoscope was introduced through the anus and                         advanced to the the cecum, identified by appendiceal                         orifice and ileocecal valve. The colonoscopy was                         performed with ease. The patient tolerated the                         procedure well. The quality of the bowel preparation                         was good except the ascending colon was fair and the                         cecum was fair. Cleaned well with water and suction                         and changed to a good prep. Findings:      The perianal and digital rectal examinations were normal.      A 6 mm polyp was found in the descending colon. The polyp was sessile.       The polyp was removed with a cold snare. Resection and retrieval were       complete.      The exam was otherwise without abnormality.      The rectum, sigmoid colon, descending colon, transverse colon, ascending       colon and cecum appeared normal.      The retroflexed view of the distal rectum and anal verge was normal and       showed no anal or rectal abnormalities.      Images were captured correctly but due to provation/software error were       not available at the end for creation of report Impression:            - One 6 mm polyp in the descending colon, removed with                         a cold snare. Resected and retrieved.                        -  The examination was otherwise normal.                        - The rectum, sigmoid colon, descending colon,                         transverse colon, ascending colon and cecum are normal.                        - The distal rectum and anal verge are normal on                         retroflexion view. Recommendation:        - Await pathology results.                        - Discharge patient to home (with escort).                        - Advance diet as tolerated.                         - Continue present medications.                        - Repeat colonoscopy in 5 years, with 2 day prep.                        - The findings and recommendations were discussed with                         the patient.                        - The findings and recommendations were discussed with                         the patient's family.                        - Return to primary care physician as previously                         scheduled. Procedure Code(s):     --- Professional ---                        [phone], Colonoscopy, flexible; with removal of                         tumor(s), polyp(s), or other lesion(s) by snare                         technique Diagnosis Code(s):     --- Professional ---                        Z12.11, Encounter for screening for malignant neoplasm                         of colon  K63.5, Polyp of colon CPT copyright 2019 American Medical Association. All rights reserved. The codes documented in this report are preliminary and upon coder review may  be revised to meet current compliance requirements.  Vonda Antigua, MD Margretta Sidle B. Bonna Gains MD, MD 08/22/2020 9:04:02 AM This report has been signed electronically. Number of Addenda: 0 Note Initiated On: 08/22/2020 7:24 AM Scope Withdrawal Time: 0 hours 22 minutes 37 seconds  Total Procedure Duration: 0 hours 31 minutes 30 seconds  Estimated Blood Loss:  Estimated blood loss: none.      [facility]

## 2020-08-22 NOTE — Transfer of Care (Signed)
Immediate Anesthesia Transfer of Care Note  Patient: Kelly Matthews  Procedure(s) Performed: COLONOSCOPY WITH PROPOFOL (N/A )  Patient Location: PACU  Anesthesia Type:General  Level of Consciousness: awake and sedated  Airway & Oxygen Therapy: Patient Spontanous Breathing and Patient connected to face mask oxygen  Post-op Assessment: Report given to RN and Post -op Vital signs reviewed and stable  Post vital signs: Reviewed and stable  Last Vitals:  Vitals Value Taken Time  BP 95/62 08/22/20 0858  Temp 36.1 C 08/22/20 0857  Pulse 83 08/22/20 0858  Resp 14 08/22/20 0858  SpO2 100 % 08/22/20 0858  Vitals shown include unvalidated device data.  Last Pain:  Vitals:   08/22/20 0857  TempSrc: Temporal  PainSc:          Complications: No complications documented.

## 2020-08-22 NOTE — H&P (Signed)
Vonda Antigua, MD [address], [place], [place], [place], [address] [address], [place], [place], [place], [address] Phone: [phone]  Fax: [phone]  Primary Care Physician:  Margo Common, PA   Pre-Procedure History & Physical: HPI:  Kelly Matthews is a 54 y.o. female is here for a colonoscopy.   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   Diabetes mellitus without complication (HCC)    Fatty liver    Hypertension    Psoriasis (a type of skin inflammation)    Tachycardia     Past Surgical History:  Procedure Laterality Date   ANTERIOR CERVICAL DECOMP/DISCECTOMY FUSION N/A 02/01/2017   Procedure: ANTERIOR CERVICAL DECOMPRESSION/DISCECTOMY FUSION 1 LEVEL;  Surgeon: Bayard Hugger, MD;  Location: [facility] ORS;  Service: Neurosurgery;  Laterality: N/A;   BREAST BIOPSY Left 04/18/2015   Benign calcification   CESAREAN SECTION  1990,2001   COLONOSCOPY WITH PROPOFOL N/A 06/15/2018   Procedure: COLONOSCOPY WITH PROPOFOL;  Surgeon: Virgel Manifold, MD;  Location: [facility] ENDOSCOPY;  Service: Endoscopy;  Laterality: N/A;   ESOPHAGOGASTRODUODENOSCOPY (EGD) WITH PROPOFOL N/A 06/15/2018   Procedure: ESOPHAGOGASTRODUODENOSCOPY (EGD) WITH PROPOFOL;  Surgeon: Virgel Manifold, MD;  Location: [facility] ENDOS[facility];  Service: Endoscopy;  Laterality: N/A;   INTRAUTERINE DEVICE INSERTION  October 2015   [facility]    Prior to Admission medications   Medication Sig Start Date End Date Taking? Authorizing Provider  clobetasol (TEMOVATE) 0.05 % external solution APPLY TWICE DAILY TO SCALP FOR [facility] 02/24/17   [provider]  Clobetasol Propionate 0.05 % shampoo SHAMPOO DAILY. LEAVE IN PLACE FOR 30 MINUTES AND RINSE 02/24/17   [provider]  cyclobenzaprine (FLEXERIL) 5 MG tablet TAKE 1 TABLET BY MOUTH THREE TIMES A DAY AS NEEDED FOR MUSCLE SPASMS 07/08/20   Virginia Crews, MD  Dapagliflozin-Metformin HCl ER  03-999 MG TB24 Take by mouth. 02/22/17   [provider]  diltiazem (DILACOR XR) 120 MG 24 hr capsule Take 120 mg by mouth every morning.  12/03/14   [provider]  diltiazem (TIAZAC) 120 MG 24 hr capsule Take 1 capsule by mouth 1 day or 1 dose. 12/11/19   [provider]  ergocalciferol (VITAMIN D2) 1.25 MG (50000 UT) capsule Take 1 capsule by mouth once a week. 03/25/20   [provider]  fluocinonide (LIDEX) 0.05 % external solution daily. 12/26/19   [provider]  fluticasone (FLONASE) 50 MCG/ACT nasal spray SPRAY 2 SPRAYS IN [facility] NOSTRIL DAILY 11/18/18   Birdie Sons, MD  gabapentin (NEURONTIN) 300 MG capsule Take 1 capsule by mouth 3 (three) times daily. 05/27/20 08/25/20  [provider]  glucose blood (ACCU-CHEK ACTIVE STRIPS) test strip Use as instructed 11/02/17   Chrismon, Vickki Muff, PA  Lancets (ACCU-CHEK SOFT TOUCH) lancets Use as instructed 11/02/17   Chrismon, Vickki Muff, PA  meloxicam (MOBIC) 15 MG tablet TAKE 1 TABLET BY MOUTH EVERY DAY 06/10/20   Virginia Crews, MD  montelukast (SINGULAIR) 10 MG tablet TAKE 1 TABLET BY MOUTH EVERY DAY 07/23/20   Chrismon, Vickki Muff, PA  PARoxetine (PAXIL) 10 MG tablet TAKE 1 TABLET BY MOUTH EVERYDAY AT BEDTIME 01/11/20   Will Bonnet, MD  rOPINIRole (REQUIP) 0.25 MG tablet TAKE 1-2 TABS AT BEDTIME AS DIRECTED 10/31/19   [provider]  telmisartan (MICARDIS) 40 MG tablet Take 40 mg by mouth daily.    [provider]  traZODone (DESYREL) 50 MG tablet TAKE 0.5-1  TABLETS (25-50 MG TOTAL) BY MOUTH AT BEDTIME AS NEEDED FOR SLEEP. 06/08/20   Trinna Post, PA-C    Allergies as of 07/02/2020 - Review Complete 03/20/2020  Allergen Reaction Noted   Oxycodone Itching 02/01/2017   Penicillins Itching 02/05/2020    Family History  Problem Relation Age of Onset   Coronary artery disease Mother    Coronary artery disease Father    Diabetes Father    Emphysema Father      Seizures Daughter    Cancer Maternal Grandmother    Breast cancer Cousin 21    Social History   Socioeconomic History   Marital status: Widowed    Spouse name: Not on file   Number of children: Not on file   Years of education: Not on file   Highest education level: Not on file  Occupational History   Not on file  Tobacco Use   Smoking status: Former Smoker    Years: 1.00    Quit date: 01/19/1985    Years since quitting: 35.6   Smokeless tobacco: Never Used  Vaping Use   Vaping Use: Never used  Substance and Sexual Activity   Alcohol use: No    Alcohol/week: 0.0 standard drinks    Comment: occasionally 1 beer every 6 months   Drug use: No   Sexual activity: Yes    Birth control/protection: I.U.D.  Other Topics Concern   Not on file  Social History Narrative   Not on file   Social Determinants of Health   Financial Resource Strain:    Difficulty of Paying Living Expenses: Not on file  Food Insecurity:    Worried About [place] in the Last Year: Not on file   Ran Out of Food in the Last Year: Not on file  Transportation Needs:    Lack of Transportation (Medical): Not on file   Lack of Transportation (Non-Medical): Not on file  Physical Activity:    Days of Exercise per Week: Not on file   Minutes of Exercise per Session: Not on file  Stress:    Feeling of Stress : Not on file  Social Connections:    Frequency of Communication with Friends and Family: Not on file   Frequency of Social Gatherings with Friends and Family: Not on file   Attends Religious Services: Not on file   Active Member of Clubs or Organizations: Not on file   Attends Archivist Meetings: Not on file   Marital Status: Not on file  Intimate Partner Violence:    Fear of Current or Ex-Partner: Not on file   Emotionally Abused: Not on file   Physically Abused: Not on file   Sexually Abused: Not on file    Review of Systems: See HPI,  otherwise negative ROS  Physical Exam: BP (!) 137/92    Pulse (!) 103    Temp 98 F (36.7 C) (Temporal)    Resp 18    Ht 5' (1.524 m)    Wt 96.6 kg    LMP 04/22/2017 Comment: urine pregnancy test negative   SpO2 100%    BMI 41.60 kg/m  General:   Alert,  pleasant and cooperative in NAD Head:  Normocephalic and atraumatic. Neck:  Supple; no masses or thyromegaly. Lungs:  Clear throughout to auscultation, normal respiratory effort.    Heart:  +S1, +S2, Regular rate and rhythm, No edema. Abdomen:  Soft, nontender and nondistended. Normal bowel sounds, without guarding, and without rebound.  Neurologic:  Alert and  oriented x4;  grossly normal neurologically.  Impression/Plan: Kelly Matthews is here for a colonoscopy to be performed for history of adenoma polyps.  Risks, benefits, limitations, and alternatives regarding  colonoscopy have been reviewed with the patient.  Questions have been answered.  All parties agreeable.   Virgel Manifold, MD  08/22/2020, 8:18 AM

## 2020-08-23 ENCOUNTER — Encounter: Payer: Self-pay | Admitting: Gastroenterology

## 2020-08-23 LAB — SURGICAL PATHOLOGY

## 2020-08-26 NOTE — Anesthesia Postprocedure Evaluation (Signed)
Anesthesia Post Note  Patient: Kelly Matthews  Procedure(s) Performed: COLONOSCOPY WITH PROPOFOL (N/A )  Patient location during evaluation: PACU Anesthesia Type: General Level of consciousness: awake and alert Pain management: pain level controlled Vital Signs Assessment: post-procedure vital signs reviewed and stable Respiratory status: spontaneous breathing, nonlabored ventilation and respiratory function stable Cardiovascular status: blood pressure returned to baseline and stable Postop Assessment: no apparent nausea or vomiting Anesthetic complications: no   No complications documented.   Last Vitals:  Vitals:   08/22/20 0741 08/22/20 0857  BP: (!) 137/92 95/62  Pulse: (!) 103 84  Resp: 18 14  Temp: 36.7 C (!) 36.1 C  SpO2: 100% 100%    Last Pain:  Vitals:   08/22/20 0857  TempSrc: Temporal  PainSc:                  Tera Mater

## 2020-08-27 ENCOUNTER — Other Ambulatory Visit: Payer: Self-pay | Admitting: Physician Assistant

## 2020-08-27 ENCOUNTER — Other Ambulatory Visit: Payer: Self-pay | Admitting: Family Medicine

## 2020-08-27 DIAGNOSIS — M62830 Muscle spasm of back: Secondary | ICD-10-CM

## 2020-08-27 DIAGNOSIS — G4700 Insomnia, unspecified: Secondary | ICD-10-CM

## 2020-08-27 NOTE — Telephone Encounter (Signed)
Requested Prescriptions  Pending Prescriptions Disp Refills   meloxicam (MOBIC) 15 MG tablet [Pharmacy Med Name: MELOXICAM 15 MG TABLET] 90 tablet 0    Sig: TAKE 1 TABLET BY MOUTH EVERY DAY     Analgesics:  COX2 Inhibitors Passed - 08/27/2020  9:59 AM      Passed - HGB in normal range and within 360 days    Hemoglobin  Date Value Ref Range Status  07/12/2020 14.4 12.0 - 15.0 g/dL Final  02/05/2020 14.6 11.1 - 15.9 g/dL Final  02/05/2020 14.4 11.1 - 15.9 g/dL Final         Passed - Cr in normal range and within 360 days    Creatinine, Ser  Date Value Ref Range Status  07/12/2020 0.68 0.44 - 1.00 mg/dL Final   Creatinine, POC  Date Value Ref Range Status  08/06/2017 NA mg/dL Final         Passed - Patient is not pregnant      Passed - Valid encounter within last 12 months    Recent Outpatient Visits          3 months ago Rectal bleeding   [place], [place], PA-C   5 months ago Muscle spasm of back   [place], Dionne Bucy, [place]   6 months ago Muscle cramp   [place], Dionne Bucy, [place]   1 year ago Muscle spasm of back   [place], [place]   1 year ago Fatigue, unspecified type   [place], Vickki Muff, [place]

## 2020-08-27 NOTE — Telephone Encounter (Signed)
Requested Prescriptions  Pending Prescriptions Disp Refills   traZODone (DESYREL) 50 MG tablet [Pharmacy Med Name: TRAZODONE 50 MG TABLET] 90 tablet 0    Sig: TAKE 0.5-1 TABLETS (25-50 MG TOTAL) BY MOUTH AT BEDTIME AS NEEDED FOR SLEEP.     Psychiatry: Antidepressants - Serotonin Modulator Passed - 08/27/2020  9:59 AM      Passed - Valid encounter within last 6 months    Recent Outpatient Visits          3 months ago Rectal bleeding   [place], [place], [place]   5 months ago Muscle spasm of back   [place], Dionne Bucy, [place]   6 months ago Muscle cramp   [place], Dionne Bucy, [place]   1 year ago Muscle spasm of back   [place], [place]   1 year ago Fatigue, unspecified type   [place], Vickki Muff, [place]

## 2020-08-29 ENCOUNTER — Encounter: Payer: Self-pay | Admitting: Gastroenterology

## 2020-09-24 ENCOUNTER — Ambulatory Visit: Payer: Self-pay

## 2020-09-24 NOTE — Telephone Encounter (Signed)
Patient called and says 1-1/2 weeks ago while out of town, she became dizzy with vomiting. She went to the ED where she was and was diagnosed with Vertigo. She says today she is having nausea and wobbly feeling, head spinning, not falling, able to walk. She says she's been having headaches off and on as well, but today no headache, no vomiting. No other symptoms. She asks for an appointment tomorrow, since she's off work. Appointment scheduled for tomorrow, 09/25/20 at 1040 with Carles Collet, PA-C, care advice given, patient verbalized understanding.  Reason for Disposition . [1] MODERATE dizziness (e.g., vertigo; feels very unsteady, interferes with normal activities) AND [2] has NOT been evaluated by physician for this  Answer Assessment - Initial Assessment Questions 1. DESCRIPTION: "Describe your dizziness."     Wobbly, head spinning 2. VERTIGO: "Do you feel like either you or the room is spinning or tilting?"       Yes 3. LIGHTHEADED: "Do you feel lightheaded?" (e.g., somewhat faint, woozy, weak upon standing)     Yes 4. SEVERITY: "How bad is it?"  "Can you walk?"   - MILD: Feels unsteady but walking normally.   - MODERATE: Feels very unsteady when walking, but not falling; interferes with normal activities (e.g., school, work) .   - SEVERE: Unable to walk without falling, or requires assistance to walk without falling.     Moderate 5. ONSET:  "When did the dizziness begin?"     1 1/2 weeks ago 6. AGGRAVATING FACTORS: "Does anything make it worse?" (e.g., standing, change in head position)     Changing positions 7. CAUSE: "What do you think is causing the dizziness?"     No 8. RECURRENT SYMPTOM: "Have you had dizziness before?" If Yes, ask: "When was the last time?" "What happened that time?"     No 9. OTHER SYMPTOMS: "Do you have any other symptoms?" (e.g., headache, weakness, numbness, vomiting, earache)     Nausea, headaches 10. PREGNANCY: "Is there any chance you are pregnant?"  "When was your last menstrual period?"       No  Protocols used: DIZZINESS - VERTIGO-A-AH

## 2020-09-24 NOTE — Progress Notes (Signed)
Established patient visit   Patient: Kelly Matthews   DOB: [birth year]   53 y.o. Female  MRN: [id number] Visit Date: 09/25/2020  Today's healthcare provider: Trinna Post, PA-C   Chief Complaint  Patient presents with  . Dizziness  I,Tianah Lonardo M Dawson Albers,acting as a scribe for Trinna Post, PA-C.,have documented all relevant documentation on the behalf of Trinna Post, PA-C,as directed by  Trinna Post, PA-C while in the presence of Trinna Post, PA-C.  Subjective    Dizziness This is a new problem. The current episode started 1 to 4 weeks ago. The problem occurs intermittently. The problem has been gradually improving. Associated symptoms include nausea, vertigo and vomiting. Pertinent negatives include no headaches, numbness, visual change or weakness.    Patient reports she woke up in the middle of the night a couple of weeks ago very dizzy and had several episodes of vomiting. She felt off balance when she walked. She denies visual changes, weakness. She reports she was evaluated by tele doc and was prescribed meclizine for vertigo which was not helpful. She had continued symptoms and was then seen in the ER in [place] where she was again told she had vertigo and given dramamine. She feels her dramamine worked better. She did not receive medication for nausea.     Medications: Outpatient Medications Prior to Visit  Medication Sig  . clobetasol (TEMOVATE) 0.05 % external solution APPLY TWICE DAILY TO SCALP FOR ITCH  . Clobetasol Propionate 0.05 % shampoo SHAMPOO DAILY. LEAVE IN PLACE FOR 30 MINUTES AND RINSE  . cyclobenzaprine (FLEXERIL) 5 MG tablet TAKE 1 TABLET BY MOUTH THREE TIMES A DAY AS NEEDED FOR MUSCLE SPASMS  . Dapagliflozin-Metformin HCl ER 03-999 MG TB24 Take by mouth.  . diltiazem (DILACOR XR) 120 MG 24 hr capsule Take 120 mg by mouth every morning.   . diltiazem (TIAZAC) 120 MG 24 hr capsule Take 1 capsule by mouth 1 day or 1 dose.  .  ergocalciferol (VITAMIN D2) 1.25 MG (50000 UT) capsule Take 1 capsule by mouth once a week.  . fluocinonide (LIDEX) 0.05 % external solution daily.  . fluticasone (FLONASE) 50 MCG/ACT nasal spray SPRAY 2 SPRAYS IN EACH NOSTRIL DAILY  . glucose blood (ACCU-CHEK ACTIVE STRIPS) test strip Use as instructed  . Lancets (ACCU-CHEK SOFT TOUCH) lancets Use as instructed  . meloxicam (MOBIC) 15 MG tablet TAKE 1 TABLET BY MOUTH EVERY DAY  . montelukast (SINGULAIR) 10 MG tablet TAKE 1 TABLET BY MOUTH EVERY DAY  . PARoxetine (PAXIL) 10 MG tablet TAKE 1 TABLET BY MOUTH EVERYDAY AT BEDTIME  . rOPINIRole (REQUIP) 0.25 MG tablet TAKE 1-2 TABS AT BEDTIME AS DIRECTED  . telmisartan (MICARDIS) 40 MG tablet Take 40 mg by mouth daily.  . traZODone (DESYREL) 50 MG tablet TAKE 0.5-1 TABLETS (25-50 MG TOTAL) BY MOUTH AT BEDTIME AS NEEDED FOR SLEEP.   No facility-administered medications prior to visit.    Review of Systems  Gastrointestinal: Positive for nausea and vomiting.  Neurological: Positive for dizziness and vertigo. Negative for weakness, numbness and headaches.      Objective    BP (!) 113/103 (BP Location: Left Arm, Patient Position: Sitting, Cuff Size: Large)   Pulse 87   Temp 98.5 F (36.9 C) (Oral)   Wt 217 lb 3.2 oz (98.5 kg)   SpO2 100%   BMI 42.42 kg/m    Physical Exam Constitutional:      Appearance: Normal appearance.  HENT:  Right Ear: Tympanic membrane and ear canal normal.     Left Ear: Tympanic membrane and ear canal normal.  Eyes:     Extraocular Movements: Extraocular movements intact.     Pupils: Pupils are equal, round, and reactive to light.  Cardiovascular:     Rate and Rhythm: Normal rate and regular rhythm.     Heart sounds: Normal heart sounds.  Pulmonary:     Effort: Pulmonary effort is normal.     Breath sounds: Normal breath sounds.  Skin:    General: Skin is warm and dry.  Neurological:     Mental Status: She is alert and oriented to person, place,  and time. Mental status is at baseline.  Psychiatric:        Mood and Affect: Mood normal.        Behavior: Behavior normal.       No results found for any visits on 09/25/20.  Assessment & Plan    1. Dizziness  EKG normal. Medicine as below, continue Epley maneuver. Refer to ENT.  - EKG 12-Lead - Ambulatory referral to ENT  2. Vertigo  - Ambulatory referral to ENT - meclizine (ANTIVERT) 25 MG tablet; Take 1 tablet (25 mg total) by mouth 3 (three) times daily as needed for dizziness.  Dispense: 30 tablet; Refill: 0 - promethazine (PHENERGAN) 12.5 MG tablet; Take 1 tablet (12.5 mg total) by mouth every 8 (eight) hours as needed for nausea or vomiting.  Dispense: 20 tablet; Refill: 0  3. Need for COVID-19 vaccine   4. Encounter for immunization  - [employer] Vaccine   No follow-ups on file.      ITrinna Post, PA-C, have reviewed all documentation for this visit. The documentation on 09/25/20 for the exam, diagnosis, procedures, and orders are all accurate and complete.  The entirety of the information documented in the History of Present Illness, Review of Systems and Physical Exam were personally obtained by me. Portions of this information were initially documented by [facility] and reviewed by me for thoroughness and accuracy.     Paulene Floor  [facility] [phone] (phone) [phone] (fax)  [place]

## 2020-09-25 ENCOUNTER — Encounter: Payer: Self-pay | Admitting: Physician Assistant

## 2020-09-25 ENCOUNTER — Other Ambulatory Visit: Payer: Self-pay

## 2020-09-25 ENCOUNTER — Ambulatory Visit (INDEPENDENT_AMBULATORY_CARE_PROVIDER_SITE_OTHER): Payer: No Typology Code available for payment source | Admitting: Physician Assistant

## 2020-09-25 VITALS — BP 113/103 | HR 87 | Temp 98.5°F | Wt 217.2 lb

## 2020-09-25 DIAGNOSIS — R42 Dizziness and giddiness: Secondary | ICD-10-CM | POA: Diagnosis not present

## 2020-09-25 DIAGNOSIS — Z23 Encounter for immunization: Secondary | ICD-10-CM

## 2020-09-25 MED ORDER — PROMETHAZINE HCL 12.5 MG PO TABS: 12.5000 mg | ORAL_TABLET | Freq: Three times a day (TID) | ORAL | 0 refills | Status: DC | PRN

## 2020-09-25 MED ORDER — MECLIZINE HCL 25 MG PO TABS: 25.0000 mg | ORAL_TABLET | Freq: Three times a day (TID) | ORAL | 0 refills | Status: DC | PRN

## 2020-09-25 NOTE — Patient Instructions (Signed)

## 2020-09-30 ENCOUNTER — Other Ambulatory Visit: Payer: Self-pay | Admitting: Physician Assistant

## 2020-09-30 DIAGNOSIS — Z1231 Encounter for screening mammogram for malignant neoplasm of breast: Secondary | ICD-10-CM

## 2020-10-15 ENCOUNTER — Other Ambulatory Visit: Payer: Self-pay | Admitting: Physician Assistant

## 2020-10-15 ENCOUNTER — Other Ambulatory Visit: Payer: Self-pay | Admitting: Family Medicine

## 2020-10-15 DIAGNOSIS — R42 Dizziness and giddiness: Secondary | ICD-10-CM

## 2020-10-15 DIAGNOSIS — M62830 Muscle spasm of back: Secondary | ICD-10-CM

## 2020-10-15 NOTE — Telephone Encounter (Signed)
Requested medication (s) are due for refill today: yes  Requested medication (s) are on the active medication list: yes  Last refill:  07/08/20 #60 with 3 refills  Future visit scheduled: no  Notes to clinic:  Please review for refill. Refill not delegate per protocol.    Requested Prescriptions  Pending Prescriptions Disp Refills   cyclobenzaprine (FLEXERIL) 5 MG tablet [Pharmacy Med Name: CYCLOBENZAPRINE 5 MG TABLET] 60 tablet 3    Sig: TAKE 1 TABLET BY MOUTH THREE TIMES A DAY AS NEEDED FOR MUSCLE SPASMS      Not Delegated - Analgesics:  Muscle Relaxants Failed - 10/15/2020 12:07 PM      Failed - This refill cannot be delegated      Passed - Valid encounter within last 6 months    Recent Outpatient Visits           2 weeks ago [place], [place], PA-C   5 months ago Rectal bleeding   [place], [place], [place]   6 months ago Muscle spasm of back   [place], MD   8 months ago Muscle cramp   [place], Dionne Bucy, MD   1 year ago Muscle spasm of back   [facility], [place], [place]

## 2020-10-15 NOTE — Telephone Encounter (Signed)
Requested medication (s) are due for refill today: no  Requested medication (s) are on the active medication list: no  Last refill:  09/25/2020  Future visit scheduled: no  Notes to clinic:  this refill cannot be delegated    Requested Prescriptions  Pending Prescriptions Disp Refills   meclizine (ANTIVERT) 25 MG tablet [Pharmacy Med Name: MECLIZINE 25 MG TABLET] 30 tablet 0    Sig: TAKE 1 TABLET BY MOUTH 3 TIMES DAILY AS NEEDED FOR DIZZINESS.      Not Delegated - Gastroenterology: Antiemetics Failed - 10/15/2020 12:07 PM      Failed - This refill cannot be delegated      Passed - Valid encounter within last 6 months    Recent Outpatient Visits           2 weeks ago [place], [place], [place]   5 months ago Rectal bleeding   [place], [place], [place]   6 months ago Muscle spasm of back   [place], Dionne Bucy, MD   8 months ago Muscle cramp   [place], Dionne Bucy, MD   1 year ago Muscle spasm of back   [facility], [place] M, PA-C                promethazine (PHENERGAN) 12.5 MG tablet [Pharmacy Med Name: PROMETHAZINE 12.5 MG TABLET] 20 tablet 0    Sig: Take 1 tablet (12.5 mg total) by mouth every 8 (eight) hours as needed for nausea or vomiting.      Not Delegated - Gastroenterology: Antiemetics Failed - 10/15/2020 12:07 PM      Failed - This refill cannot be delegated      Passed - Valid encounter within last 6 months    Recent Outpatient Visits           2 weeks ago [place], [place], [place]   5 months ago Rectal bleeding   [place], [place], [place]   6 months ago Muscle spasm of back   [place], MD   8 months ago Muscle cramp   [place], Dionne Bucy, MD   1 year ago Muscle spasm of back    [facility], [place], [place]

## 2020-10-18 ENCOUNTER — Other Ambulatory Visit: Payer: Self-pay | Admitting: Gastroenterology

## 2020-10-18 ENCOUNTER — Other Ambulatory Visit: Payer: Self-pay | Admitting: Physician Assistant

## 2020-10-18 DIAGNOSIS — K625 Hemorrhage of anus and rectum: Secondary | ICD-10-CM

## 2020-11-18 ENCOUNTER — Other Ambulatory Visit: Payer: Self-pay | Admitting: Physician Assistant

## 2020-11-18 DIAGNOSIS — G4700 Insomnia, unspecified: Secondary | ICD-10-CM

## 2020-11-20 ENCOUNTER — Telehealth: Payer: Self-pay

## 2020-11-20 NOTE — Telephone Encounter (Signed)
Copied from CRM [id number]. Topic: General - Other >> Nov 20, 2020 10:00 AM Jaquita Rector A wrote: Reason for CRM: Patient called to inquire if she can be scheduled for covid testing today. [facility] has no openings until Friday 11/22/20 please advise.  Ph# [phone]

## 2020-11-20 NOTE — Telephone Encounter (Signed)
Why does she need testing? Exposure or symptoms?

## 2020-11-21 ENCOUNTER — Ambulatory Visit: Payer: No Typology Code available for payment source

## 2020-11-21 ENCOUNTER — Telehealth (INDEPENDENT_AMBULATORY_CARE_PROVIDER_SITE_OTHER): Payer: No Typology Code available for payment source | Admitting: Family Medicine

## 2020-11-21 ENCOUNTER — Ambulatory Visit: Payer: No Typology Code available for payment source | Admitting: Family Medicine

## 2020-11-21 DIAGNOSIS — G8929 Other chronic pain: Secondary | ICD-10-CM | POA: Diagnosis not present

## 2020-11-21 DIAGNOSIS — U071 COVID-19: Secondary | ICD-10-CM | POA: Diagnosis not present

## 2020-11-21 DIAGNOSIS — M5442 Lumbago with sciatica, left side: Secondary | ICD-10-CM | POA: Diagnosis not present

## 2020-11-21 MED ORDER — MELOXICAM 15 MG PO TABS: 15.0000 mg | ORAL_TABLET | Freq: Every day | ORAL | 0 refills | Status: DC

## 2020-11-21 MED ORDER — CYCLOBENZAPRINE HCL 5 MG PO TABS: ORAL_TABLET | ORAL | 3 refills | Status: DC

## 2020-11-21 NOTE — Progress Notes (Signed)
MyChart Video Visit    Virtual Visit via Video Note   This visit type was conducted due to national recommendations for restrictions regarding the COVID-19 Pandemic (e.g. social distancing) in an effort to limit this patient's exposure and mitigate transmission in our community. This patient is at least at moderate risk for complications without adequate follow up. This format is felt to be most appropriate for this patient at this time. Physical exam was limited by quality of the video and audio technology used for the visit.   Patient location: home Provider location: office  I discussed the limitations of evaluation and management by telemedicine and the availability of in person appointments. The patient expressed understanding and agreed to proceed.  Patient: Kelly Matthews   DOB: [birth year]   55 y.o. Female  MRN: [id number] Visit Date: 11/21/2020  Today's healthcare provider: Dortha Kern, PA-C   No chief complaint on file.  Subjective    HPI  Patient is a 55 year old female who was originally scheduled for medication refill visit.  She has since developed symptoms of Covid beginning on 11/19/20 and on 11/20/20 tested positive for Covid.  She states that she was on vacation and when she went back to work on 11/18/20 she was told that 14 of her co-workers were out with Covid. She began having symptoms the next day.    Medications: Outpatient Medications Prior to Visit  Medication Sig   clobetasol (TEMOVATE) 0.05 % external solution APPLY TWICE DAILY TO SCALP FOR ITCH   Clobetasol Propionate 0.05 % shampoo SHAMPOO DAILY. LEAVE IN PLACE FOR 30 MINUTES AND RINSE   cyclobenzaprine (FLEXERIL) 5 MG tablet TAKE 1 TABLET BY MOUTH THREE TIMES A DAY AS NEEDED FOR MUSCLE SPASMS   Dapagliflozin-Metformin HCl ER 03-999 MG TB24 Take by mouth.   diltiazem (DILACOR XR) 120 MG 24 hr capsule Take 120 mg by mouth every morning.    diltiazem (TIAZAC) 120 MG 24 hr capsule Take 1 capsule  by mouth 1 day or 1 dose.   ergocalciferol (VITAMIN D2) 1.25 MG (50000 UT) capsule Take 1 capsule by mouth once a week.   fluocinonide (LIDEX) 0.05 % external solution daily.   fluticasone (FLONASE) 50 MCG/ACT nasal spray SPRAY 2 SPRAYS IN EACH NOSTRIL DAILY   glucose blood (ACCU-CHEK ACTIVE STRIPS) test strip Use as instructed   Lancets (ACCU-CHEK SOFT TOUCH) lancets Use as instructed   meclizine (ANTIVERT) 25 MG tablet Take 1 tablet (25 mg total) by mouth 3 (three) times daily as needed for dizziness.   meloxicam (MOBIC) 15 MG tablet TAKE 1 TABLET BY MOUTH EVERY DAY   montelukast (SINGULAIR) 10 MG tablet TAKE 1 TABLET BY MOUTH EVERY DAY   PARoxetine (PAXIL) 10 MG tablet TAKE 1 TABLET BY MOUTH EVERYDAY AT BEDTIME   promethazine (PHENERGAN) 12.5 MG tablet Take 1 tablet (12.5 mg total) by mouth every 8 (eight) hours as needed for nausea or vomiting.   rOPINIRole (REQUIP) 0.25 MG tablet TAKE 1-2 TABS AT BEDTIME AS DIRECTED   telmisartan (MICARDIS) 40 MG tablet Take 40 mg by mouth daily.   traZODone (DESYREL) 50 MG tablet TAKE 0.5-1 TABLETS BY MOUTH AT BEDTIME AS NEEDED FOR SLEEP.   No facility-administered medications prior to visit.    Review of Systems  Constitutional: Positive for chills, diaphoresis and fatigue. Negative for fever.  HENT: Positive for congestion, facial swelling, postnasal drip, rhinorrhea, sinus pressure, sinus pain, sneezing and sore throat. Negative for ear discharge, ear pain and tinnitus.  Respiratory: Positive for cough (productive). Negative for chest tightness, shortness of breath and wheezing.   Cardiovascular: Negative for chest pain.  Gastrointestinal: Positive for abdominal pain and diarrhea. Negative for nausea and vomiting.  Musculoskeletal: Positive for myalgias.  Neurological: Positive for headaches. Negative for dizziness.      Objective    There were no vitals taken for this visit.   Physical Exam:WDWN female in no apparent distress.  Head:  Normocephalic, atraumatic. Neck: Supple, NROM Respiratory: Having some cough and sounding congested. Psych: Normal mood and affect   Assessment & Plan     1. COVID-19 virus infection Developed headache, sore throat, cough congestion and diarrhea on 11-19-20. Several co-workers tested positive for COVID and she tested positive on 11-20-20. Some chills and malaise but no fever, wheeze, dyspnea or loss of taste. Advised to isolate at home with COVID restrictions for 5-7 days. May use Singulair, Mucinex-DM and increase fluid intake. If symptoms worsen, she may need to go to the ER for breathing treatment. Call office if needed.  2. Chronic bilateral low back pain with left-sided sciatica Needing refill of Meloxicam and Cyclobenzaprine. May use Tylenol for COVID symptoms with this regimen if needed. Apply moist heat and rest at home.   No follow-ups on file.     I discussed the assessment and treatment plan with the patient. The patient was provided an opportunity to ask questions and all were answered. The patient agreed with the plan and demonstrated an understanding of the instructions.   The patient was advised to call back or seek an in-person evaluation if the symptoms worsen or if the condition fails to improve as anticipated.  I provided 20 minutes of non-face-to-face time during this encounter.  I, Yeudiel Mateo, PA-C, have reviewed all documentation for this visit. The documentation on 08/08/21 for the exam, diagnosis, procedures, and orders are all accurate and complete.   Dortha Kern, PA-C [employer] [phone] (phone) [phone] (fax)  [facility]

## 2020-11-22 ENCOUNTER — Other Ambulatory Visit: Payer: No Typology Code available for payment source

## 2020-12-11 ENCOUNTER — Other Ambulatory Visit: Payer: Self-pay | Admitting: Family Medicine

## 2020-12-11 DIAGNOSIS — J4520 Mild intermittent asthma, uncomplicated: Secondary | ICD-10-CM

## 2020-12-11 NOTE — Telephone Encounter (Signed)
Requested Prescriptions  Pending Prescriptions Disp Refills  . montelukast (SINGULAIR) 10 MG tablet [Pharmacy Med Name: MONTELUKAST SOD 10 MG TABLET] 90 tablet 1    Sig: TAKE 1 TABLET BY MOUTH EVERY DAY     Pulmonology:  Leukotriene Inhibitors Passed - 12/11/2020 11:52 AM      Passed - Valid encounter within last 12 months    Recent Outpatient Visits          2 weeks ago COVID-19 virus infection   [facility], Vickki Muff, PA-C   2 months ago [place], [place], [place]   6 months ago Rectal bleeding   [place], [place], [place]   8 months ago Muscle spasm of back   [place], [place]   10 months ago Muscle cramp   [facility] [place], Dionne Bucy, MD

## 2020-12-13 ENCOUNTER — Encounter: Payer: Self-pay | Admitting: Family Medicine

## 2020-12-20 ENCOUNTER — Other Ambulatory Visit: Payer: Self-pay

## 2020-12-20 ENCOUNTER — Ambulatory Visit
Admission: RE | Admit: 2020-12-20 | Discharge: 2020-12-20 | Disposition: A | Discharge status: Home / Self Care | Payer: No Typology Code available for payment source | Source: Ambulatory Visit | Attending: Physician Assistant | Admitting: Physician Assistant

## 2020-12-20 DIAGNOSIS — Z1231 Encounter for screening mammogram for malignant neoplasm of breast: Secondary | ICD-10-CM | POA: Insufficient documentation

## 2021-01-06 NOTE — Progress Notes (Signed)
Complete physical exam   Patient: Kelly Matthews   DOB: 11-08-1966   55 y.o. Female  MRN: [id number] Visit Date: 01/07/2021  Today's healthcare provider: Trinna Post, PA-C   Chief Complaint  Patient presents with  . Annual Exam  I,Porsha C McClurkin,acting as a scribe for Trinna Post, PA-C.,have documented all relevant documentation on the behalf of Trinna Post, PA-C,as directed by  Trinna Post, PA-C while in the presence of Trinna Post, PA-C.  Subjective    Kelly Matthews is a 55 y.o. female who presents today for a complete physical exam.  She reports consuming a general diet. Home exercise routine includes walking. She generally feels well. She reports sleeping fairly well. She does have additional problems to discuss today.  HPI   PAP smear UTD. Mammo up to date. Needs labs and vaccination records for new job at the [employer] in [place], [place].   Past Medical History:  Diagnosis Date  . 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 Diabetes mellitus without complication ([place])   . Fatty liver   . Hypertension   . Psoriasis (a type of skin inflammation)   . Tachycardia    Past Surgical History:  Procedure Laterality Date  . ANTERIOR CERVICAL DECOMP/DISCECTOMY FUSION N/A 02/01/2017   Procedure: ANTERIOR CERVICAL DECOMPRESSION/DISCECTOMY FUSION 1 LEVEL;  Surgeon: Bayard Hugger, MD;  Location: [facility];  Service: Neurosurgery;  Laterality: N/A;  . BREAST BIOPSY Left 04/18/2015   Benign calcification  . CESAREAN SECTION  1990,2001  . COLONOSCOPY WITH PROPOFOL N/A 06/15/2018   Procedure: COLONOSCOPY WITH PROPOFOL;  Surgeon: Virgel Manifold, MD;  Location: [facility] END[facility];  Service: Endoscopy;  Laterality: N/A;  . COLONOSCOPY WITH PROPOFOL N/A 08/22/2020   Procedure: COLONOSCOPY WITH PROPOFOL;  Surgeon: Virgel Manifold, MD;  Location: [facility] END[facility];  Service: Endoscopy;  Laterality: N/A;  . ESOPHAGOGASTRODUODENOSCOPY (EGD) WITH  PROPOFOL N/A 06/15/2018   Procedure: ESOPHAGOGASTRODUODENOSCOPY (EGD) WITH PROPOFOL;  Surgeon: Virgel Manifold, MD;  Location: [facility] END[facility];  Service: Endoscopy;  Laterality: N/A;  . INTRAUTERINE DEVICE INSERTION  October 2015   [facility]   Social History   Socioeconomic History  . Marital status: Widowed    Spouse name: Not on file  . Number of children: Not on file  . Years of education: Not on file  . Highest education level: Not on file  Occupational History  . Not on file  Tobacco Use  . Smoking status: Former Smoker    Years: 1.00    Quit date: 01/19/1985    Years since quitting: 35.9  . Smokeless tobacco: Never Used  Vaping Use  . Vaping Use: Never used  Substance and Sexual Activity  . Alcohol use: No    Alcohol/week: 0.0 standard drinks    Comment: occasionally 1 beer every 6 months  . Drug use: No  . Sexual activity: Yes    Birth control/protection: I.U.D.  Other Topics Concern  . Not on file  Social History Narrative  . Not on file   Social Determinants of Health   Financial Resource Strain: Not on file  Food Insecurity: Not on file  Transportation Needs: Not on file  Physical Activity: Not on file  Stress: Not on file  Social Connections: Not on file  Intimate Partner Violence: Not on file   Family Status  Relation Name Status  . Mother  Alive  . Father  Deceased  . Daughter  Alive  . MGM  Deceased  . Sister  Alive  . Brother 1 Alive  . Son  Alive  . MGF  Deceased  . PGM  Deceased  . PGF  Deceased  . Brother 2 Deceased  . Cousin  (Not Specified)   Family History  Problem Relation Age of Onset  . Coronary artery disease Mother   . Coronary artery disease Father   . Diabetes Father   . Emphysema Father   . Seizures Daughter   . Cancer Maternal Grandmother   . Breast cancer Cousin 50   Allergies  Allergen Reactions  . Oxycodone Itching  . Penicillins Itching    Patient Care Team: Chrismon, Driscilla Grammes as PCP - General  (Physician Assistant)   Medications: Outpatient Medications Prior to Visit  Medication Sig  . clobetasol (TEMOVATE) 0.05 % external solution APPLY TWICE DAILY TO SCALP FOR ITCH  . Clobetasol Propionate 0.05 % shampoo SHAMPOO DAILY. LEAVE IN PLACE FOR 30 MINUTES AND RINSE  . cyclobenzaprine (FLEXERIL) 5 MG tablet TAKE 1 TABLET BY MOUTH THREE TIMES A DAY AS NEEDED FOR MUSCLE SPASMS  . Dapagliflozin-Metformin HCl ER 03-999 MG TB24 Take by mouth.  . diltiazem (DILACOR XR) 120 MG 24 hr capsule Take 120 mg by mouth every morning.   . diltiazem (TIAZAC) 120 MG 24 hr capsule Take 1 capsule by mouth 1 day or 1 dose.  . fluocinonide (LIDEX) 0.05 % external solution daily.  . fluticasone (FLONASE) 50 MCG/ACT nasal spray SPRAY 2 SPRAYS IN EACH NOSTRIL DAILY  . glucose blood (ACCU-CHEK ACTIVE STRIPS) test strip Use as instructed  . Lancets (ACCU-CHEK SOFT TOUCH) lancets Use as instructed  . meclizine (ANTIVERT) 25 MG tablet Take 1 tablet (25 mg total) by mouth 3 (three) times daily as needed for dizziness.  . meloxicam (MOBIC) 15 MG tablet Take 1 tablet (15 mg total) by mouth daily.  . montelukast (SINGULAIR) 10 MG tablet TAKE 1 TABLET BY MOUTH EVERY DAY  . PARoxetine (PAXIL) 10 MG tablet TAKE 1 TABLET BY MOUTH EVERYDAY AT BEDTIME  . telmisartan (MICARDIS) 40 MG tablet Take 40 mg by mouth daily.  . traZODone (DESYREL) 50 MG tablet TAKE 0.5-1 TABLETS BY MOUTH AT BEDTIME AS NEEDED FOR SLEEP.  Marland Kitchen ergocalciferol (VITAMIN D2) 1.25 MG (50000 UT) capsule Take 1 capsule by mouth once a week. (Patient not taking: Reported on 01/07/2021)  . promethazine (PHENERGAN) 12.5 MG tablet Take 1 tablet (12.5 mg total) by mouth every 8 (eight) hours as needed for nausea or vomiting. (Patient not taking: Reported on 01/07/2021)  . rOPINIRole (REQUIP) 0.25 MG tablet TAKE 1-2 TABS AT BEDTIME AS DIRECTED (Patient not taking: Reported on 01/07/2021)   No facility-administered medications prior to visit.    Review of Systems   Constitutional: Negative.   HENT: Negative.   Eyes: Negative.   Respiratory: Negative.   Cardiovascular: Negative.   Gastrointestinal: Negative.   Endocrine: Negative.   Genitourinary: Negative.   Musculoskeletal: Negative.   Skin: Negative.   Allergic/Immunologic: Negative.   Neurological: Negative.   Hematological: Negative.   Psychiatric/Behavioral: Negative.       Objective    BP 134/87 (BP Location: Left Arm, Patient Position: Sitting, Cuff Size: Large)   Pulse 84   Temp 98.1 F (36.7 C) (Oral)   Ht 5' (1.524 m)   Wt 218 lb 14.4 oz (99.3 kg)   SpO2 100%   BMI 42.75 kg/m    Physical Exam Constitutional:      Appearance: Normal appearance.  HENT:  Right Ear: Tympanic membrane, ear canal and external ear normal.     Left Ear: Tympanic membrane, ear canal and external ear normal.  Cardiovascular:     Rate and Rhythm: Normal rate and regular rhythm.     Pulses: Normal pulses.     Heart sounds: Normal heart sounds.  Pulmonary:     Effort: Pulmonary effort is normal.     Breath sounds: Normal breath sounds.  Abdominal:     General: Abdomen is flat. Bowel sounds are normal.     Palpations: Abdomen is soft.  Skin:    General: Skin is warm and dry.  Neurological:     General: No focal deficit present.     Mental Status: She is alert and oriented to person, place, and time.  Psychiatric:        Mood and Affect: Mood normal.        Behavior: Behavior normal.       Last depression screening scores PHQ 2/9 Scores 01/07/2021 09/25/2020 03/20/2020  PHQ - 2 Score 0 0 0  PHQ- 9 Score 1 0 6   Last fall risk screening Fall Risk  01/07/2021  Falls in the past year? 0  Number falls in past yr: 0  Injury with Fall? 0  Risk for fall due to : No Fall Risks  Follow up Falls evaluation completed   Last Audit-C alcohol use screening Alcohol Use Disorder Test (AUDIT) 01/07/2021  1. How often do you have a drink containing alcohol? 1  2. How many drinks containing  alcohol do you have on a typical day when you are drinking? 0  3. How often do you have six or more drinks on one occasion? 0  AUDIT-C Score 1   A score of 3 or more in women, and 4 or more in men indicates increased risk for alcohol abuse, EXCEPT if all of the points are from question 1   No results found for any visits on 01/07/21.  Assessment & Plan    Routine Health Maintenance and Physical Exam  Exercise Activities and Dietary recommendations Goals   None     Immunization History  Administered Date(s) Administered  . Hepatitis A 08/31/2012, 03/01/2013  . Hepatitis B 08/31/2012, 11/01/2012, 03/01/2013  . Influenza, High Dose Seasonal PF 11/30/2016  . Influenza,inj,Quad PF,6+ Mos 12/07/2016, 08/06/2017  . PFIZER(Purple Top)SARS-COV-2 Vaccination 02/26/2020, 03/18/2020, 09/25/2020  . Pneumococcal Polysaccharide-23 05/01/2014  . Tdap 01/12/2011  . Zoster 08/09/2020    Health Maintenance  Topic Date Due  . FOOT EXAM  01/08/2018  . HEMOGLOBIN A1C  03/04/2018  . OPHTHALMOLOGY EXAM  01/03/2019  . INFLUENZA VACCINE  06/16/2020  . TETANUS/TDAP  01/12/2021  . PAP SMEAR-Modifier  10/25/2021  . MAMMOGRAM  12/20/2022  . COLONOSCOPY (Pts 45-24yrs Insurance coverage will need to be confirmed)  08/22/2025  . PNEUMOCOCCAL POLYSACCHARIDE VACCINE AGE 56-64 HIGH RISK  Completed  . COVID-19 Vaccine  Completed  . Hepatitis C Screening  Completed  . HIV Screening  Completed    Discussed health benefits of physical activity, and encouraged her to engage in regular exercise appropriate for her age and condition.  1. Annual physical exam   2. Essential (primary) hypertension  - TSH - Lipid panel - Comprehensive metabolic panel - CBC with Differential/Platelet - Hemoglobin A1c  3. Screening-pulmonary TB  - QuantiFERON-TB Gold Plus  4. Type 2 diabetes mellitus with other specified complication, without long-term current use of insulin ([place])  - Ambulatory referral to  Ophthalmology  5. Need for tetanus booster  - Tdap vaccine greater than or equal to 7yo IM  6. No history of vaccination for measles, mumps, rubella (MMR)  - Measles/Mumps/Rubella Immunity  7. Poliovirus vaccination status unknown  - Poliovirus (1,3) Abs, Neutraliz.  8. Varicella vaccination status unknown  - Varicella Zoster Abs, IgG/IgM   No follow-ups on file.     ITrinna Post, PA-C, have reviewed all documentation for this visit. The documentation on 01/07/21 for the exam, diagnosis, procedures, and orders are all accurate and complete.  The entirety of the information documented in the History of Present Illness, Review of Systems and Physical Exam were personally obtained by me. Portions of this information were initially documented by [facility] and reviewed by me for thoroughness and accuracy.     Paulene Floor  [facility] [phone] (phone) [phone] (fax)  [place]

## 2021-01-07 ENCOUNTER — Other Ambulatory Visit: Payer: Self-pay

## 2021-01-07 ENCOUNTER — Encounter: Payer: Self-pay | Admitting: Physician Assistant

## 2021-01-07 ENCOUNTER — Ambulatory Visit (INDEPENDENT_AMBULATORY_CARE_PROVIDER_SITE_OTHER): Payer: No Typology Code available for payment source | Admitting: Physician Assistant

## 2021-01-07 VITALS — BP 134/87 | HR 84 | Temp 98.1°F | Ht 60.0 in | Wt 218.9 lb

## 2021-01-07 DIAGNOSIS — Z111 Encounter for screening for respiratory tuberculosis: Secondary | ICD-10-CM

## 2021-01-07 DIAGNOSIS — Z23 Encounter for immunization: Secondary | ICD-10-CM | POA: Diagnosis not present

## 2021-01-07 DIAGNOSIS — Z789 Other specified health status: Secondary | ICD-10-CM

## 2021-01-07 DIAGNOSIS — I10 Essential (primary) hypertension: Secondary | ICD-10-CM

## 2021-01-07 DIAGNOSIS — E1169 Type 2 diabetes mellitus with other specified complication: Secondary | ICD-10-CM

## 2021-01-07 DIAGNOSIS — Z0000 Encounter for general adult medical examination without abnormal findings: Secondary | ICD-10-CM | POA: Diagnosis not present

## 2021-01-09 ENCOUNTER — Telehealth: Payer: Self-pay

## 2021-01-09 LAB — CBC WITH DIFFERENTIAL/PLATELET
Basophils Absolute: 0.1 10*3/uL (ref 0.0–0.2)
Basos: 1 %
EOS (ABSOLUTE): 0.1 10*3/uL (ref 0.0–0.4)
Eos: 1 %
Hematocrit: 41.5 % (ref 34.0–46.6)
Hemoglobin: 14 g/dL (ref 11.1–15.9)
Immature Grans (Abs): 0 10*3/uL (ref 0.0–0.1)
Immature Granulocytes: 0 %
Lymphocytes Absolute: 1.9 10*3/uL (ref 0.7–3.1)
Lymphs: 22 %
MCH: 27.9 pg (ref 26.6–33.0)
MCHC: 33.7 g/dL (ref 31.5–35.7)
MCV: 83 fL (ref 79–97)
Monocytes Absolute: 0.5 10*3/uL (ref 0.1–0.9)
Monocytes: 6 %
Neutrophils Absolute: 6.1 10*3/uL (ref 1.4–7.0)
Neutrophils: 70 %
Platelets: 175 10*3/uL (ref 150–450)
RBC: 5.01 x10E6/uL (ref 3.77–5.28)
RDW: 13.6 % (ref 11.7–15.4)
WBC: 8.6 10*3/uL (ref 3.4–10.8)

## 2021-01-09 LAB — QUANTIFERON-TB GOLD PLUS
QuantiFERON Mitogen Value: >10 IU/mL
QuantiFERON Nil Value: 0.08 IU/mL
QuantiFERON TB1 Ag Value: 0.2 IU/mL
QuantiFERON TB2 Ag Value: 0.16 IU/mL
QuantiFERON-TB Gold Plus: NEGATIVE

## 2021-01-09 LAB — COMPREHENSIVE METABOLIC PANEL
ALT: 109 IU/L — ABNORMAL HIGH (ref 0–32)
AST: 55 IU/L — ABNORMAL HIGH (ref 0–40)
Albumin/Globulin Ratio: 1.9 (ref 1.2–2.2)
Albumin: 4.9 g/dL (ref 3.8–4.9)
Alkaline Phosphatase: 120 IU/L (ref 44–121)
BUN/Creatinine Ratio: 16 (ref 9–23)
BUN: 9 mg/dL (ref 6–24)
Bilirubin Total: 0.5 mg/dL (ref 0.0–1.2)
CO2: 22 mmol/L (ref 20–29)
Calcium: 10.3 mg/dL — ABNORMAL HIGH (ref 8.7–10.2)
Chloride: 103 mmol/L (ref 96–106)
Creatinine, Ser: 0.57 mg/dL (ref 0.57–1.00)
GFR calc Af Amer: 122 mL/min/{1.73_m2} (ref >59)
GFR calc non Af Amer: 106 mL/min/{1.73_m2} (ref >59)
Globulin, Total: 2.6 g/dL (ref 1.5–4.5)
Glucose: 81 mg/dL (ref 65–99)
Potassium: 4.3 mmol/L (ref 3.5–5.2)
Sodium: 139 mmol/L (ref 134–144)
Total Protein: 7.5 g/dL (ref 6.0–8.5)

## 2021-01-09 LAB — VARICELLA ZOSTER ABS, IGG/IGM
Varicella IgM: <0.91 index (ref 0.00–0.90)
Varicella zoster IgG: 2218 index (ref >165)

## 2021-01-09 LAB — LIPID PANEL
Chol/HDL Ratio: 2.9 ratio (ref 0.0–4.4)
Cholesterol, Total: 163 mg/dL (ref 100–199)
HDL: 56 mg/dL (ref >39)
LDL Chol Calc (NIH): 96 mg/dL (ref 0–99)
Triglycerides: 53 mg/dL (ref 0–149)
VLDL Cholesterol Cal: 11 mg/dL (ref 5–40)

## 2021-01-09 LAB — MEASLES/MUMPS/RUBELLA IMMUNITY
MUMPS ABS, IGG: <9 AU/mL — ABNORMAL LOW (ref >10.9)
RUBEOLA AB, IGG: 40.4 AU/mL (ref >16.4)
Rubella Antibodies, IGG: 1.92 index (ref >0.99)

## 2021-01-09 LAB — HEMOGLOBIN A1C
Est. average glucose Bld gHb Est-mCnc: 134 mg/dL
Hgb A1c MFr Bld: 6.3 % — ABNORMAL HIGH (ref 4.8–5.6)

## 2021-01-09 LAB — TSH: TSH: 1.7 u[IU]/mL (ref 0.450–4.500)

## 2021-01-09 NOTE — Telephone Encounter (Signed)
-----  Message from Trinna Post, [place] sent at 01/09/2021  8:14 AM EST ----- Sugars controlled. Liver enzymes slightly up. Should recheck at next visit, please avoid any tylenol or alcohol. Immune to measles, rubella, and chickenpocks. Not immune to mumps, she can consider getting the MMR series again for this. TB test pending, can we include results on her form and let patient know we are just waiting on one more test?

## 2021-01-09 NOTE — Telephone Encounter (Signed)
-----   Message from Trinna Post, [place] sent at 01/09/2021 12:58 PM EST ----- TB negative. Can we fill out form and I will sign? We can call her to pick up.

## 2021-01-09 NOTE — Telephone Encounter (Signed)
Patient returned call and was advised of her results. Patient verbalized understanding.

## 2021-01-09 NOTE — Telephone Encounter (Signed)
Called patient and no answer, LVMTRC. If patient calls back okay for PEC to advise. 

## 2021-01-09 NOTE — Telephone Encounter (Signed)
Patient was advised and states she will come tomorrow, 01/10/2021 to pick up form and lab results.

## 2021-01-13 ENCOUNTER — Ambulatory Visit: Payer: No Typology Code available for payment source

## 2021-01-14 ENCOUNTER — Other Ambulatory Visit: Payer: Self-pay

## 2021-01-14 ENCOUNTER — Ambulatory Visit (LOCAL_COMMUNITY_HEALTH_CENTER): Payer: Medicaid Other

## 2021-01-14 DIAGNOSIS — Z23 Encounter for immunization: Secondary | ICD-10-CM | POA: Diagnosis not present

## 2021-01-14 NOTE — Progress Notes (Signed)
MMR given; tolerated well Aileen Fass, RN

## 2021-01-23 ENCOUNTER — Other Ambulatory Visit: Payer: Self-pay | Admitting: Obstetrics and Gynecology

## 2021-01-23 DIAGNOSIS — R232 Flushing: Secondary | ICD-10-CM

## 2021-02-06 ENCOUNTER — Other Ambulatory Visit: Payer: Self-pay | Admitting: Obstetrics and Gynecology

## 2021-02-06 DIAGNOSIS — R232 Flushing: Secondary | ICD-10-CM

## 2021-02-08 ENCOUNTER — Other Ambulatory Visit: Payer: Self-pay | Admitting: Physician Assistant

## 2021-02-08 DIAGNOSIS — G4700 Insomnia, unspecified: Secondary | ICD-10-CM

## 2021-02-10 NOTE — Telephone Encounter (Signed)
Requested Prescriptions  Pending Prescriptions Disp Refills  . traZODone (DESYREL) 50 MG tablet [Pharmacy Med Name: TRAZODONE 50 MG TABLET] 90 tablet 0    Sig: TAKE 1/2 TO 1 TABLET BY MOUTH AT BEDTIME AS NEEDED FOR SLEEP     Psychiatry: Antidepressants - Serotonin Modulator Passed - 02/08/2021  9:35 AM      Passed - Valid encounter within last 6 months    Recent Outpatient Visits          1 month ago Annual physical exam   [facility] Trinna Post, PA-C   2 months ago COVID-19 virus infection   [place], PA-C   4 months ago [place], [place], [place]   8 months ago Rectal bleeding   [place], [place], [place]   10 months ago Muscle spasm of back   [facility], Dionne Bucy, MD      Future Appointments            In 1 month Chrismon, Vickki Muff, PA-C [employer] [employer], [place]

## 2021-02-20 ENCOUNTER — Telehealth: Payer: No Typology Code available for payment source | Admitting: Physician Assistant

## 2021-02-20 ENCOUNTER — Ambulatory Visit: Payer: Self-pay

## 2021-02-20 DIAGNOSIS — J209 Acute bronchitis, unspecified: Secondary | ICD-10-CM

## 2021-02-20 MED ORDER — DOXYCYCLINE HYCLATE 100 MG PO TABS: 100.0000 mg | ORAL_TABLET | Freq: Two times a day (BID) | ORAL | 0 refills | Status: DC

## 2021-02-20 MED ORDER — BENZONATATE 100 MG PO CAPS: 100.0000 mg | ORAL_CAPSULE | Freq: Three times a day (TID) | ORAL | 0 refills | Status: DC | PRN

## 2021-02-20 NOTE — Telephone Encounter (Signed)
Pt. Started coughing Monday. Had telehealth visit through her insurance. Prescribed Tessalon Perles and albuterol inhaler. Cough is worse with green mucus and wheezing. Had a fever Tuesday 103. Has been 99 since then. Feels worse. No availability in the practice per [place]. Recommends telehealth visit through My Chart. Jiles Garter states if pt. Has difficulty with that, call back. Pt. Verbalizes understanding.  Reason for Disposition . [1] Continuous (nonstop) coughing interferes with work or school AND [2] no improvement using cough treatment per Care Advice  Answer Assessment - Initial Assessment Questions 1. ONSET: "When did the cough begin?"      Monday 2. SEVERITY: "How bad is the cough today?"      Moderate 3. SPUTUM: "Describe the color of your sputum" (none, dry cough; clear, white, yellow, green)     Green 4. HEMOPTYSIS: "Are you coughing up any blood?" If so ask: "How much?" (flecks, streaks, tablespoons, etc.)     No 5. DIFFICULTY BREATHING: "Are you having difficulty breathing?" If Yes, ask: "How bad is it?" (e.g., mild, moderate, severe)    - MILD: No SOB at rest, mild SOB with walking, speaks normally in sentences, can lay down, no retractions, pulse < 100.    - MODERATE: SOB at rest, SOB with minimal exertion and prefers to sit, cannot lie down flat, speaks in phrases, mild retractions, audible wheezing, pulse 100-120.    - SEVERE: Very SOB at rest, speaks in single words, struggling to breathe, sitting hunched forward, retractions, pulse > 120      No 6. FEVER: "Do you have a fever?" If Yes, ask: "What is your temperature, how was it measured, and when did it start?"     Up and down 7. CARDIAC HISTORY: "Do you have any history of heart disease?" (e.g., heart attack, congestive heart failure)      Heart surgery - ablation 8. LUNG HISTORY: "Do you have any history of lung disease?"  (e.g., pulmonary embolus, asthma, emphysema)     No 9. PE RISK FACTORS: "Do you have a history of blood  clots?" (or: recent major surgery, recent prolonged travel, bedridden)     No 10. OTHER SYMPTOMS: "Do you have any other symptoms?" (e.g., runny nose, wheezing, chest pain)       Wheezing 11. PREGNANCY: "Is there any chance you are pregnant?" "When was your last menstrual period?"       No 12. TRAVEL: "Have you traveled out of the country in the last month?" (e.g., travel history, exposures)       No  Protocols used: [place]

## 2021-02-20 NOTE — Progress Notes (Signed)
I have spent 5 minutes in review of e-visit questionnaire, review and updating patient chart, medical decision making and response to patient.   Evonne Rinks Cody Icess Bertoni, PA-C    

## 2021-02-20 NOTE — Progress Notes (Signed)
We are sorry that you are not feeling well.  Here is how we plan to help!  Based on your presentation I believe you most likely have A cough due to bacteria.  When patients have a productive cough with a change in color or increased sputum production, we are concerned about bacterial bronchitis.  If left untreated it can progress to pneumonia.  If your symptoms do not improve with your treatment plan it is important that you contact your provider.   I have prescribed Doxycycline 100 mg twice a day for 7 days     In addition you may use A prescription cough medication called Tessalon Perles 100mg . You may take 1-2 capsules every 8 hours as needed for your cough. A prescription has been sent in.  I recommend OTC plain Mucinex to help thin your congestion.    From your responses in the eVisit questionnaire you describe inflammation in the upper respiratory tract which is causing a significant cough.  This is commonly called Bronchitis and has four common causes:    Allergies  Viral Infections  Acid Reflux  Bacterial Infection Allergies, viruses and acid reflux are treated by controlling symptoms or eliminating the cause. An example might be a cough caused by taking certain blood pressure medications. You stop the cough by changing the medication. Another example might be a cough caused by acid reflux. Controlling the reflux helps control the cough.  USE OF BRONCHODILATOR ("RESCUE") INHALERS: There is a risk from using your bronchodilator too frequently.  The risk is that over-reliance on a medication which only relaxes the muscles surrounding the breathing tubes can reduce the effectiveness of medications prescribed to reduce swelling and congestion of the tubes themselves.  Although you feel brief relief from the bronchodilator inhaler, your asthma may actually be worsening with the tubes becoming more swollen and filled with mucus.  This can delay other crucial treatments, such as oral steroid  medications. If you need to use a bronchodilator inhaler daily, several times per day, you should discuss this with your provider.  There are probably better treatments that could be used to keep your asthma under control.     HOME CARE . Only take medications as instructed by your medical team. . Complete the entire course of an antibiotic. . Drink plenty of fluids and get plenty of rest. . Avoid close contacts especially the very young and the elderly . Cover your mouth if you cough or cough into your sleeve. . Always remember to wash your hands . A steam or ultrasonic humidifier can help congestion.   GET HELP RIGHT AWAY IF: . You develop worsening fever. . You become short of breath . You cough up blood. . Your symptoms persist after you have completed your treatment plan MAKE SURE YOU   Understand these instructions.  Will watch your condition.  Will get help right away if you are not doing well or get worse.  Your e-visit answers were reviewed by a board certified advanced clinical practitioner to complete your personal care plan.  Depending on the condition, your plan could have included both over the counter or prescription medications. If there is a problem please reply  once you have received a response from your provider. Your safety is important to [place].  If you have drug allergies check your prescription carefully.    You can use MyChart to ask questions about today's visit, request a non-urgent call back, or ask for a work or school excuse for  24 hours related to this e-Visit. If it has been greater than 24 hours you will need to follow up with your provider, or enter a new e-Visit to address those concerns. You will get an e-mail in the next two days asking about your experience.  I hope that your e-visit has been valuable and will speed your recovery. Thank you for using e-visits.

## 2021-03-01 ENCOUNTER — Other Ambulatory Visit: Payer: Self-pay | Admitting: Family Medicine

## 2021-03-01 DIAGNOSIS — G8929 Other chronic pain: Secondary | ICD-10-CM

## 2021-03-01 NOTE — Telephone Encounter (Signed)
Requested medication (s) are due for refill today: yes  Requested medication (s) are on the active medication list: yes  Last refill:  11/21/20  Future visit scheduled: yes  Notes to clinic:  med not delegated to NT to RF   Requested Prescriptions  Pending Prescriptions Disp Refills   cyclobenzaprine (FLEXERIL) 5 MG tablet [Pharmacy Med Name: CYCLOBENZAPRINE 5 MG TABLET] 60 tablet 3    Sig: TAKE 1 TABLET BY MOUTH THREE TIMES A DAY AS NEEDED FOR MUSCLE SPASMS      Not Delegated - Analgesics:  Muscle Relaxants Failed - 03/01/2021 10:32 AM      Failed - This refill cannot be delegated      Passed - Valid encounter within last 6 months    Recent Outpatient Visits           1 month ago Annual physical exam   [facility] Carles Collet M, PA-C   3 months ago COVID-19 virus infection   [facility], Vickki Muff, PA-C   5 months ago [place], [place], [place]   9 months ago Rectal bleeding   [place], [place], [place]   11 months ago Muscle spasm of back   [place], Dionne Bucy, MD       Future Appointments             In 1 month Chrismon, Vickki Muff, PA-C [employer], [place]

## 2021-03-12 ENCOUNTER — Telehealth: Payer: No Typology Code available for payment source | Admitting: Nurse Practitioner

## 2021-03-12 DIAGNOSIS — J0100 Acute maxillary sinusitis, unspecified: Secondary | ICD-10-CM

## 2021-03-12 MED ORDER — LEVOFLOXACIN 500 MG PO TABS: 500.0000 mg | ORAL_TABLET | Freq: Every day | ORAL | 0 refills | Status: DC

## 2021-03-12 NOTE — Progress Notes (Signed)
We are sorry that you are not feeling well.  Here is how we plan to help!  Based on what you have shared with me it looks like you have sinusitis.  Sinusitis is inflammation and infection in the sinus cavities of the head.  Based on your presentation I believe you most likely have Acute Bacterial Sinusitis.  This is an infection caused by bacteria and is treated with antibiotics. I have prescribed Levofloxicin 500mg by mouth once daily for 7 days. You may use an oral decongestant such as Mucinex D or if you have glaucoma or high blood pressure use plain Mucinex. Saline nasal spray help and can safely be used as often as needed for congestion.  If you develop worsening sinus pain, fever or notice severe headache and vision changes, or if symptoms are not better after completion of antibiotic, please schedule an appointment with a health care provider.    Sinus infections are not as easily transmitted as other respiratory infection, however we still recommend that you avoid close contact with loved ones, especially the very young and elderly.  Remember to wash your hands thoroughly throughout the day as this is the number one way to prevent the spread of infection!  Home Care:  Only take medications as instructed by your medical team.  Complete the entire course of an antibiotic.  Do not take these medications with alcohol.  A steam or ultrasonic humidifier can help congestion.  You can place a towel over your head and breathe in the steam from hot water coming from a faucet.  Avoid close contacts especially the very young and the elderly.  Cover your mouth when you cough or sneeze.  Always remember to wash your hands.  Get Help Right Away If:  You develop worsening fever or sinus pain.  You develop a severe head ache or visual changes.  Your symptoms persist after you have completed your treatment plan.  Make sure you  Understand these instructions.  Will watch your  condition.  Will get help right away if you are not doing well or get worse.  Your e-visit answers were reviewed by a board certified advanced clinical practitioner to complete your personal care plan.  Depending on the condition, your plan could have included both over the counter or prescription medications.  If there is a problem please reply  once you have received a response from your provider.  Your safety is important to us.  If you have drug allergies check your prescription carefully.    You can use MyChart to ask questions about today's visit, request a non-urgent call back, or ask for a work or school excuse for 24 hours related to this e-Visit. If it has been greater than 24 hours you will need to follow up with your provider, or enter a new e-Visit to address those concerns.  You will get an e-mail in the next two days asking about your experience.  I hope that your e-visit has been valuable and will speed your recovery. Thank you for using e-visits. 5-10 minutes spent reviewing and documenting in chart.   

## 2021-03-19 ENCOUNTER — Telehealth: Payer: Self-pay | Admitting: Family Medicine

## 2021-03-19 NOTE — Telephone Encounter (Signed)
Per [facility], client has received one dose of MMR (01/2021). Call to client to counsel regarding above and recommendation for second MMR. Left message to call with number to call provided. Rich Number, RN

## 2021-03-20 NOTE — Telephone Encounter (Signed)
Phone call to pt. Left message that RN with [facility] is trying to get in touch with her about TR and immunization appt if desired. Please call us back at [phone].

## 2021-03-21 NOTE — Telephone Encounter (Signed)
Pt scheduled appt for 04/01/21 - immunizations.

## 2021-03-25 ENCOUNTER — Telehealth: Payer: Self-pay | Admitting: Emergency Medicine

## 2021-03-25 ENCOUNTER — Ambulatory Visit: Payer: Medicaid Other

## 2021-03-25 DIAGNOSIS — R059 Cough, unspecified: Secondary | ICD-10-CM

## 2021-03-25 MED ORDER — BENZONATATE 100 MG PO CAPS: 100.0000 mg | ORAL_CAPSULE | Freq: Two times a day (BID) | ORAL | 0 refills | Status: DC | PRN

## 2021-03-25 MED ORDER — ALBUTEROL SULFATE HFA 108 (90 BASE) MCG/ACT IN AERS: 2.0000 | INHALATION_SPRAY | RESPIRATORY_TRACT | 0 refills | Status: DC | PRN

## 2021-03-25 NOTE — Progress Notes (Signed)
We are sorry that you are not feeling well.  Here is how we plan to help!  Based on your presentation I believe you most likely have A cough due to a virus.  This is called viral bronchitis and is best treated by rest, plenty of fluids and control of the cough.  You may use Ibuprofen or Tylenol as directed to help your symptoms.     In addition you may use A prescription cough medication called Tessalon Perles 100mg . You may take 1-2 capsules every 8 hours as needed for your cough.  Providers prescribe antibiotics to treat infections caused by bacteria. Antibiotics are very powerful in treating bacterial infections when they are used properly. To maintain their effectiveness, they should be used only when necessary. Overuse of antibiotics has resulted in the development of superbugs that are resistant to treatment!    After careful review of your answers, I would not recommend an antibiotic for your condition.  Antibiotics are not effective against viruses and therefore should not be used to treat them. Common examples of infections caused by viruses include colds and flu    From your responses in the eVisit questionnaire you describe inflammation in the upper respiratory tract which is causing a significant cough.  This is commonly called Bronchitis and has four common causes:    Allergies  Viral Infections  Acid Reflux  Bacterial Infection Allergies, viruses and acid reflux are treated by controlling symptoms or eliminating the cause. An example might be a cough caused by taking certain blood pressure medications. You stop the cough by changing the medication. Another example might be a cough caused by acid reflux. Controlling the reflux helps control the cough.  USE OF BRONCHODILATOR ("RESCUE") INHALERS: There is a risk from using your bronchodilator too frequently.  The risk is that over-reliance on a medication which only relaxes the muscles surrounding the breathing tubes can reduce the  effectiveness of medications prescribed to reduce swelling and congestion of the tubes themselves.  Although you feel brief relief from the bronchodilator inhaler, your asthma may actually be worsening with the tubes becoming more swollen and filled with mucus.  This can delay other crucial treatments, such as oral steroid medications. If you need to use a bronchodilator inhaler daily, several times per day, you should discuss this with your provider.  There are probably better treatments that could be used to keep your asthma under control.       HOME CARE . Only take medications as instructed by your medical team. . Complete the entire course of an antibiotic. . Drink plenty of fluids and get plenty of rest. . Avoid close contacts especially the very young and the elderly . Cover your mouth if you cough or cough into your sleeve. . Always remember to wash your hands . A steam or ultrasonic humidifier can help congestion.   GET HELP RIGHT AWAY IF: . You develop worsening fever. . You become short of breath . You cough up blood. . Your symptoms persist after you have completed your treatment plan MAKE SURE YOU   Understand these instructions.  Will watch your condition.  Will get help right away if you are not doing well or get worse.  Your e-visit answers were reviewed by a board certified advanced clinical practitioner to complete your personal care plan.  Depending on the condition, your plan could have included both over the counter or prescription medications. If there is a problem please reply  once you have  received a response from your provider. Your safety is important to [place].  If you have drug allergies check your prescription carefully.    You can use MyChart to ask questions about today's visit, request a non-urgent call back, or ask for a work or school excuse for 24 hours related to this e-Visit. If it has been greater than 24 hours you will need to follow up with your  provider, or enter a new e-Visit to address those concerns. You will get an e-mail in the next two days asking about your experience.  I hope that your e-visit has been valuable and will speed your recovery. Thank you for using e-visits.  Approximately 5 minutes was used in reviewing the patient's chart, questionnaire, prescribing medications, and documentation.

## 2021-03-27 ENCOUNTER — Telehealth: Payer: Self-pay | Admitting: Physician Assistant

## 2021-03-27 DIAGNOSIS — J329 Chronic sinusitis, unspecified: Secondary | ICD-10-CM

## 2021-03-27 NOTE — Progress Notes (Signed)
Based on what you shared with me, I feel your condition warrants further evaluation and I recommend that you be seen in a face to face office visit. On review of your chart, you have had several e-visits in the past several weeks for similar symptoms with multiple rounds of antibiotics. As such you need an in-person evaluation for examination to determine the most appropriate next step in treatment in case there is something that keeps festering and is not fully resolving, then recurring.    Please reach out to your primary care provider to see if they can assess via video or in office.   NOTE: If you entered your credit card information for this eVisit, you will not be charged. You may see a "hold" on your card for the $35 but that hold will drop off and you will not have a charge processed.   If you are having a true medical emergency please call 911.      For an urgent face to face visit, [place] has six urgent care centers for your convenience:     [facility] [place] [facility] [place] Get Driving Directions [phone] [place], [place] [address] . 8 am - 4 pm Monday - Friday    [facility]) Get Driving Directions [phone] [address] [place], [place] [address] . 8 am to 8 pm Monday-Friday . 10 am to 6 pm [facility]) Get Driving Directions [phone]  [address] [place],  [place]  [address] . 8 am to 8 pm Monday-Friday . 8 am to 4 pm [facility] Get Driving Directions [phone] [place], [place], [place] [address] . 8 am to 8 pm Monday-Friday . 8 am to 4 pm [facility] at [facility] Get Driving Directions  [phone] [address] [place], [place] [address] . 8 am to 8 pm Monday-Friday . 8  am to 4 pm [facility] at [place] Get Driving Directions [phone] [address], [place], [place] [address] . 8 am to 8 pm Monday-Friday . 8 am to 4 pm Saturday-Sunday     Your MyChart E-visit questionnaire answers were reviewed by a board certified advanced clinical practitioner to complete your personal care plan based on your specific symptoms.  Thank you for using e-Visits.

## 2021-04-01 ENCOUNTER — Ambulatory Visit: Payer: Medicaid Other

## 2021-04-07 ENCOUNTER — Telehealth: Payer: Self-pay | Admitting: Nurse Practitioner

## 2021-04-07 ENCOUNTER — Ambulatory Visit: Payer: Self-pay | Admitting: Family Medicine

## 2021-04-07 DIAGNOSIS — R42 Dizziness and giddiness: Secondary | ICD-10-CM

## 2021-04-07 MED ORDER — MECLIZINE HCL 25 MG PO CHEW
25.0000 mg | CHEWABLE_TABLET | Freq: Three times a day (TID) | ORAL | 0 refills | Status: AC | PRN
Start: 2021-04-07 — End: 2021-04-09

## 2021-04-07 NOTE — Progress Notes (Signed)
E Visit for Motion Sickness  We are sorry that you are not feeling well. Here is how we plan to help!  Based on what you have shared with me it looks like you have symptoms of motion sickness. Because you have had this in the past, we are happy to prescribe you the medication that has worked in the past. If these symptoms persist it will be a good idea to follow up with primary care. Reviewing your chart it seems you have had several illnesses recently treated via telehealth. We are always happy to help as much as we can, but when there is recurrent illness we want to be sure you are getting assessed more in person to assure your vital signs (blood pressure/heart rate) are stable as well.   I have prescribed a medication that will help prevent or alleviate your symptoms:  Meclizine 25mg  by mouth three times per day as needed for nausea/motion sickness   Prevention:  You might feel better if you keep your eyes focused on outside while you are in motion. For example, if you are in a car, sit in the front and look in the direction you are moving; if you are on a boat, stay on the deck and look to the horizon. This helps make what you see match the movement you are feeling, and so you are less likely to feel sick.  You should also avoid reading, watching a movie, texting or reading messages, or looking at things close to you inside the vehicle you are riding in.  . Use the seat head rest. Lean your head against the back of the seat or head rest when traveling in vehicles with seats to minimize head movements.  . On a ship: When making your reservations, choose a cabin in the middle of the ship and near the waterline. When on board, go up on deck and focus on the horizon.  . In an airplane: Request a window seat and look out the window. A seat over the front edge of the wing is the most preferable spot (the degree of motion is the lowest here). Direct the air vent to blow cool air on your  face.  . On a train: Always face forward and sit near a window.  . In a vehicle: Sit in the front seat; if you are the passenger, look at the scenery in the distance. For some people, driving the vehicle (rather than being a passenger) is an instant remedy.  . Avoid others who have become nauseous with motion sickness. Seeing and smelling others who have motion sickness may cause you to become sick.  GET HELP RIGHT AWAY IF:   Your symptoms do not improve or worsen within 2 days after treatment.   You cannot keep down fluids after trying the medication.   Other associated symptoms such as severe headache, visual field changes, fever, or intractable nausea and vomiting.  MAKE SURE YOU:   Understand these instructions.  Will watch your condition.  Will get help right away if you are not doing well or get worse.  Thank you for choosing an e-visit.  Your e-visit answers were reviewed by a board certified advanced clinical practitioner to complete your personal care plan. Depending upon the condition, your plan could have included both over the counter or prescription medications.  Please review your pharmacy choice. Be sure that the pharmacy you have chosen is open so that you can pick up your prescription now.  If there  is a problem you may message your provider in [employer] to have the prescription routed to another pharmacy.  Your safety is important to [place]. If you have drug allergies check your prescription carefully.   For the next 24 hours, you can use MyChart to ask questions about today's visit, request a non-urgent call back, or ask for a work or school excuse from your e-visit provider.  You will get an e-mail in the next two days asking about your experience. I hope that your e-visit has been valuable and will speed your recovery.  I spent 10 minutes reviewing this patient's chart history and planning for their care.   Meds ordered this encounter  Medications  .  Meclizine HCl 25 MG CHEW    Sig: Chew 1 tablet (25 mg total) by mouth 3 (three) times daily as needed for up to 2 days (dizziness, nausea).    Dispense:  10 tablet    Refill:  0   References or for more information: [email or web address].com.ee https://my.ResearchRoots.be https://www.uptodate.com

## 2021-04-07 NOTE — Progress Notes (Deleted)
Established patient visit   Patient: Kelly Matthews   DOB: August 10, 1966   55 y.o. Female  MRN: [id number] Visit Date: 04/07/2021  Today's healthcare provider: [name], PA-C   No chief complaint on file.  Subjective    HPI  Diabetes Mellitus Type II, Follow-up  Lab Results  Component Value Date   HGBA1C 6.3 (H) 01/07/2021   HGBA1C 5.9 09/03/2017   HGBA1C 6.1 05/25/2017   Wt Readings from Last 3 Encounters:  01/07/21 218 lb 14.4 oz (99.3 kg)  09/25/20 217 lb 3.2 oz (98.5 kg)  08/22/20 213 lb (96.6 kg)   Last seen for diabetes 3 months ago.  Management since then includes none. She reports {excellent/good/fair/poor:19665} compliance with treatment. She {is/is not:21021397} having side effects. {document side effects if present:1} Symptoms: {Yes/No:20286} fatigue {Yes/No:20286} foot ulcerations  {Yes/No:20286} appetite changes {Yes/No:20286} nausea  {Yes/No:20286} paresthesia of the feet  {Yes/No:20286} polydipsia  {Yes/No:20286} polyuria {Yes/No:20286} visual disturbances   {Yes/No:20286} vomiting     Home blood sugar records: {diabetes glucometry results:16657}  Episodes of hypoglycemia? {Yes/No:20286} {enter symptoms and frequency of symptoms if yes:1}   Current insulin regiment: {enter 'none' or type of insulin and number of units taken with each dose of each insulin formulation that the patient is taking:1} Most Recent Eye Exam: *** {Current exercise:16438:::1} {Current diet habits:16563:::1}  Pertinent Labs: Lab Results  Component Value Date   CHOL 163 01/07/2021   HDL 56 01/07/2021   LDLCALC 96 01/07/2021   TRIG 53 01/07/2021   CHOLHDL 2.9 01/07/2021   Lab Results  Component Value Date   NA 139 01/07/2021   K 4.3 01/07/2021   CREATININE 0.57 01/07/2021   GFRNONAA 106 01/07/2021   GFRAA 122 01/07/2021   GLUCOSE 81 01/07/2021      ---------------------------------------------------------------------------------------------------   {Show patient history (optional):23778::" "}   Medications: Outpatient Medications Prior to Visit  Medication Sig  . albuterol (VENTOLIN HFA) 108 (90 Base) MCG/ACT inhaler Inhale 2 puffs into the lungs every 4 (four) hours as needed for wheezing or shortness of breath.  . benzonatate (TESSALON) 100 MG capsule Take 1 capsule (100 mg total) by mouth 2 (two) times daily as needed for cough.  . clobetasol (TEMOVATE) 0.05 % external solution APPLY TWICE DAILY TO SCALP FOR ITCH  . Clobetasol Propionate 0.05 % shampoo SHAMPOO DAILY. LEAVE IN PLACE FOR 30 MINUTES AND RINSE  . cyclobenzaprine (FLEXERIL) 5 MG tablet TAKE 1 TABLET BY MOUTH THREE TIMES A DAY AS NEEDED FOR MUSCLE SPASMS  . Dapagliflozin-Metformin HCl ER 03-999 MG TB24 Take by mouth.  . diltiazem (DILACOR XR) 120 MG 24 hr capsule Take 120 mg by mouth every morning.   . diltiazem (TIAZAC) 120 MG 24 hr capsule Take 1 capsule by mouth 1 day or 1 dose.  Marland Kitchen doxycycline (VIBRA-TABS) 100 MG tablet Take 1 tablet (100 mg total) by mouth 2 (two) times daily.  . ergocalciferol (VITAMIN D2) 1.25 MG (50000 UT) capsule Take 1 capsule by mouth once a week. (Patient not taking: Reported on 01/07/2021)  . fluocinonide (LIDEX) 0.05 % external solution daily.  . fluticasone (FLONASE) 50 MCG/ACT nasal spray SPRAY 2 SPRAYS IN EACH NOSTRIL DAILY  . glucose blood (ACCU-CHEK ACTIVE STRIPS) test strip Use as instructed  . Lancets (ACCU-CHEK SOFT TOUCH) lancets Use as instructed  . levofloxacin (LEVAQUIN) 500 MG tablet Take 1 tablet (500 mg total) by mouth daily.  . meclizine (ANTIVERT) 25 MG tablet Take 1 tablet (25 mg total) by mouth  3 (three) times daily as needed for dizziness.  . meloxicam (MOBIC) 15 MG tablet Take 1 tablet (15 mg total) by mouth daily.  . montelukast (SINGULAIR) 10 MG tablet TAKE 1 TABLET BY MOUTH EVERY DAY  . PARoxetine (PAXIL) 10 MG  tablet TAKE 1 TABLET BY MOUTH EVERYDAY AT BEDTIME  . promethazine (PHENERGAN) 12.5 MG tablet Take 1 tablet (12.5 mg total) by mouth every 8 (eight) hours as needed for nausea or vomiting. (Patient not taking: Reported on 01/07/2021)  . telmisartan (MICARDIS) 40 MG tablet Take 40 mg by mouth daily.  . traZODone (DESYREL) 50 MG tablet TAKE 1/2 TO 1 TABLET BY MOUTH AT BEDTIME AS NEEDED FOR SLEEP   No facility-administered medications prior to visit.    Review of Systems  {Labs  Heme  Chem  Endocrine  Serology  Results Review (optional):23779::" "}   Objective    There were no vitals taken for this visit. {Show previous vital signs (optional):23777::" "}   Physical Exam  ***  No results found for any visits on 04/07/21.  Assessment & Plan     ***  No follow-ups on file.      {provider attestation***:1}   [name], Hershal Coria  [facility] [phone] (phone) [phone] (fax)  [place]

## 2021-05-05 ENCOUNTER — Other Ambulatory Visit: Payer: Self-pay | Admitting: Family Medicine

## 2021-05-05 DIAGNOSIS — G4700 Insomnia, unspecified: Secondary | ICD-10-CM

## 2021-05-05 DIAGNOSIS — G8929 Other chronic pain: Secondary | ICD-10-CM

## 2021-05-05 NOTE — Telephone Encounter (Signed)
No future visit scheduled

## 2021-06-10 ENCOUNTER — Other Ambulatory Visit: Payer: Self-pay | Admitting: Family Medicine

## 2021-06-10 DIAGNOSIS — G8929 Other chronic pain: Secondary | ICD-10-CM

## 2021-06-10 DIAGNOSIS — M5442 Lumbago with sciatica, left side: Secondary | ICD-10-CM

## 2021-06-20 ENCOUNTER — Telehealth: Payer: Self-pay | Admitting: Physician Assistant

## 2021-06-20 DIAGNOSIS — J029 Acute pharyngitis, unspecified: Secondary | ICD-10-CM

## 2021-06-20 MED ORDER — LIDOCAINE VISCOUS HCL 2 % MT SOLN: 15.0000 mL | OROMUCOSAL | 0 refills | Status: DC | PRN

## 2021-06-20 NOTE — Progress Notes (Signed)
E-Visit for Sore Throat  We are sorry that you are not feeling well.  Here is how we plan to help!  Your symptoms indicate a likely viral infection (Pharyngitis).   Pharyngitis is inflammation in the back of the throat which can cause a sore throat, scratchiness and sometimes difficulty swallowing.   Pharyngitis is typically caused by a respiratory virus and will just run its course.  Please keep in mind that your symptoms could last up to 10 days.  For throat pain, we recommend over the counter oral pain relief medications such as acetaminophen or aspirin, or anti-inflammatory medications such as ibuprofen or naproxen sodium.  Topical treatments such as oral throat lozenges or sprays may be used as needed.  If you have a sore or scratchy throat, use a saltwater gargle-  to  teaspoon of salt dissolved in a 4-ounce to 8-ounce glass of warm water.  Gargle the solution for approximately 15-30 seconds and then spit.  It is important not to swallow the solution.  You can also use throat lozenges/cough drops and Chloraseptic spray to help with throat pain or discomfort.  Warm or cold liquids can also be helpful in relieving throat pain.  Avoid close contact with loved ones, especially the very young and elderly.  Remember to wash your hands thoroughly throughout the day as this is the number one way to prevent the spread of infection and wipe down door knobs and counters with disinfectant.  I can prescribe viscous lidocaine 2% for you to have to numb the throat pain.  After careful review of your answers, I would not recommend and antibiotic for your condition.  Antibiotics should not be used to treat conditions that we suspect are caused by viruses like the virus that causes the common cold or flu. However, some people can have Strep with atypical symptoms. You may need formal testing in clinic or office to confirm if your symptoms continue or worsen.  Providers prescribe antibiotics to treat infections  caused by bacteria. Antibiotics are very powerful in treating bacterial infections when they are used properly.  To maintain their effectiveness, they should be used only when necessary.  Overuse of antibiotics has resulted in the development of super bugs that are resistant to treatment!    Home Care: Only take medications as instructed by your medical team. Do not drink alcohol while taking these medications. A steam or ultrasonic humidifier can help congestion.  You can place a towel over your head and breathe in the steam from hot water coming from a faucet. Avoid close contacts especially the very young and the elderly. Cover your mouth when you cough or sneeze. Always remember to wash your hands.  Get Help Right Away If: You develop worsening fever or throat pain. You develop a severe head ache or visual changes. Your symptoms persist after you have completed your treatment plan.  Make sure you Understand these instructions. Will watch your condition. Will get help right away if you are not doing well or get worse.   Thank you for choosing an e-visit.  Your e-visit answers were reviewed by a board certified advanced clinical practitioner to complete your personal care plan. Depending upon the condition, your plan could have included both over the counter or prescription medications.  Please review your pharmacy choice. Make sure the pharmacy is open so you can pick up prescription now. If there is a problem, you may contact your provider through [employer] messaging and have the prescription routed to  another pharmacy.  Your safety is important to [place]. If you have drug allergies check your prescription carefully.   For the next 24 hours you can use MyChart to ask questions about today's visit, request a non-urgent call back, or ask for a work or school excuse. You will get an email in the next two days asking about your experience. I hope that your e-visit has been valuable and will  speed your recovery.   I provided 5 minutes of non face-to-face time during this encounter for chart review and documentation.

## 2021-06-24 ENCOUNTER — Telehealth: Payer: Self-pay | Admitting: Physician Assistant

## 2021-06-24 DIAGNOSIS — J069 Acute upper respiratory infection, unspecified: Secondary | ICD-10-CM

## 2021-06-24 MED ORDER — BENZONATATE 100 MG PO CAPS: 100.0000 mg | ORAL_CAPSULE | Freq: Three times a day (TID) | ORAL | 0 refills | Status: DC | PRN

## 2021-06-24 MED ORDER — FLUTICASONE PROPIONATE 50 MCG/ACT NA SUSP: 2.0000 | Freq: Every day | NASAL | 0 refills | Status: DC

## 2021-06-24 NOTE — Progress Notes (Signed)
I have spent 5 minutes in review of e-visit questionnaire, review and updating patient chart, medical decision making and response to patient.   Newell Frater Cody Ethne Jeon, PA-C    

## 2021-06-24 NOTE — Progress Notes (Signed)

## 2021-07-07 ENCOUNTER — Ambulatory Visit (INDEPENDENT_AMBULATORY_CARE_PROVIDER_SITE_OTHER): Payer: 59 | Admitting: Obstetrics and Gynecology

## 2021-07-07 ENCOUNTER — Other Ambulatory Visit: Payer: Self-pay

## 2021-07-07 ENCOUNTER — Encounter: Payer: Self-pay | Admitting: Obstetrics and Gynecology

## 2021-07-07 VITALS — BP 126/84 | Ht 60.0 in | Wt 218.0 lb

## 2021-07-07 DIAGNOSIS — Z30432 Encounter for removal of intrauterine contraceptive device: Secondary | ICD-10-CM | POA: Diagnosis not present

## 2021-07-07 DIAGNOSIS — N9089 Other specified noninflammatory disorders of vulva and perineum: Secondary | ICD-10-CM | POA: Diagnosis not present

## 2021-07-07 NOTE — Progress Notes (Signed)
IUD Removal  Patient identified, informed consent performed, consent signed.  Patient was in the dorsal lithotomy position, normal external genitalia was noted.  A speculum was placed in the patient's vagina, normal discharge was noted, no lesions. The cervix was visualized, no lesions, no abnormal discharge.  The strings of the IUD were grasped and pulled using ring forceps. The IUD was removed in its entirety. Patient tolerated the procedure well.    On her left labium majus there is some erythematous skin with scales. The area is somewhat lichenified with mild excoriations.   Patient will use nothing for contraception given her menopausal status. Routine preventative health maintenance measures emphasized.  Advised to try Vaseline 3 x per day for a couple of weeks on her labial area where she appears to have an eczematous area. If this does not improve, she should return to clinic for further evaluation.   Prentice Docker, MD, [facility], [facility] 07/07/2021 3:20 PM

## 2021-07-16 ENCOUNTER — Telehealth: Payer: 59 | Admitting: Nurse Practitioner

## 2021-07-16 DIAGNOSIS — J069 Acute upper respiratory infection, unspecified: Secondary | ICD-10-CM | POA: Diagnosis not present

## 2021-07-16 MED ORDER — BENZONATATE 100 MG PO CAPS: 100.0000 mg | ORAL_CAPSULE | Freq: Three times a day (TID) | ORAL | 0 refills | Status: DC | PRN

## 2021-07-16 NOTE — Progress Notes (Signed)
.E-Visit for Upper Respiratory Infection   We are sorry you are not feeling well.  Here is how we plan to help!   I truly feel that you should have covid testing toi make sure yo do not have covid. Today is the last day that you may be prescribed an antiviral for covid treatment. You have to start an antiviral within 5 days of symptom onset.  Based on what you have shared with me, it looks like you may have a viral upper respiratory infection.  Upper respiratory infections are caused by a large number of viruses; however, rhinovirus is the most common cause.   Symptoms vary from person to person, with common symptoms including sore throat, cough, fatigue or lack of energy and feeling of general discomfort.  A low-grade fever of up to 100.4 may present, but is often uncommon.  Symptoms vary however, and are closely related to a person's age or underlying illnesses.  The most common symptoms associated with an upper respiratory infection are nasal discharge or congestion, cough, sneezing, headache and pressure in the ears and face.  These symptoms usually persist for about 3 to 10 days, but can last up to 2 weeks.  It is important to know that upper respiratory infections do not cause serious illness or complications in most cases.    Upper respiratory infections can be transmitted from person to person, with the most common method of transmission being a person's hands.  The virus is able to live on the skin and can infect other persons for up to 2 hours after direct contact.  Also, these can be transmitted when someone coughs or sneezes; thus, it is important to cover the mouth to reduce this risk.  To keep the spread of the illness at [place], good hand hygiene is very important.  This is an infection that is most likely caused by a virus. There are no specific treatments other than to help you with the symptoms until the infection runs its course.  We are sorry you are not feeling well.  Here is how we  plan to help!   For nasal congestion, you may use an oral decongestants such as Mucinex D or if you have glaucoma or high blood pressure use plain Mucinex.  Saline nasal spray or nasal drops can help and can safely be used as often as needed for congestion.  For your congestion, please use Fluticasone nasal spray one spray in each nostril twice a day previously prescribed.  If you do not have a history of heart disease, hypertension, diabetes or thyroid disease, prostate/bladder issues or glaucoma, you may also use Sudafed to treat nasal congestion.  It is highly recommended that you consult with a pharmacist or your primary care physician to ensure this medication is safe for you to take.     If you have a cough, you may use cough suppressants such as Delsym and Robitussin.  If you have glaucoma or high blood pressure, you can also use Coricidin HBP.   For cough I have prescribed for you A prescription cough medication called Tessalon Perles 100 mg. You may take 1-2 capsules every 8 hours as needed for cough-   If you have a sore or scratchy throat, use a saltwater gargle-  to  teaspoon of salt dissolved in a 4-ounce to 8-ounce glass of warm water.  Gargle the solution for approximately 15-30 seconds and then spit.  It is important not to swallow the solution.  You can  also use throat lozenges/cough drops and Chloraseptic spray to help with throat pain or discomfort.  Warm or cold liquids can also be helpful in relieving throat pain.  For headache, pain or general discomfort, you can use Ibuprofen or Tylenol as directed.   Some authorities believe that zinc sprays or the use of Echinacea may shorten the course of your symptoms.   HOME CARE Only take medications as instructed by your medical team. Be sure to drink plenty of fluids. Water is fine as well as fruit juices, sodas and electrolyte beverages. You may want to stay away from caffeine or alcohol. If you are nauseated, try taking small sips  of liquids. How do you know if you are getting enough fluid? Your urine should be a pale yellow or almost colorless. Get rest. Taking a steamy shower or using a humidifier may help nasal congestion and ease sore throat pain. You can place a towel over your head and breathe in the steam from hot water coming from a faucet. Using a saline nasal spray works much the same way. Cough drops, hard candies and sore throat lozenges may ease your cough. Avoid close contacts especially the very young and the elderly Cover your mouth if you cough or sneeze Always remember to wash your hands.   GET HELP RIGHT AWAY IF: You develop worsening fever. If your symptoms do not improve within 10 days You develop yellow or green discharge from your nose over 3 days. You have coughing fits You develop a severe head ache or visual changes. You develop shortness of breath, difficulty breathing or start having chest pain Your symptoms persist after you have completed your treatment plan  MAKE SURE YOU  Understand these instructions. Will watch your condition. Will get help right away if you are not doing well or get worse.  Thank you for choosing an e-visit.  Your e-visit answers were reviewed by a board certified advanced clinical practitioner to complete your personal care plan. Depending upon the condition, your plan could have included both over the counter or prescription medications.  Please review your pharmacy choice. Make sure the pharmacy is open so you can pick up prescription now. If there is a problem, you may contact your provider through [employer] and have the prescription routed to another pharmacy.  Your safety is important to [place]. If you have drug allergies check your prescription carefully.   For the next 24 hours you can use MyChart to ask questions about today's visit, request a non-urgent call back, or ask for a work or school excuse. You will get an email in the next two days asking  about your experience. I hope that your e-visit has been valuable and will speed your recovery.   5-10 minutes spent reviewing and documenting in chart.

## 2021-07-18 ENCOUNTER — Ambulatory Visit: Payer: Self-pay | Admitting: *Deleted

## 2021-07-18 ENCOUNTER — Other Ambulatory Visit: Payer: Self-pay | Admitting: Family Medicine

## 2021-07-18 DIAGNOSIS — K625 Hemorrhage of anus and rectum: Secondary | ICD-10-CM

## 2021-07-18 NOTE — Telephone Encounter (Signed)
Requesting GI referral to [facility] for rectal bleed for one year now. Colonoscopy/Endoscopy done by previous GI but not resolve and bleeding 1-2 times weekly since. Approx 1 TBLS of bright red blood on top of water and sometimes without a BM. Denies ALL of symptoms. No clots noticed. No difficulty voiding/no abdominal pain, N/V, CP/SOB/dizziness. Very minimal tylenol, occasional alcohol (every other month) no fever. Scheduled for 08/14/21  Please notify via MyChart if referral placed.    Reason for Disposition  Rectal bleeding is a chronic symptom (recurrent or ongoing AND present > 4 weeks)  Answer Assessment - Initial Assessment Questions 1. APPEARANCE of BLOOD: "What color is it?" "Is it passed separately, on the surface of the stool, or mixed in with the stool?"      Bright red, passed usually on surface of stool and sometimes without a stool 2. AMOUNT: "How much blood was passed?"     Approximatley TBLS most times 3. FREQUENCY: "How many times has blood been passed with the stools?"      On and off for a year. 4. ONSET: "When was the blood first seen in the stools?" (Days or weeks)      For a year now at least once or twice weekly 5. DIARRHEA: "Is there also some diarrhea?" If Yes, ask: "How many diarrhea stools in the past 24 hours?"      no 6. CONSTIPATION: "Do you have constipation?" If Yes, ask: "How bad is it?"     no 7. RECURRENT SYMPTOMS: "Have you had blood in your stools before?" If Yes, ask: "When was the last time?" and "What happened that time?"      Seen by GI with colonoscopy/endoscopy one year ago for this. Nothing resolved 8. BLOOD THINNERS: "Do you take any blood thinners?" (e.g., Coumadin/warfarin, Pradaxa/dabigatran, aspirin)     no 9. OTHER SYMPTOMS: "Do you have any other symptoms?"  (e.g., abdomen pain, vomiting, dizziness, fever)     none 10. PREGNANCY: "Is there any chance you are pregnant?" "When was your last menstrual period?"        na  Protocols used: Rectal Bleeding-A-AH

## 2021-07-18 NOTE — Telephone Encounter (Signed)
Placed order for GI referral at [facility] for rectal bleeding. Last colonoscopy by Dr. Bonna Gains was done on 08-22-20.

## 2021-07-18 NOTE — Telephone Encounter (Signed)
Please advise 

## 2021-07-22 ENCOUNTER — Other Ambulatory Visit: Payer: Self-pay | Admitting: *Deleted

## 2021-07-22 NOTE — Telephone Encounter (Signed)
Referral has already been ordered.

## 2021-08-03 ENCOUNTER — Other Ambulatory Visit: Payer: Self-pay | Admitting: Family Medicine

## 2021-08-03 DIAGNOSIS — G8929 Other chronic pain: Secondary | ICD-10-CM

## 2021-08-03 DIAGNOSIS — M5442 Lumbago with sciatica, left side: Secondary | ICD-10-CM

## 2021-08-03 DIAGNOSIS — G4700 Insomnia, unspecified: Secondary | ICD-10-CM

## 2021-08-11 LAB — MICROALBUMIN / CREATININE URINE RATIO: Microalb Creat Ratio: 38.3

## 2021-08-11 LAB — HEMOGLOBIN A1C: Hemoglobin A1C: 7

## 2021-08-14 ENCOUNTER — Ambulatory Visit: Payer: Medicaid Other | Admitting: Family Medicine

## 2021-08-19 NOTE — Progress Notes (Deleted)
      Established patient visit   Patient: Kelly Matthews   DOB: [birth year]   55 y.o. Female  MRN: [id number] Visit Date: 08/20/2021  Today's healthcare provider: Wilhemena Durie, MD   No chief complaint on file.  Subjective    HPI  ***  {Link to patient history deactivated due to formatting error:1}  Medications: Outpatient Medications Prior to Visit  Medication Sig   albuterol (VENTOLIN HFA) 108 (90 Base) MCG/ACT inhaler Inhale 2 puffs into the lungs every 4 (four) hours as needed for wheezing or shortness of breath.   benzonatate (TESSALON PERLES) 100 MG capsule Take 1 capsule (100 mg total) by mouth 3 (three) times daily as needed.   clobetasol (TEMOVATE) 0.05 % external solution APPLY TWICE DAILY TO SCALP FOR ITCH   Clobetasol Propionate 0.05 % shampoo SHAMPOO DAILY. LEAVE IN PLACE FOR 30 MINUTES AND RINSE   cyclobenzaprine (FLEXERIL) 5 MG tablet TAKE 1 TABLET BY MOUTH THREE TIMES A DAY AS NEEDED FOR MUSCLE SPASMS   Dapagliflozin-Metformin HCl ER 03-999 MG TB24 Take by mouth.   diltiazem (DILACOR XR) 120 MG 24 hr capsule Take 120 mg by mouth every morning.    diltiazem (TIAZAC) 120 MG 24 hr capsule Take 1 capsule by mouth 1 day or 1 dose.   doxycycline (VIBRA-TABS) 100 MG tablet Take 1 tablet (100 mg total) by mouth 2 (two) times daily.   ergocalciferol (VITAMIN D2) 1.25 MG (50000 UT) capsule Take 1 capsule by mouth once a week. (Patient not taking: Reported on 01/07/2021)   fluocinonide (LIDEX) 0.05 % external solution daily.   fluticasone (FLONASE) 50 MCG/ACT nasal spray Place 2 sprays into both nostrils daily.   glucose blood (ACCU-CHEK ACTIVE STRIPS) test strip Use as instructed   Lancets (ACCU-CHEK SOFT TOUCH) lancets Use as instructed   levofloxacin (LEVAQUIN) 500 MG tablet Take 1 tablet (500 mg total) by mouth daily.   lidocaine (XYLOCAINE) 2 % solution Use as directed 15 mLs in the mouth or throat every 4 (four) hours as needed for mouth pain. Swish and  gargle in mouth for 15-30 seconds then spit out   meloxicam (MOBIC) 15 MG tablet TAKE 1 TABLET (15 MG TOTAL) BY MOUTH DAILY.   montelukast (SINGULAIR) 10 MG tablet TAKE 1 TABLET BY MOUTH EVERY DAY   PARoxetine (PAXIL) 10 MG tablet TAKE 1 TABLET BY MOUTH EVERYDAY AT BEDTIME   promethazine (PHENERGAN) 12.5 MG tablet Take 1 tablet (12.5 mg total) by mouth every 8 (eight) hours as needed for nausea or vomiting. (Patient not taking: Reported on 01/07/2021)   telmisartan (MICARDIS) 40 MG tablet Take 40 mg by mouth daily.   traZODone (DESYREL) 50 MG tablet TAKE 1/2 TO 1 TABLET BY MOUTH AT BEDTIME AS NEEDED FOR SLEEP   No facility-administered medications prior to visit.    Review of Systems  Gastrointestinal:  Positive for anal bleeding.  All other systems reviewed and are negative.  {Labs  Heme  Chem  Endocrine  Serology  Results Review (optional):23779}   Objective    There were no vitals taken for this visit. {Show previous vital signs (optional):23777}  Physical Exam  ***  No results found for any visits on 08/20/21.  Assessment & Plan     ***  No follow-ups on file.      {provider attestation***:1}   Wilhemena Durie, MD  [facility] [phone] (phone) [phone] (fax)  [place]

## 2021-08-20 ENCOUNTER — Ambulatory Visit: Payer: Medicaid Other | Admitting: Family Medicine

## 2021-08-25 ENCOUNTER — Encounter: Payer: Self-pay | Admitting: Family Medicine

## 2021-08-25 ENCOUNTER — Ambulatory Visit: Payer: 59 | Admitting: Family Medicine

## 2021-08-25 ENCOUNTER — Other Ambulatory Visit: Payer: Self-pay

## 2021-08-25 VITALS — BP 125/64 | HR 95 | Temp 98.5°F | Resp 18 | Wt 218.0 lb

## 2021-08-25 DIAGNOSIS — G4700 Insomnia, unspecified: Secondary | ICD-10-CM | POA: Diagnosis not present

## 2021-08-25 DIAGNOSIS — S8001XA Contusion of right knee, initial encounter: Secondary | ICD-10-CM | POA: Diagnosis not present

## 2021-08-25 DIAGNOSIS — K625 Hemorrhage of anus and rectum: Secondary | ICD-10-CM | POA: Diagnosis not present

## 2021-08-25 DIAGNOSIS — M25541 Pain in joints of right hand: Secondary | ICD-10-CM

## 2021-08-25 DIAGNOSIS — F419 Anxiety disorder, unspecified: Secondary | ICD-10-CM | POA: Diagnosis not present

## 2021-08-25 DIAGNOSIS — M25542 Pain in joints of left hand: Secondary | ICD-10-CM

## 2021-08-25 MED ORDER — TRAZODONE HCL 50 MG PO TABS: 100.0000 mg | ORAL_TABLET | Freq: Every evening | ORAL | Status: DC | PRN

## 2021-08-25 MED ORDER — PAROXETINE HCL 10 MG PO TABS: 10.0000 mg | ORAL_TABLET | Freq: Every day | ORAL | 0 refills | Status: DC

## 2021-08-25 NOTE — Addendum Note (Signed)
Addended by: Birdie Sons on: 08/25/2021 09:18 AM   Modules accepted: Orders

## 2021-08-25 NOTE — Progress Notes (Signed)
Established patient visit   Patient: Kelly Matthews   DOB: 11-27-1965   55 y.o. Female  MRN: [id number] Visit Date: 08/25/2021  Today's healthcare provider: Lelon Huh, MD   Chief Complaint  Patient presents with   Rectal Bleeding   Subjective    Rectal Bleeding  Episode onset: 1 year ago. The problem occurs occasionally. The stool is described as soft. Pertinent negatives include no fever, no abdominal pain, no nausea, no vomiting and no chest pain.   Patient states that she notices bright red blood in her stool most of the time when she has a bowel movement. No pain or masses. Started before her last Colonoscopy 08/22/2020- showed on colon polyp (tubular adenoma). Patient would like a referral to [facility] for a second opinion.   She also reports increasing pain in the joints of her hands and arms for the last few year. Takes meloxicam every day but is not longer effective. She would like to see a specialist for evaluation.   She also reports she started having persistent pain and tenderness over right inferomedial kneecap after being on her knees for prolonged period about two weeks ago. Was not having knee pain prior to that incident.   She also complains of having more stress and anxiety for several months. She previously took paroxetine for hot flashes, but not taken anything for anxiety before. She states she tolerating paroxetine well, but has not taken it for about a year.   She also reports she has been having more trouble sleeping lately. She has been taking 50mg  trazodone nightly for a few years which was initially effective, but doesn't seem to be helping anymore. She doesn't feel like it is related to anxiety or pain.     Medications: Outpatient Medications Prior to Visit  Medication Sig   benzonatate (TESSALON PERLES) 100 MG capsule Take 1 capsule (100 mg total) by mouth 3 (three) times daily as needed.   cyclobenzaprine (FLEXERIL) 5 MG tablet TAKE 1  TABLET BY MOUTH THREE TIMES A DAY AS NEEDED FOR MUSCLE SPASMS   Dapagliflozin-Metformin HCl ER 03-999 MG TB24 Take by mouth.   diltiazem (DILACOR XR) 120 MG 24 hr capsule Take 120 mg by mouth every morning.    diltiazem (TIAZAC) 120 MG 24 hr capsule Take 1 capsule by mouth 1 day or 1 dose.   fluticasone (FLONASE) 50 MCG/ACT nasal spray Place 2 sprays into both nostrils daily.   glucose blood (ACCU-CHEK ACTIVE STRIPS) test strip Use as instructed   Lancets (ACCU-CHEK SOFT TOUCH) lancets Use as instructed   meloxicam (MOBIC) 15 MG tablet TAKE 1 TABLET (15 MG TOTAL) BY MOUTH DAILY.   montelukast (SINGULAIR) 10 MG tablet TAKE 1 TABLET BY MOUTH EVERY DAY   telmisartan (MICARDIS) 40 MG tablet Take 40 mg by mouth daily.   traZODone (DESYREL) 50 MG tablet TAKE 1/2 TO 1 TABLET BY MOUTH AT BEDTIME AS NEEDED FOR SLEEP   lidocaine (XYLOCAINE) 2 % solution Use as directed 15 mLs in the mouth or throat every 4 (four) hours as needed for mouth pain. Swish and gargle in mouth for 15-30 seconds then spit out   PARoxetine (PAXIL) 10 MG tablet TAKE 1 TABLET BY MOUTH EVERYDAY AT BEDTIME (Patient not taking: Reported on 08/25/2021)   [DISCONTINUED] albuterol (VENTOLIN HFA) 108 (90 Base) MCG/ACT inhaler Inhale 2 puffs into the lungs every 4 (four) hours as needed for wheezing or shortness of breath.   [DISCONTINUED] clobetasol (TEMOVATE) 0.05 %  external solution APPLY TWICE DAILY TO SCALP FOR ITCH   [DISCONTINUED] Clobetasol Propionate 0.05 % shampoo SHAMPOO DAILY. LEAVE IN PLACE FOR 30 MINUTES AND RINSE (Patient not taking: Reported on 08/25/2021)   [DISCONTINUED] doxycycline (VIBRA-TABS) 100 MG tablet Take 1 tablet (100 mg total) by mouth 2 (two) times daily.   [DISCONTINUED] ergocalciferol (VITAMIN D2) 1.25 MG (50000 UT) capsule Take 1 capsule by mouth once a week. (Patient not taking: Reported on 08/25/2021)   [DISCONTINUED] fluocinonide (LIDEX) 0.05 % external solution daily.   [DISCONTINUED] levofloxacin  (LEVAQUIN) 500 MG tablet Take 1 tablet (500 mg total) by mouth daily.   [DISCONTINUED] promethazine (PHENERGAN) 12.5 MG tablet Take 1 tablet (12.5 mg total) by mouth every 8 (eight) hours as needed for nausea or vomiting. (Patient not taking: Reported on 01/07/2021)   No facility-administered medications prior to visit.    Review of Systems  Constitutional:  Negative for appetite change, chills, fatigue and fever.  Respiratory:  Negative for chest tightness and shortness of breath.   Cardiovascular:  Negative for chest pain and palpitations.  Gastrointestinal:  Positive for anal bleeding, blood in stool and hematochezia. Negative for abdominal pain, nausea and vomiting.  Musculoskeletal:  Positive for arthralgias.  Neurological:  Negative for dizziness and weakness.  Psychiatric/Behavioral:  Positive for sleep disturbance (trouble falling asleep and staying asleep). The patient is nervous/anxious.       Objective    BP 125/64 (BP Location: Left Arm, Patient Position: Sitting, Cuff Size: Large)   Pulse 95   Temp 98.5 F (36.9 C) (Oral)   Resp 18   Wt 218 lb (98.9 kg)   LMP  (Within Years) Comment: 2018  SpO2 100% Comment: room air  BMI 42.58 kg/m  {Show previous vital signs (optional):23777}  Physical Exam  Tender over right infero-medial patella. No tenderness of patella tendon or with knee extension.     Assessment & Plan     1. Bright red rectal bleeding Persistent for at least a year with no clear explanation on colonoscopy 08/2020. She requests second opinion for [facility] in [place].  - Ambulatory referral to Gastroenterology  2. Insomnia, unspecified type Increase  traZODone (DESYREL) 50 MG tablet to take 2 tablets (100 mg total) by mouth at bedtime as needed. for sleep  3. Anxiety Previously tolerated paroxetine well when taken for hot flashes. Will try  PARoxetine (PAXIL) 10 MG tablet; Take 1 tablet (10 mg total) by mouth daily. After 1 week, increase to 2  tablets daily  Dispense: 60 tablet; Refill: 0  Follow up 1 months.   4. Contusion of right knee, initial encounter Initial injury about two weeks ago and taking meloxicam. Recommend OTC Voltaren Gel QID until healed.   5. Arthralgia of both hands  - Ambulatory referral to Rheumatology         The entirety of the information documented in the History of Present Illness, Review of Systems and Physical Exam were personally obtained by me. Portions of this information were initially documented by the CMA and reviewed by me for thoroughness and accuracy.     Lelon Huh, MD  [facility] [phone] (phone) [phone] (fax)  [place]

## 2021-08-29 ENCOUNTER — Other Ambulatory Visit: Payer: Self-pay | Admitting: Family Medicine

## 2021-08-29 DIAGNOSIS — F419 Anxiety disorder, unspecified: Secondary | ICD-10-CM

## 2021-10-05 ENCOUNTER — Other Ambulatory Visit: Payer: Self-pay | Admitting: Family Medicine

## 2021-10-05 DIAGNOSIS — F419 Anxiety disorder, unspecified: Secondary | ICD-10-CM

## 2021-10-05 NOTE — Telephone Encounter (Signed)
Pharmacy requesting 90 day refill on medication. Sending back to office to assess if appropriate.   Requested Prescriptions  Pending Prescriptions Disp Refills   PARoxetine (PAXIL) 10 MG tablet [Pharmacy Med Name: PAROXETINE HCL 10 MG TABLET] 180 tablet 1    Sig: TAKE 1 TABLET (10 MG TOTAL) BY MOUTH DAILY. AFTER 1 WEEK, INCREASE TO 2 TABLETS DAILY     Psychiatry:  Antidepressants - SSRI Passed - 10/05/2021 10:35 AM      Passed - Valid encounter within last 6 months    Recent Outpatient Visits           1 month ago Bright red rectal bleeding   [facility] Birdie Sons, MD   9 months ago Annual physical exam   [facility] Carles Collet M, [place]   10 months ago COVID-19 virus infection   [employer], Vickki Muff, PA-C   1 year ago [place], [place], [place]   1 year ago Rectal bleeding   [facility] Trinna Post, [place]       Future Appointments             In 2 days Caryn Section, Kirstie Peri, MD [facility], [place]

## 2021-10-07 ENCOUNTER — Ambulatory Visit: Payer: 59 | Admitting: Family Medicine

## 2021-10-07 NOTE — Progress Notes (Deleted)
Established patient visit   Patient: Kelly Matthews   DOB: 1966/10/06   55 y.o. Female  MRN: [id number] Visit Date: 10/07/2021  Today's healthcare provider: Lelon Huh, MD   No chief complaint on file.  Subjective    HPI  Anxiety, Follow-up  She was last seen for anxiety 1  month  ago. Changes made at last visit include starting PARoxetine (PAXIL) 10 MG tablet; Take 1 tablet (10 mg total) by mouth daily. After 1 week, increase to 2 tablets daily.   She reports {excellent/good/fair/poor:19665} compliance with treatment. She reports {good/fair/poor:18685} tolerance of treatment. She {is/is not:21021397} having side effects. {document side effects if present:1}  She feels her anxiety is {Desc; severity:60313} and {improved/worse/unchanged:3041574} since last visit.  Symptoms: {Yes/No:20286} chest pain {Yes/No:20286} difficulty concentrating  {Yes/No:20286} dizziness {Yes/No:20286} fatigue  {Yes/No:20286} feelings of losing control {Yes/No:20286} insomnia  {Yes/No:20286} irritable {Yes/No:20286} palpitations  {Yes/No:20286} panic attacks {Yes/No:20286} racing thoughts  {Yes/No:20286} shortness of breath {Yes/No:20286} sweating  {Yes/No:20286} tremors/shakes    GAD-7 Results No flowsheet data found.  PHQ-9 Scores PHQ9 SCORE ONLY 08/25/2021 01/07/2021 09/25/2020  PHQ-9 Total Score 6 1 0    ---------------------------------------------------------------------------------------------------   Follow up for insomnia:  The patient was last seen for this 1  month  ago. Changes made at last visit include increasing  traZODone (DESYREL) 50 MG tablet to take 2 tablets (100 mg total) by mouth at bedtime as needed for sleep.  She reports {excellent/good/fair/poor:19665} compliance with treatment. She feels that condition is {improved/worse/unchanged:3041574}. She {is/is not:21021397} having side effects.  ***  -----------------------------------------------------------------------------------------   {Link to patient history deactivated due to formatting error:1}  Medications: Outpatient Medications Prior to Visit  Medication Sig   benzonatate (TESSALON PERLES) 100 MG capsule Take 1 capsule (100 mg total) by mouth 3 (three) times daily as needed.   cyclobenzaprine (FLEXERIL) 5 MG tablet TAKE 1 TABLET BY MOUTH THREE TIMES A DAY AS NEEDED FOR MUSCLE SPASMS   Dapagliflozin-Metformin HCl ER 03-999 MG TB24 Take by mouth.   diltiazem (DILACOR XR) 120 MG 24 hr capsule Take 120 mg by mouth every morning.    diltiazem (TIAZAC) 120 MG 24 hr capsule Take 1 capsule by mouth 1 day or 1 dose.   fluticasone (FLONASE) 50 MCG/ACT nasal spray Place 2 sprays into both nostrils daily.   glucose blood (ACCU-CHEK ACTIVE STRIPS) test strip Use as instructed   Lancets (ACCU-CHEK SOFT TOUCH) lancets Use as instructed   lidocaine (XYLOCAINE) 2 % solution Use as directed 15 mLs in the mouth or throat every 4 (four) hours as needed for mouth pain. Swish and gargle in mouth for 15-30 seconds then spit out   meloxicam (MOBIC) 15 MG tablet TAKE 1 TABLET (15 MG TOTAL) BY MOUTH DAILY.   montelukast (SINGULAIR) 10 MG tablet TAKE 1 TABLET BY MOUTH EVERY DAY   PARoxetine (PAXIL) 10 MG tablet Take 1 tablet (10 mg total) by mouth 2 (two) times daily.   telmisartan (MICARDIS) 40 MG tablet Take 40 mg by mouth daily.   traZODone (DESYREL) 50 MG tablet Take 2 tablets (100 mg total) by mouth at bedtime as needed. for sleep   No facility-administered medications prior to visit.    Review of Systems  {Labs  Heme  Chem  Endocrine  Serology  Results Review (optional):23779}   Objective    LMP 04/22/2017 Comment: urine pregnancy test negative {Show previous vital signs (optional):23777}  Physical Exam  ***  No results found for any visits on  10/07/21.  Assessment & Plan     ***  No follow-ups on file.       {provider attestation***:1}   Lelon Huh, MD  [facility] [phone] (phone) [phone] (fax)  [place]

## 2021-11-04 ENCOUNTER — Ambulatory Visit: Payer: Self-pay

## 2021-11-04 ENCOUNTER — Ambulatory Visit: Payer: Self-pay | Admitting: *Deleted

## 2021-11-04 MED ORDER — SERTRALINE HCL 25 MG PO TABS: ORAL_TABLET | ORAL | 3 refills | Status: DC

## 2021-11-04 NOTE — Telephone Encounter (Signed)
°  Chief Complaint: Side effect from Paxil Symptoms: Dizziness, feels spaced out Frequency: Started 2 weeks after starting Paxil Pertinent Negatives: Patient denies  Disposition: [] ED /[] Urgent Care (no appt availability in office) / [] Appointment(In office/virtual)/ []  [facility]/ [] Home Care/ [] Refused Recommended Disposition  Additional Notes: Pt. States she started having dizziness and "feeling spaced out after starting Paxil." Asking if medication should be changed. Please advise pt.      Answer Assessment - Initial Assessment Questions 1. NAME of MEDICATION: "What medicine are you calling about?"     Paxil 2. QUESTION: "What is your question?" (e.g., double dose of medicine, side effect)     Side effects 3. PRESCRIBING HCP: "Who prescribed it?" Reason: if prescribed by specialist, call should be referred to that group.     Dr. Caryn Section 4. SYMPTOMS: "Do you have any symptoms?"     Dizziness, feels "spaced out."  5. SEVERITY: If symptoms are present, ask "Are they mild, moderate or severe?"     Moderate 6. PREGNANCY:  "Is there any chance that you are pregnant?" "When was your last menstrual period?"     No  Protocols used: Medication Question Call-A-AH

## 2021-11-04 NOTE — Addendum Note (Signed)
Addended by: Birdie Sons on: 11/04/2021 05:04 PM   Modules accepted: Orders

## 2021-11-04 NOTE — Telephone Encounter (Signed)
°  Chief Complaint: Medication changes Symptoms: "Feel drunk from paxil" Frequency: x 1 month Pertinent Negatives: Patient denies  Disposition: [] ED /[] Urgent Care (no appt availability in office) / [] Appointment(In office/virtual)/ []  [facility]/ [] Home Care/ [] Refused Recommended Disposition  Additional Notes: Please clarify if pt should wean off Paxil before starting Zoloft.  CB# [phone] Reason for Disposition  [1] Caller has NON-URGENT medicine question about med that PCP prescribed AND [2] triager unable to answer question  Answer Assessment - Initial Assessment Questions 1. NAME of MEDICATION: "What medicine are you calling about?"     Paxil 2. QUESTION: "What is your question?" (e.g., double dose of medicine, side effect)     "Should I taper off of it?" 3. PRESCRIBING HCP: "Who prescribed it?" Reason: if prescribed by specialist, call should be referred to that group.     *No Answer* 4. SYMPTOMS: "Do you have any symptoms?"     *No Answer* 5. SEVERITY: If symptoms are present, ask "Are they mild, moderate or severe?"     *No Answer* 6. PREGNANCY:  "Is there any chance that you are pregnant?" "When was your last menstrual period?"     *No Answer*  Protocols used: Medication Question Call-A-AH

## 2021-11-04 NOTE — Telephone Encounter (Signed)
Change to sertraline, [facility] Please schedule follow up with Kelly Matthews or Kelly Matthews in 3-4 weeks.

## 2021-11-04 NOTE — Telephone Encounter (Signed)
[facility] 11/04/2021 PEC please advise pt below and schedule a follow up with Ria Comment or Daneil Dan.  Thanks,   -Mickel Baas

## 2021-11-04 NOTE — Telephone Encounter (Signed)
Pt wanted to speak with a nurse regarding her recent medication change, pt wanted to know if she needs to completely stop taking Paxil and start on zoloft, please see NT from today 11/04/21        Left VM to call back to discuss.Marland Kitchen

## 2021-11-05 ENCOUNTER — Telehealth: Payer: Self-pay

## 2021-11-05 NOTE — Telephone Encounter (Signed)
LMTCB

## 2021-11-07 NOTE — Telephone Encounter (Signed)
Pt advised as directed below.  FYI... pt has been taking paxil once a day for three days.  I also sent pt a my chart message with the instructions.   Thanks,   -Mickel Baas

## 2021-11-11 ENCOUNTER — Other Ambulatory Visit: Payer: Self-pay

## 2021-11-11 ENCOUNTER — Telehealth: Payer: Self-pay | Admitting: Family Medicine

## 2021-11-11 DIAGNOSIS — G4700 Insomnia, unspecified: Secondary | ICD-10-CM

## 2021-11-11 MED ORDER — TRAZODONE HCL 50 MG PO TABS: 100.0000 mg | ORAL_TABLET | Freq: Every evening | ORAL | 1 refills | Status: DC | PRN

## 2021-11-11 NOTE — Telephone Encounter (Signed)
[facility] for the following medications:  traZODone (DESYREL) 50 MG tablet   Please advise.  

## 2021-11-19 ENCOUNTER — Other Ambulatory Visit: Payer: Self-pay | Admitting: Family Medicine

## 2021-11-19 DIAGNOSIS — G4700 Insomnia, unspecified: Secondary | ICD-10-CM

## 2021-11-19 NOTE — Telephone Encounter (Signed)
Requested medications are due for refill today.  no  Requested medications are on the active medications list.  yes  Last refill. 11/11/2021  Future visit scheduled.   yes  Notes to clinic.  Rx was refilled 11/11/2021. Pharmacy needs Dx code.    Requested Prescriptions  Pending Prescriptions Disp Refills   traZODone (DESYREL) 50 MG tablet [Pharmacy Med Name: TRAZODONE 50 MG TABLET] 180 tablet 1    Sig: Take 2 tablets (100 mg total) by mouth at bedtime as needed. for sleep     Psychiatry: Antidepressants - Serotonin Modulator Passed - 11/19/2021  8:31 AM      Passed - Valid encounter within last 6 months    Recent Outpatient Visits           2 months ago Bright red rectal bleeding   [facility] Birdie Sons, MD   10 months ago Annual physical exam   [facility] Carles Collet M, [place]   12 months ago COVID-19 virus infection   [facility], Vickki Muff, [place]   1 year ago [place], Wendee Beavers, [place]   1 year ago Rectal bleeding   [facility] Trinna Post, [place]       Future Appointments             In 1 week Mikey Kirschner, PA-C [employer], [place]

## 2021-11-27 ENCOUNTER — Ambulatory Visit: Payer: 59 | Admitting: Physician Assistant

## 2021-11-27 NOTE — Patient Instructions (Incomplete)

## 2021-11-27 NOTE — Progress Notes (Deleted)
Established patient visit I,Kelly Matthews,acting as a scribe for [employer], PA-C.,have documented all relevant documentation on the behalf of Mikey Kirschner, PA-C,as directed by  Mikey Kirschner, PA-C while in the presence of Mikey Kirschner, PA-C.   Patient: Kelly Matthews   DOB: 10-16-1966   56 y.o. Female  MRN: [id number] Visit Date: 11/27/2021  Today's healthcare provider: Mikey Kirschner, PA-C   No chief complaint on file.  Subjective    HPI  Anxiety, Follow-up  She was last seen for anxiety 3 months ago. (08/25/21) Changes made at last visit include try  PARoxetine (PAXIL) 10 MG tablet; Take 1 tablet (10 mg total) by mouth daily. After 1 week, increase to 2 tablets daily. Patient called about medication on 11/04/21 due to side effects (Dizziness, feels spaced out) and was changed to sertraline with 3-4 week f/u    She reports {excellent/good/fair/poor:19665} compliance with treatment. She reports {good/fair/poor:18685} tolerance of treatment. She {is/is not:21021397} having side effects. {document side effects if present:1}  She feels her anxiety is {Desc; severity:60313} and {improved/worse/unchanged:3041574} since last visit.  Symptoms: {Yes/No:20286} chest pain {Yes/No:20286} difficulty concentrating  {Yes/No:20286} dizziness {Yes/No:20286} fatigue  {Yes/No:20286} feelings of losing control {Yes/No:20286} insomnia  {Yes/No:20286} irritable {Yes/No:20286} palpitations  {Yes/No:20286} panic attacks {Yes/No:20286} racing thoughts  {Yes/No:20286} shortness of breath {Yes/No:20286} sweating  {Yes/No:20286} tremors/shakes    GAD-7 Results No flowsheet data found.  PHQ-9 Scores PHQ9 SCORE ONLY 08/25/2021 01/07/2021 09/25/2020  PHQ-9 Total Score 6 1 0    ---------------------------------------------------------------------------------------------------   Medications: Outpatient Medications Prior to Visit  Medication Sig   benzonatate (TESSALON  PERLES) 100 MG capsule Take 1 capsule (100 mg total) by mouth 3 (three) times daily as needed.   cyclobenzaprine (FLEXERIL) 5 MG tablet TAKE 1 TABLET BY MOUTH THREE TIMES A DAY AS NEEDED FOR MUSCLE SPASMS   Dapagliflozin-Metformin HCl ER 03-999 MG TB24 Take by mouth.   diltiazem (DILACOR XR) 120 MG 24 hr capsule Take 120 mg by mouth every morning.    diltiazem (TIAZAC) 120 MG 24 hr capsule Take 1 capsule by mouth 1 day or 1 dose.   fluticasone (FLONASE) 50 MCG/ACT nasal spray Place 2 sprays into both nostrils daily.   glucose blood (ACCU-CHEK ACTIVE STRIPS) test strip Use as instructed   Lancets (ACCU-CHEK SOFT TOUCH) lancets Use as instructed   lidocaine (XYLOCAINE) 2 % solution Use as directed 15 mLs in the mouth or throat every 4 (four) hours as needed for mouth pain. Swish and gargle in mouth for 15-30 seconds then spit out   meloxicam (MOBIC) 15 MG tablet TAKE 1 TABLET (15 MG TOTAL) BY MOUTH DAILY.   montelukast (SINGULAIR) 10 MG tablet TAKE 1 TABLET BY MOUTH EVERY DAY   sertraline (ZOLOFT) 25 MG tablet Take 1/2 tablet daily for 8 days, then increase to 1 tablet daily.   telmisartan (MICARDIS) 40 MG tablet Take 40 mg by mouth daily.   traZODone (DESYREL) 50 MG tablet Take 1-2 tablets (50-100 mg total) by mouth at bedtime as needed (for insomnia). for sleep   No facility-administered medications prior to visit.    Review of Systems  {Labs   Heme   Chem   Endocrine   Serology   Results Review (optional):23779}   Objective    LMP 04/22/2017 Comment: urine pregnancy test negative {Show previous vital signs (optional):23777}  Physical Exam  ***  No results found for any visits on 11/27/21.  Assessment & Plan     ***  No follow-ups  on file.      {provider attestation***:1}   Mikey Kirschner, PA-C  [facility] [phone] (phone) [phone] (fax)  [place]

## 2021-12-03 ENCOUNTER — Other Ambulatory Visit: Payer: Self-pay | Admitting: Family Medicine

## 2021-12-03 NOTE — Telephone Encounter (Signed)
Requested medication (s) are due for refill today - no  Requested medication (s) are on the active medication list - yes  Future visit scheduled -no  Last refill: 11/04/21 #30 3RF  Notes to clinic: Request RF: overdue follow up, should have RF  Requested Prescriptions  Pending Prescriptions Disp Refills   sertraline (ZOLOFT) 25 MG tablet [Pharmacy Med Name: SERTRALINE HCL 25 MG TABLET] 90 tablet 2    Sig: Take 1/2 tablet daily for 8 days, then increase to 1 tablet daily.     Psychiatry:  Antidepressants - SSRI Passed - 12/03/2021  8:30 AM      Passed - Valid encounter within last 6 months    Recent Outpatient Visits           3 months ago Bright red rectal bleeding   [facility] Birdie Sons, MD   11 months ago Annual physical exam   [facility] Trinna Post, [place]   1 year ago COVID-19 virus infection   [facility], Vickki Muff, PA-C   1 year ago [place], [place], [place]   1 year ago Rectal bleeding   [place], [place]                 Requested Prescriptions  Pending Prescriptions Disp Refills   sertraline (ZOLOFT) 25 MG tablet [Pharmacy Med Name: SERTRALINE HCL 25 MG TABLET] 90 tablet 2    Sig: Take 1/2 tablet daily for 8 days, then increase to 1 tablet daily.     Psychiatry:  Antidepressants - SSRI Passed - 12/03/2021  8:30 AM      Passed - Valid encounter within last 6 months    Recent Outpatient Visits           3 months ago Bright red rectal bleeding   [facility] Birdie Sons, MD   11 months ago Annual physical exam   [facility] Trinna Post, [place]   1 year ago COVID-19 virus infection   [facility], Vickki Muff, PA-C   1 year ago [place], Wendee Beavers, [place]   1 year ago Rectal bleeding   [place], [place], [place]

## 2021-12-11 ENCOUNTER — Other Ambulatory Visit: Payer: Self-pay

## 2021-12-16 ENCOUNTER — Other Ambulatory Visit: Payer: Self-pay

## 2021-12-16 ENCOUNTER — Telehealth: Payer: Self-pay | Admitting: Family Medicine

## 2021-12-16 DIAGNOSIS — G8929 Other chronic pain: Secondary | ICD-10-CM

## 2021-12-16 MED ORDER — MELOXICAM 15 MG PO TABS: 15.0000 mg | ORAL_TABLET | Freq: Every day | ORAL | 0 refills | Status: DC

## 2021-12-16 NOTE — Telephone Encounter (Signed)
[facility] the following medications:  meloxicam (MOBIC) 15 MG tablet  Please advise.

## 2021-12-26 ENCOUNTER — Telehealth: Payer: 59 | Admitting: Physician Assistant

## 2021-12-26 DIAGNOSIS — J069 Acute upper respiratory infection, unspecified: Secondary | ICD-10-CM

## 2021-12-26 NOTE — Progress Notes (Signed)
Based on what you shared with me, I feel your condition warrants further evaluation and I recommend that you be seen in a face to face visit.  Unfortunately, with having worsening symptoms and having recently been on 3 different antibiotics, it is highly recommended you be evaluated in person. They had mentioned possible referrals to pulmonology and ENT in the last note from Urgent Care. I would recommend to follow up with them again. I do apologize for this inconvenience.   NOTE: There will be NO CHARGE for this eVisit   If you are having a true medical emergency please call 911.      For an urgent face to face visit, [place] has six urgent care centers for your convenience:     [facility] [place] [facility] [place] Get Driving Directions [phone] [place], [place] [address]    [facility] [place] [facility]) Get Driving Directions [phone] [place], [place] [address]  [facility] [place] ([place]) Get Driving Directions [phone] [address] [place],  [place]  [address]  [facility] Get Driving Directions [phone] [address] [place] [place], [place], [place] [address]   [facility] Get Driving Directions  [phone] [address] [place], [place] [address]   [facility] [place] Get Driving Directions [phone] [address], [place], [place] [address]  Your MyChart E-visit questionnaire answers were reviewed by a board certified advanced clinical practitioner to complete your personal care plan based on your specific symptoms.  Thank you for using e-Visits.   I provided 5 minutes of non face-to-face time during this encounter for chart review and documentation.

## 2021-12-27 ENCOUNTER — Emergency Department: Payer: 59

## 2021-12-27 ENCOUNTER — Other Ambulatory Visit: Payer: Self-pay

## 2021-12-27 ENCOUNTER — Emergency Department
Admission: EM | Admit: 2021-12-27 | Discharge: 2021-12-27 | Disposition: A | Discharge status: Home / Self Care | Payer: 59 | Attending: Emergency Medicine | Admitting: Emergency Medicine

## 2021-12-27 DIAGNOSIS — I10 Essential (primary) hypertension: Secondary | ICD-10-CM | POA: Insufficient documentation

## 2021-12-27 DIAGNOSIS — U071 COVID-19: Secondary | ICD-10-CM | POA: Diagnosis not present

## 2021-12-27 DIAGNOSIS — R059 Cough, unspecified: Secondary | ICD-10-CM | POA: Diagnosis present

## 2021-12-27 DIAGNOSIS — E119 Type 2 diabetes mellitus without complications: Secondary | ICD-10-CM | POA: Diagnosis not present

## 2021-12-27 LAB — RESP PANEL BY RT-PCR (FLU A&B, COVID) ARPGX2
Influenza A by PCR: NEGATIVE
Influenza B by PCR: NEGATIVE
SARS Coronavirus 2 by RT PCR: POSITIVE — AB

## 2021-12-27 MED ORDER — IBUPROFEN 600 MG PO TABS: 600.0000 mg | ORAL_TABLET | Freq: Four times a day (QID) | ORAL | 0 refills | Status: DC | PRN

## 2021-12-27 MED ORDER — PSEUDOEPH-BROMPHEN-DM 30-2-10 MG/5ML PO SYRP: 5.0000 mL | ORAL_SOLUTION | Freq: Four times a day (QID) | ORAL | 0 refills | Status: DC | PRN

## 2021-12-27 MED ORDER — NIRMATRELVIR/RITONAVIR (PAXLOVID)TABLET: 3.0000 | ORAL_TABLET | Freq: Two times a day (BID) | ORAL | 0 refills | Status: AC

## 2021-12-27 NOTE — ED Triage Notes (Signed)
Pt comes pov with covid pos test this morning. Has been sick off and on for 3 weeks. Cough, body aches, fever at home. Last dose tylenol 9am. Highest fever 103.

## 2021-12-27 NOTE — Discharge Instructions (Addendum)
Read and follow discharge care instructions.  Take medication as directed.  Your COVID-19 test results will be found later in the MyChart app.

## 2021-12-27 NOTE — ED Provider Notes (Signed)
[facility] Provider Note    Event Date/Time   First MD Initiated Contact with Patient 12/27/21 1205     (approximate)   History   Cough   HPI {Kelly Matthews is a 56 y.o. female patient presents with cough, body aches, and fever.  Patient states she test positive COVID 19 this morning for home kit.  Patient states intermitting viral illness for 3 weeks.  Denies recent travel or known contact with COVID-19.  Patient has taken the vaccine but no boosters.  Past medical history is remarkable for depression, diabetes, and hypertension.     Physical Exam   Triage Vital Signs: ED Triage Vitals  Enc Vitals Group     BP 12/27/21 1201 (!) 145/81     Pulse Rate 12/27/21 1201 89     Resp 12/27/21 1201 16     Temp 12/27/21 1201 98.3 F (36.8 C)     Temp Source 12/27/21 1201 Oral     SpO2 12/27/21 1201 98 %     Weight 12/27/21 1157 210 lb (95.3 kg)     Height 12/27/21 1157 5' (1.524 m)     Head Circumference --      Peak Flow --      Pain Score 12/27/21 1157 8     Pain Loc --      Pain Edu? --      Excl. in [place]? --     Most recent vital signs: Vitals:   12/27/21 1201  BP: (!) 145/81  Pulse: 89  Resp: 16  Temp: 98.3 F (36.8 C)  SpO2: 98%     General: Awake, no distress.  CV:  Good peripheral perfusion.  Resp:  Normal effort.  Abd:  No distention.  Other:  Nonproductive cough.   ED Results / Procedures / Treatments   Labs (all labs ordered are listed, but only abnormal results are displayed) Labs Reviewed  RESP PANEL BY RT-PCR (FLU A&B, COVID) ARPGX2 - Abnormal; Notable for the following components:      Result Value   SARS Coronavirus 2 by RT PCR POSITIVE (*)    All other components within normal limits     EKG     RADIOLOGY No acute findings on chest x-ray. X-rays findings confirmed by radiology report.   PROCEDURES:  Critical Care performed: No  Procedures   MEDICATIONS ORDERED IN ED: Medications - No  data to display   IMPRESSION / MDM / [place] / ED COURSE  I reviewed the triage vital signs and the nursing notes.                              Differential diagnosis includes, but is not limited to, COVID-19, viral illness with cough, or pneumonia.  Discussed no acute findings on chest x-ray with patient.  Patient complaining physical exam consistent with viral illness with cough.  Patient home test was positive for COVID-19.  Hospital COVID-19 results pending.  Patient given discharge care instructions and advised to take prescribed medication as directed.  Patient also given a work note and advised follow-up PCP if no improvement or worsening of complaint.  FINAL CLINICAL IMPRESSION(S) / ED DIAGNOSES   Final diagnoses:  [id number]     Rx / DC Orders   ED Discharge Orders          Ordered    nirmatrelvir/ritonavir EUA (PAXLOVID) 20 x 150 MG & 10 x  100MG TABS  2 times daily        12/27/21 1413    brompheniramine-pseudoephedrine-DM 30-2-10 MG/5ML syrup  4 times daily PRN        12/27/21 1413    ibuprofen (ADVIL) 600 MG tablet  Every 6 hours PRN        12/27/21 1413             Note:  This document was prepared using Dragon voice recognition software and may include unintentional dictation errors.    Sable Feil, PA-C 12/27/21 1422    Blake Divine, MD 12/27/21 1444

## 2022-01-19 NOTE — Progress Notes (Signed)
Established patient visit   Patient: Kelly Matthews   DOB: June 25, 1966   56 y.o. Female  MRN: [id number] Visit Date: 01/20/2022  Today's healthcare provider: Mardene Speak, PA-C   Chief Complaint  Patient presents with   Breast Mass    Left breast lump ... no pain/noticed 2 wks ago  No change in size...moved when pressed on    Psoriasis    "Everywhere" .Mar[place] Kitchen.. x 3 wks flair up / triamcinolone is not helping      Sore Throat   Subjective     Sore Throat  This is a new problem. The current episode started in the past 7 days. The problem has been unchanged. Neither side of throat is experiencing more pain than the other. The maximum temperature recorded prior to her arrival was 100.4 - 100.9 F. The fever has been present for less than 1 day. The pain is moderate to severe. Associated symptoms include congestion, coughing and neck pain. Pertinent negatives include no abdominal pain, diarrhea, drooling, ear discharge, ear pain, headaches, hoarse voice, plugged ear sensation, shortness of breath, stridor, swollen glands, trouble swallowing or vomiting. She has had no exposure to strep or mono. She has tried acetaminophen for the symptoms. The treatment provided mild relief.   Breast lump Duration :weeks Location: left Onset: noticed accidentally Severity: none Redness: no Swelling: no Trauma: no trauma Breastfeeding: no Associated with menstral cycle: no Nipple discharge: no Breast lump: yes Status: stable Treatments attempted: none Previous mammogram: Was negative and screening mammogram in 1 year was recommended - 2/4 /22  Psoriasis Patient had a COVID infection  a month ago.  Psoriasis flaired up afterwards she has been having a very small itchy lesions all over the body.  She used triamcinolone with little relief.  Asks for a referral to dermatology  Medications: Outpatient Medications Prior to Visit  Medication Sig   atorvastatin (LIPITOR) 10 MG tablet Take 10  mg by mouth daily.   benzonatate (TESSALON PERLES) 100 MG capsule Take 1 capsule (100 mg total) by mouth 3 (three) times daily as needed.   Continuous Blood Gluc Receiver (DEXCOM G6 RECEIVER) DEVI USE FOR CONTINUOUS BLOOD GLUCOSE MONITORING   Continuous Blood Gluc Sensor (DEXCOM G6 SENSOR) MISC USE FOR CONTINUOUS BLOOD GLUCOSE MONITORING REPLACE SENSOR EVERY 10 DAYS   Continuous Blood Gluc Transmit (DEXCOM G6 TRANSMITTER) MISC USE FOR CONTINUOUS BLOOD GLUCOSE MONITORING REPLACE TRANSMITTER EVERY 90 DAYS   cyclobenzaprine (FLEXERIL) 5 MG tablet TAKE 1 TABLET BY MOUTH THREE TIMES A DAY AS NEEDED FOR MUSCLE SPASMS   cyclobenzaprine (FLEXERIL) 5 MG tablet Take by mouth.   Dapagliflozin-Metformin HCl ER 03-999 MG TB24 Take by mouth.   diltiazem (TIAZAC) 120 MG 24 hr capsule Take 1 capsule by mouth 1 day or 1 dose.   fluticasone (FLONASE) 50 MCG/ACT nasal spray Place 2 sprays into both nostrils daily.   gabapentin (NEURONTIN) 300 MG capsule Take 300 mg by mouth 3 (three) times daily.   ibuprofen (ADVIL) 600 MG tablet Take 1 tablet (600 mg total) by mouth every 6 (six) hours as needed.   Lancets (ACCU-CHEK SOFT TOUCH) lancets Use as instructed   meloxicam (MOBIC) 15 MG tablet Take 1 tablet (15 mg total) by mouth daily.   meloxicam (MOBIC) 15 MG tablet Take 1 tablet by mouth daily.   montelukast (SINGULAIR) 10 MG tablet TAKE 1 TABLET BY MOUTH EVERY DAY   sertraline (ZOLOFT) 25 MG tablet TAKE 1/2 TABLET DAILY FOR 8 DAYS, THEN  INCREASE TO 1 TABLET DAILY.   traZODone (DESYREL) 50 MG tablet Take 1-2 tablets (50-100 mg total) by mouth at bedtime as needed (for insomnia). for sleep   triamcinolone ointment (KENALOG) 0.1 % PLEASE SEE ATTACHED FOR DETAILED DIRECTIONS   [DISCONTINUED] brompheniramine-pseudoephedrine-DM 30-2-10 MG/5ML syrup Take 5 mLs by mouth 4 (four) times daily as needed.   [DISCONTINUED] diltiazem (DILACOR XR) 120 MG 24 hr capsule Take 120 mg by mouth every morning.    [DISCONTINUED] glucose  blood (ACCU-CHEK ACTIVE STRIPS) test strip Use as instructed   [DISCONTINUED] lidocaine (XYLOCAINE) 2 % solution Use as directed 15 mLs in the mouth or throat every 4 (four) hours as needed for mouth pain. Swish and gargle in mouth for 15-30 seconds then spit out   [DISCONTINUED] telmisartan (MICARDIS) 40 MG tablet Take 40 mg by mouth daily.   No facility-administered medications prior to visit.    Review of Systems  HENT:  Positive for congestion. Negative for drooling, ear discharge, ear pain and trouble swallowing.   Respiratory:  Positive for cough. Negative for shortness of breath and stridor.   Gastrointestinal:  Negative for abdominal pain, diarrhea and vomiting.  Musculoskeletal:  Positive for neck pain.  Neurological:  Negative for headaches.     Objective    BP 133/76 (BP Location: Right Arm, Patient Position: Sitting, Cuff Size: Normal)    Pulse 87    Temp 98.2 F (36.8 C) (Oral)    Resp 16    Wt 212 lb (96.2 kg)    LMP 04/22/2017 Comment: urine pregnancy test negative   SpO2 99%    BMI 41.40 kg/m   Physical Exam Vitals and nursing note reviewed.  Constitutional:      General: She is in acute distress.     Appearance: She is well-developed. She is obese.  HENT:     Head: Normocephalic and atraumatic.     Right Ear: No drainage, swelling or tenderness. A middle ear effusion is present. Tympanic membrane is not erythematous.     Left Ear: No drainage, swelling or tenderness. A middle ear effusion is present. Tympanic membrane is not erythematous.     Nose: Congestion and rhinorrhea present.     Mouth/Throat:     Mouth: No oral lesions.     Pharynx: Uvula midline. Posterior oropharyngeal erythema (mild) present. No pharyngeal swelling, oropharyngeal exudate or uvula swelling.     Tonsils: No tonsillar exudate or tonsillar abscesses.  Neck:     Thyroid: No thyromegaly.  Cardiovascular:     Rate and Rhythm: Normal rate and regular rhythm.  Pulmonary:     Effort: Pulmonary  effort is normal.  Chest:  Breasts:    Right: Normal.     Left: Mass present. No swelling, bleeding, nipple discharge, skin change or tenderness.    Abdominal:     General: Bowel sounds are normal.     Palpations: Abdomen is soft.     Tenderness: There is no abdominal tenderness.  Musculoskeletal:     Cervical back: Normal range of motion and neck supple.  Lymphadenopathy:     Cervical: Cervical adenopathy present.     Upper Body:     Right upper body: No supraclavicular or axillary adenopathy.     Left upper body: No supraclavicular or axillary adenopathy.  Skin:    General: Skin is warm.     Capillary Refill: Capillary refill takes less than 2 seconds.     Findings: Rash present.     Comments:  presence of small, erythematous papules and plaques on the skin over trunk, abdomen, buttocks, extremities   Neurological:     General: No focal deficit present.     Mental Status: She is alert and oriented to person, place, and time.  Psychiatric:        Behavior: Behavior normal.      Assessment & Plan     1. 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 Might consider antibiotics - predniSONE (DELTASONE) 10 MG tablet; Take 1 tablet (10 mg total) by mouth daily with breakfast.  Dispense: 10 tablet; Refill: 0 -Recommended to use emollients generously to moisturize the skin/to avoid Koebner phenomenon - Ambulatory referral to Dermatology  2. Mass overlapping multiple quadrants of left breast A 4.5 x 5 cm mobile mass at the 12 o'clock position of the left breast - Diagnostic mammogram ordered. Might consider [place] - Will reassess at the next visit  3. Sore throat No fever.  Continue symptomatic treatment: Drink a lot of fluids, take over-the-counter pain medications, warm salt gargles, intranasal steroid... - predniSONE (DELTASONE) 10 MG tablet; Take 1 tablet (10 mg total)  by mouth daily with breakfast.  Dispense: 10 tablet; Refill: 0 - We discussed about side effects vs benefits of using steroids in DMII patients. Patient agreed with assessment and treatment plan and expressed her wiliness to proceed with the plan. -We will reassess at the next visit. Might consider antibiotics   The patient was advised to call back or seek an in-person evaluation if the symptoms worsen or if the condition fails to improve as anticipated.  I discussed the assessment and treatment plan with the patient. The patient was provided an opportunity to ask questions and all were answered. The patient agreed with the plan and demonstrated an understanding of the instructions.  The entirety of the information documented in the History of Present Illness, Review of Systems and Physical Exam were personally obtained by me. Portions of this information were initially documented by the CMA and reviewed by me for thoroughness and accuracy.     Mardene Speak, PA-C  [facility] [phone] (phone) [phone] (fax)  [place]

## 2022-01-20 ENCOUNTER — Encounter: Payer: Self-pay | Admitting: Physician Assistant

## 2022-01-20 ENCOUNTER — Ambulatory Visit (INDEPENDENT_AMBULATORY_CARE_PROVIDER_SITE_OTHER): Payer: 59 | Admitting: Physician Assistant

## 2022-01-20 ENCOUNTER — Other Ambulatory Visit: Payer: Self-pay

## 2022-01-20 VITALS — BP 133/76 | HR 87 | Temp 98.2°F | Resp 16 | Wt 212.0 lb

## 2022-01-20 DIAGNOSIS — J029 Acute pharyngitis, unspecified: Secondary | ICD-10-CM

## 2022-01-20 DIAGNOSIS — N6325 Unspecified lump in the left breast, overlapping quadrants: Secondary | ICD-10-CM

## 2022-01-20 DIAGNOSIS — L409 Psoriasis, unspecified: Secondary | ICD-10-CM

## 2022-01-20 MED ORDER — PREDNISONE 10 MG PO TABS: 10.0000 mg | ORAL_TABLET | Freq: Every day | ORAL | 0 refills | Status: DC

## 2022-01-26 ENCOUNTER — Telehealth: Payer: Self-pay

## 2022-01-26 DIAGNOSIS — Z1231 Encounter for screening mammogram for malignant neoplasm of breast: Secondary | ICD-10-CM

## 2022-01-26 DIAGNOSIS — N6320 Unspecified lump in the left breast, unspecified quadrant: Secondary | ICD-10-CM

## 2022-01-26 NOTE — Telephone Encounter (Signed)
A mass is approximately at the 12 o'clock position of the left breast

## 2022-01-26 NOTE — Telephone Encounter (Signed)
Copied from [place] [phone]. Topic: General - Other ?>> Jan 26, 2022  9:14 AM Parke Poisson wrote: ?Reason for CRM: [facility] is requesting orders for bilateral diagnostic mammogram TOMO [id number],Left/Right breast limited breast ultrasounds [id number] and [id number]. They will also need to know breast mass clock position ?

## 2022-01-27 NOTE — Addendum Note (Signed)
Addended by: Minette Headland on: 01/27/2022 09:29 AM ? ? Modules accepted: Orders ? ?

## 2022-02-11 ENCOUNTER — Ambulatory Visit
Admission: RE | Admit: 2022-02-11 | Discharge: 2022-02-11 | Disposition: A | Discharge status: Home / Self Care | Payer: 59 | Source: Ambulatory Visit | Attending: Physician Assistant | Admitting: Physician Assistant

## 2022-02-11 DIAGNOSIS — N6320 Unspecified lump in the left breast, unspecified quadrant: Secondary | ICD-10-CM

## 2022-02-11 DIAGNOSIS — Z1231 Encounter for screening mammogram for malignant neoplasm of breast: Secondary | ICD-10-CM

## 2022-02-24 ENCOUNTER — Ambulatory Visit: Payer: 59 | Admitting: Physician Assistant

## 2022-02-24 NOTE — Progress Notes (Deleted)
?  ? ? ?  Established patient visit ? ? ?Patient: Kelly Matthews   DOB: 08-02-1966   56 y.o. Female  MRN: [id number] ?Visit Date: 02/24/2022 ? ?Today's healthcare provider: Mardene Speak, PA-C  ? ?No chief complaint on file. ? ?Subjective  ?  ?HPI  ?Follow up for breast mass: ? ?The patient was last seen for this on 01/20/2022.   ?Changes made at last visit include ordering mammogram and ultrasound; results were BI-RADS CATEGORY  3: Probably benign. ? ?She reports {excellent/good/fair/poor:19665} compliance with treatment. ?She feels that condition is {improved/worse/unchanged:3041574}. ?She {is/is not:21021397} having side effects. *** ? ?-----------------------------------------------------------------------------------------  ? ?Medications: ?Outpatient Medications Prior to Visit  ?Medication Sig  ? atorvastatin (LIPITOR) 10 MG tablet Take 10 mg by mouth daily.  ? benzonatate (TESSALON PERLES) 100 MG capsule Take 1 capsule (100 mg total) by mouth 3 (three) times daily as needed.  ? Continuous Blood Gluc Receiver (DEXCOM G6 RECEIVER) DEVI USE FOR CONTINUOUS BLOOD GLUCOSE MONITORING  ? Continuous Blood Gluc Sensor (DEXCOM G6 SENSOR) MISC USE FOR CONTINUOUS BLOOD GLUCOSE MONITORING REPLACE SENSOR EVERY 10 DAYS  ? Continuous Blood Gluc Transmit (DEXCOM G6 TRANSMITTER) MISC USE FOR CONTINUOUS BLOOD GLUCOSE MONITORING REPLACE TRANSMITTER EVERY 90 DAYS  ? cyclobenzaprine (FLEXERIL) 5 MG tablet TAKE 1 TABLET BY MOUTH THREE TIMES A DAY AS NEEDED FOR MUSCLE SPASMS  ? cyclobenzaprine (FLEXERIL) 5 MG tablet Take by mouth.  ? Dapagliflozin-Metformin HCl ER 03-999 MG TB24 Take by mouth.  ? diltiazem (TIAZAC) 120 MG 24 hr capsule Take 1 capsule by mouth 1 day or 1 dose.  ? fluticasone (FLONASE) 50 MCG/ACT nasal spray Place 2 sprays into both nostrils daily.  ? gabapentin (NEURONTIN) 300 MG capsule Take 300 mg by mouth 3 (three) times daily.  ? ibuprofen (ADVIL) 600 MG tablet Take 1 tablet (600 mg total) by mouth every 6  (six) hours as needed.  ? Lancets (ACCU-CHEK SOFT TOUCH) lancets Use as instructed  ? meloxicam (MOBIC) 15 MG tablet Take 1 tablet (15 mg total) by mouth daily.  ? meloxicam (MOBIC) 15 MG tablet Take 1 tablet by mouth daily.  ? montelukast (SINGULAIR) 10 MG tablet TAKE 1 TABLET BY MOUTH EVERY DAY  ? predniSONE (DELTASONE) 10 MG tablet Take 1 tablet (10 mg total) by mouth daily with breakfast.  ? sertraline (ZOLOFT) 25 MG tablet TAKE 1/2 TABLET DAILY FOR 8 DAYS, THEN INCREASE TO 1 TABLET DAILY.  ? traZODone (DESYREL) 50 MG tablet Take 1-2 tablets (50-100 mg total) by mouth at bedtime as needed (for insomnia). for sleep  ? triamcinolone ointment (KENALOG) 0.1 % PLEASE SEE ATTACHED FOR DETAILED DIRECTIONS  ? ?No facility-administered medications prior to visit.  ? ? ?Review of Systems ? ?{Labs  Heme  Chem  Endocrine  Serology  Results Review (optional):23779} ?  Objective  ?  ?LMP 04/22/2017 Comment: urine pregnancy test negative ?{Show previous vital signs (optional):23777} ? ?Physical Exam  ?*** ? ?No results found for any visits on 02/24/22. ? Assessment & Plan  ?  ? ?*** ? ?No follow-ups on file.  ?   ? ?{provider attestation***:1} ? ? ?Mardene Speak, PA-C  ?[place] ?[phone] (phone) ?[phone] (fax) ? ?[facility]  ?

## 2022-03-13 ENCOUNTER — Telehealth: Payer: 59 | Admitting: Emergency Medicine

## 2022-03-13 DIAGNOSIS — J069 Acute upper respiratory infection, unspecified: Secondary | ICD-10-CM

## 2022-03-13 NOTE — Progress Notes (Signed)
Based on what you shared with me, I feel your condition warrants further evaluation and I recommend that you be seen in a face to face visit. I'm especially worried about your cough and fever.  ?  ?NOTE: There will be NO CHARGE for this eVisit ?  ?If you are having a true medical emergency please call 911.   ?  ? For an urgent face to face visit, [facility] has six urgent care centers for your convenience:  ?  ? [facility] ?Get Driving Directions ?[phone] ?[address] ?[place], [place] [address] ?  ? [facility]) ?Get Driving Directions ?[phone] ?[address] ?[place], [place] [address] ? ?[facility] ([place]) ?Get Driving Directions ?[address] [place],  [place]  [address] ? ?[facility] ?Get Driving Directions ?[phone] ?[address] ?[place], [place] [address] ?  ?[facility] ?Get Driving Directions  ?[phone] ?[address] ?[address] ?[place], [place] [address] ?  ?[facility] ?Get Driving Directions ?[phone] ?[address] ?[place], [place] [address] ? ?Your MyChart E-visit questionnaire answers were reviewed by a board certified advanced clinical practitioner to complete your personal care plan based on your specific symptoms.  Thank you for using e-Visits. ?   ? ?

## 2022-03-24 ENCOUNTER — Other Ambulatory Visit: Payer: Self-pay | Admitting: Physician Assistant

## 2022-03-24 DIAGNOSIS — G8929 Other chronic pain: Secondary | ICD-10-CM

## 2022-03-25 NOTE — Telephone Encounter (Signed)
Requested medication (s) are due for refill today:   Yes ? ?Requested medication (s) are on the active medication list:   Yes ? ?Future visit scheduled:   No    Seen by Mardene Speak, PA 2 months ago ? ? ?Last ordered: 12/16/2021 #30, 0 refills ? ?Returned because labs are overdue per protocol.  ? ?Requested Prescriptions  ?Pending Prescriptions Disp Refills  ? meloxicam (MOBIC) 15 MG tablet [Pharmacy Med Name: MELOXICAM 15 MG TABLET] 30 tablet 0  ?  Sig: Take 1 tablet (15 mg total) by mouth daily.  ?  ? Analgesics:  COX2 Inhibitors Failed - 03/24/2022 11:05 AM  ?  ?  Failed - Manual Review: Labs are only required if the patient has taken medication for more than 8 weeks.  ?  ?  Failed - HGB in normal range and within 360 days  ?  Hemoglobin  ?Date Value Ref Range Status  ?01/07/2021 14.0 11.1 - 15.9 g/dL Final  ?  ?  ?  ?  Failed - Cr in normal range and within 360 days  ?  Creatinine, Ser  ?Date Value Ref Range Status  ?01/07/2021 0.57 0.57 - 1.00 mg/dL Final  ?  Comment:  ?                 **Effective January 13, 2021 [facility] will begin** ?                 reporting the 2021 CKD-EPI creatinine equation that ?                 estimates kidney function without a race variable. ?  ? ?Creatinine, POC  ?Date Value Ref Range Status  ?08/06/2017 NA mg/dL Final  ?  ?  ?  ?  Failed - HCT in normal range and within 360 days  ?  Hematocrit  ?Date Value Ref Range Status  ?01/07/2021 41.5 34.0 - 46.6 % Final  ?  ?  ?  ?  Failed - AST in normal range and within 360 days  ?  AST  ?Date Value Ref Range Status  ?01/07/2021 55 (H) 0 - 40 IU/L Final  ?  ?  ?  ?  Failed - ALT in normal range and within 360 days  ?  ALT  ?Date Value Ref Range Status  ?01/07/2021 109 (H) 0 - 32 IU/L Final  ?  ?  ?  ?  Failed - eGFR is 30 or above and within 360 days  ?  GFR calc Af Amer  ?Date Value Ref Range Status  ?01/07/2021 122 >59 mL/min/1.73 Final  ?  Comment:  ?  **In accordance with recommendations from the NKF-ASN Task force,** ?  [facility] is  in the process of updating its eGFR calculation to the ?  2021 CKD-EPI creatinine equation that estimates kidney function ?  without a race variable. ?  ? ?GFR calc non Af Amer  ?Date Value Ref Range Status  ?01/07/2021 106 >59 mL/min/1.73 Final  ?  ?  ?  ?  Passed - Patient is not pregnant  ?  ?  Passed - Valid encounter within last 12 months  ?  Recent Outpatient Visits   ? ?      ? 2 months ago Psoriasis  ? [facility] [place], [place], [place]  ? 7 months ago Bright red rectal bleeding  ? [facility] Caryn Section, Kirstie Peri, MD  ? 1 year ago Annual physical exam  ?  [place], [place]  ? 1 year ago COVID-19 virus infection  ? [place], PA-C  ? 1 year ago Dizziness  ? [facility] [place], [place], [place]  ? ?  ?  ? ? ?  ?  ?  ? ?

## 2022-04-23 ENCOUNTER — Other Ambulatory Visit: Payer: Self-pay | Admitting: Physician Assistant

## 2022-04-23 DIAGNOSIS — G8929 Other chronic pain: Secondary | ICD-10-CM

## 2022-04-24 NOTE — Telephone Encounter (Signed)
Requested medication (s) are due for refill today: Due 04/26/22  Requested medication (s) are on the active medication list: yes    Last refill: 03/26/22 #30  0 refills  Future visit scheduled no  Notes to clinic:Failed due to labs, please review. Thank you.  Requested Prescriptions  Pending Prescriptions Disp Refills   meloxicam (MOBIC) 15 MG tablet [Pharmacy Med Name: MELOXICAM 15 MG TABLET] 30 tablet 0    Sig: TAKE 1 TABLET (15 MG TOTAL) BY MOUTH DAILY.     Analgesics:  COX2 Inhibitors Failed - 04/23/2022  1:29 PM      Failed - Manual Review: Labs are only required if the patient has taken medication for more than 8 weeks.      Failed - HGB in normal range and within 360 days    Hemoglobin  Date Value Ref Range Status  01/07/2021 14.0 11.1 - 15.9 g/dL Final         Failed - Cr in normal range and within 360 days    Creatinine, Ser  Date Value Ref Range Status  01/07/2021 0.57 0.57 - 1.00 mg/dL Final    Comment:                   **Effective January 13, 2021 [facility] will begin**                  reporting the 2021 CKD-EPI creatinine equation that                  estimates kidney function without a race variable.    Creatinine, POC  Date Value Ref Range Status  08/06/2017 NA mg/dL Final         Failed - HCT in normal range and within 360 days    Hematocrit  Date Value Ref Range Status  01/07/2021 41.5 34.0 - 46.6 % Final         Failed - AST in normal range and within 360 days    AST  Date Value Ref Range Status  01/07/2021 55 (H) 0 - 40 IU/L Final         Failed - ALT in normal range and within 360 days    ALT  Date Value Ref Range Status  01/07/2021 109 (H) 0 - 32 IU/L Final         Failed - eGFR is 30 or above and within 360 days    GFR calc Af Amer  Date Value Ref Range Status  01/07/2021 122 >59 mL/min/1.73 Final    Comment:    **In accordance with recommendations from the NKF-ASN Task force,**   [facility] is in the process of updating its eGFR  calculation to the   2021 CKD-EPI creatinine equation that estimates kidney function   without a race variable.    GFR calc non Af Amer  Date Value Ref Range Status  01/07/2021 106 >59 mL/min/1.73 Final         Passed - Patient is not pregnant      Passed - Valid encounter within last 12 months    Recent Outpatient Visits           3 months ago Psoriasis   [facility] [place], [place], PA-C   8 months ago Bright red rectal bleeding   [facility] Malva Limes, MD   1 year ago Annual physical exam   [facility] Osvaldo Angst [place], [place]   1 year ago COVID-19 virus infection  [place], [place]   1 year ago [place], [place]

## 2022-04-27 NOTE — Telephone Encounter (Signed)
LOV 01-20-22 NOV none  LF 03-26-22 #30 no refills

## 2022-04-28 ENCOUNTER — Ambulatory Visit (INDEPENDENT_AMBULATORY_CARE_PROVIDER_SITE_OTHER): Payer: 59 | Admitting: Physician Assistant

## 2022-04-28 ENCOUNTER — Encounter: Payer: Self-pay | Admitting: Physician Assistant

## 2022-04-28 VITALS — BP 126/72 | HR 78 | Temp 98.0°F | Resp 14 | Wt 212.0 lb

## 2022-04-28 DIAGNOSIS — J0110 Acute frontal sinusitis, unspecified: Secondary | ICD-10-CM

## 2022-04-28 DIAGNOSIS — M5442 Lumbago with sciatica, left side: Secondary | ICD-10-CM

## 2022-04-28 DIAGNOSIS — G8929 Other chronic pain: Secondary | ICD-10-CM

## 2022-04-28 DIAGNOSIS — M7989 Other specified soft tissue disorders: Secondary | ICD-10-CM | POA: Diagnosis not present

## 2022-04-28 MED ORDER — OMEPRAZOLE 20 MG PO CPDR: 20.0000 mg | DELAYED_RELEASE_CAPSULE | Freq: Every day | ORAL | 3 refills | Status: DC

## 2022-04-28 MED ORDER — MELOXICAM 15 MG PO TABS: 15.0000 mg | ORAL_TABLET | Freq: Every day | ORAL | 0 refills | Status: DC

## 2022-04-28 NOTE — Telephone Encounter (Signed)
Tried calling patient. Left detailed message requesting that she call the office back to schedule follow up appointment for medication refills (OK per DPR).

## 2022-04-28 NOTE — Progress Notes (Unsigned)
I,Roshena L Chambers,acting as a Education administrator for [employer], PA-C.,have documented all relevant documentation on the behalf of Mardene Speak, PA-C,as directed by  [employer], PA-C while in the presence of [employer], PA-C.   Established patient visit   Patient: Kelly Matthews   DOB: Dec 18, 1965   56 y.o. Female  MRN: [id number] Visit Date: 04/28/2022  Today's healthcare provider: Mardene Speak, PA-C   Chief Complaint  Patient presents with   Back Pain   Hip Pain   Sinus Problem   Subjective    HPI  Follow up for Chronic bilateral low back pain with left-sided sciatica: Was diagnosed in 2021. Patient is currently taking Meloxicam to help with back pain. She reports having right hip pain that started 1 week ago.    She reports good compliance with treatment. She feels that condition is Unchanged. Tried Steroids in the past, had a reaction and ended up in ED. Has DM and developed DKA after taking steroids.  -----------------------------------------------------------------------------------------  Sinus problem: Patient complains of sinus pressure and congestion for the past 4 days. Associated symptoms includes headache above her eyes and scratchy throat. She has been taking Claritin, Mucinex and Tylenol for symptom relief.   Medications: Outpatient Medications Prior to Visit  Medication Sig   atorvastatin (LIPITOR) 10 MG tablet Take 10 mg by mouth daily.   Continuous Blood Gluc Receiver (DEXCOM G6 RECEIVER) DEVI USE FOR CONTINUOUS BLOOD GLUCOSE MONITORING   Continuous Blood Gluc Sensor (DEXCOM G6 SENSOR) MISC USE FOR CONTINUOUS BLOOD GLUCOSE MONITORING REPLACE SENSOR EVERY 10 DAYS   Continuous Blood Gluc Transmit (DEXCOM G6 TRANSMITTER) MISC USE FOR CONTINUOUS BLOOD GLUCOSE MONITORING REPLACE TRANSMITTER EVERY 90 DAYS   cyclobenzaprine (FLEXERIL) 5 MG tablet TAKE 1 TABLET BY MOUTH THREE TIMES A DAY AS NEEDED FOR MUSCLE SPASMS   cyclobenzaprine (FLEXERIL) 5 MG  tablet Take by mouth.   Dapagliflozin-Metformin HCl ER 03-999 MG TB24 Take by mouth.   diltiazem (TIAZAC) 120 MG 24 hr capsule Take 1 capsule by mouth 1 day or 1 dose.   gabapentin (NEURONTIN) 300 MG capsule Take 300 mg by mouth 3 (three) times daily.   ibuprofen (ADVIL) 600 MG tablet Take 1 tablet (600 mg total) by mouth every 6 (six) hours as needed.   Lancets (ACCU-CHEK SOFT TOUCH) lancets Use as instructed   meloxicam (MOBIC) 15 MG tablet Take 1 tablet by mouth daily.   meloxicam (MOBIC) 15 MG tablet TAKE 1 TABLET (15 MG TOTAL) BY MOUTH DAILY.   montelukast (SINGULAIR) 10 MG tablet TAKE 1 TABLET BY MOUTH EVERY DAY   sertraline (ZOLOFT) 25 MG tablet TAKE 1/2 TABLET DAILY FOR 8 DAYS, THEN INCREASE TO 1 TABLET DAILY.   traZODone (DESYREL) 50 MG tablet Take 1-2 tablets (50-100 mg total) by mouth at bedtime as needed (for insomnia). for sleep   triamcinolone ointment (KENALOG) 0.1 % PLEASE SEE ATTACHED FOR DETAILED DIRECTIONS   [DISCONTINUED] benzonatate (TESSALON PERLES) 100 MG capsule Take 1 capsule (100 mg total) by mouth 3 (three) times daily as needed. (Patient not taking: Reported on 04/28/2022)   [DISCONTINUED] fluticasone (FLONASE) 50 MCG/ACT nasal spray Place 2 sprays into both nostrils daily.   [DISCONTINUED] predniSONE (DELTASONE) 10 MG tablet Take 1 tablet (10 mg total) by mouth daily with breakfast.   No facility-administered medications prior to visit.    Review of Systems  Constitutional:  Negative for appetite change, chills, fatigue and fever.  HENT:  Positive for congestion, sinus pressure and sinus pain.  Scratchy throat  Respiratory:  Negative for chest tightness and shortness of breath.   Cardiovascular:  Negative for chest pain and palpitations.  Gastrointestinal:  Negative for abdominal pain, nausea and vomiting.  Musculoskeletal:  Positive for arthralgias (hip pain) and back pain.  Neurological:  Positive for headaches. Negative for dizziness and weakness.        Objective    BP 126/72 (BP Location: Left Arm, Patient Position: Sitting, Cuff Size: Large)   Pulse 78   Temp 98 F (36.7 C) (Oral)   Resp 14   Wt 212 lb (96.2 kg)   LMP 04/22/2017 Comment: urine pregnancy test negative  SpO2 99% Comment: room air  BMI 41.40 kg/m    Physical Exam Vitals reviewed.  Constitutional:      General: She is in acute distress.     Appearance: Normal appearance. She is obese.  HENT:     Head: Normocephalic and atraumatic.     Right Ear: Tympanic membrane, ear canal and external ear normal.     Left Ear: There is impacted cerumen (partially).     Nose: Congestion and rhinorrhea present.     Mouth/Throat:     Pharynx: Posterior oropharyngeal erythema (mild) present. No oropharyngeal exudate.  Eyes:     General:        Right eye: No discharge.        Left eye: No discharge.     Extraocular Movements: Extraocular movements intact.     Conjunctiva/sclera: Conjunctivae normal.     Pupils: Pupils are equal, round, and reactive to light.  Musculoskeletal:        General: Tenderness (low back bilaterally) present. No swelling.     Right lower leg: Edema (mild, chronic, ankle) present.     Left lower leg: Edema (mild, chronic, ankle) present.  Neurological:     General: No focal deficit present.     Mental Status: She is alert and oriented to person, place, and time.  Psychiatric:        Behavior: Behavior normal.        Thought Content: Thought content normal.        Judgment: Judgment normal.     No results found for any visits on 04/28/22.  Assessment & Plan     1. Chronic bilateral low back pain with left-sided sciatica Recommended to use Tylenol daily and add Meloxicam PRN - meloxicam (MOBIC) 15 MG tablet; Take 1 tablet (15 mg total) by mouth daily.  Dispense: 30 tablet; Refill: 0 - omeprazole (PRILOSEC) 20 MG capsule; Take 1 capsule (20 mg total) by mouth daily.  Dispense: 30 capsule; Refill: 3 - Ambulatory referral to Orthopedic  Surgery  2. Leg swelling, bilateral Recommended to continue with compression socks, keeping her legs elevated  3. Acute frontal sinusitis, recurrence not specified Symptomatic treatment recommended.  FU as scheduled. The patient was advised to call back or seek an in-person evaluation if the symptoms worsen or if the condition fails to improve as anticipated.  I discussed the assessment and treatment plan with the patient. The patient was provided an opportunity to ask questions and all were answered. The patient agreed with the plan and demonstrated an understanding of the instructions.  The entirety of the information documented in the History of Present Illness, Review of Systems and Physical Exam were personally obtained by me. Portions of this information were initially documented by the CMA and reviewed by me for thoroughness and accuracy.  Portions of this note were created  using dictation software and may contain typographical errors.      Mardene Speak, PA-C  [facility] [phone] (phone) [phone] (fax)  [place]

## 2022-04-30 ENCOUNTER — Telehealth: Payer: 59 | Admitting: Physician Assistant

## 2022-04-30 DIAGNOSIS — J019 Acute sinusitis, unspecified: Secondary | ICD-10-CM

## 2022-04-30 DIAGNOSIS — B9689 Other specified bacterial agents as the cause of diseases classified elsewhere: Secondary | ICD-10-CM | POA: Diagnosis not present

## 2022-04-30 MED ORDER — DOXYCYCLINE HYCLATE 100 MG PO TABS: 100.0000 mg | ORAL_TABLET | Freq: Two times a day (BID) | ORAL | 0 refills | Status: DC

## 2022-04-30 NOTE — Progress Notes (Signed)

## 2022-04-30 NOTE — Progress Notes (Signed)
I have spent 5 minutes in review of e-visit questionnaire, review and updating patient chart, medical decision making and response to patient.   Magie Ciampa Cody Lilymae Swiech, PA-C    

## 2022-05-18 ENCOUNTER — Other Ambulatory Visit: Payer: Self-pay | Admitting: Family Medicine

## 2022-05-18 DIAGNOSIS — G4700 Insomnia, unspecified: Secondary | ICD-10-CM

## 2022-05-27 ENCOUNTER — Other Ambulatory Visit: Payer: Self-pay | Admitting: Physician Assistant

## 2022-05-27 DIAGNOSIS — G8929 Other chronic pain: Secondary | ICD-10-CM

## 2022-06-30 ENCOUNTER — Telehealth: Payer: 59 | Admitting: Physician Assistant

## 2022-06-30 DIAGNOSIS — J019 Acute sinusitis, unspecified: Secondary | ICD-10-CM | POA: Diagnosis not present

## 2022-06-30 MED ORDER — DOXYCYCLINE HYCLATE 100 MG PO CAPS
100.0000 mg | ORAL_CAPSULE | Freq: Two times a day (BID) | ORAL | 0 refills | Status: AC
Start: 2022-06-30 — End: 2022-07-07

## 2022-06-30 NOTE — Progress Notes (Signed)

## 2022-07-01 ENCOUNTER — Ambulatory Visit: Payer: Self-pay | Admitting: *Deleted

## 2022-07-01 ENCOUNTER — Ambulatory Visit (INDEPENDENT_AMBULATORY_CARE_PROVIDER_SITE_OTHER): Payer: 59 | Admitting: Family Medicine

## 2022-07-01 ENCOUNTER — Encounter: Payer: Self-pay | Admitting: Family Medicine

## 2022-07-01 VITALS — BP 140/86 | HR 103 | Temp 98.3°F | Resp 16 | Wt 217.0 lb

## 2022-07-01 DIAGNOSIS — J019 Acute sinusitis, unspecified: Secondary | ICD-10-CM

## 2022-07-01 NOTE — Progress Notes (Signed)
    SUBJECTIVE:   CHIEF COMPLAINT / HPI:   UPPER RESPIRATORY TRACT INFECTION - symptom onset Thursday - last night fever Tmax 102.73F - COVID negative 2x yesterday - telehealth appt yesterday - doxycycline, albuterol, iprotropium, tessalon.   Fever: yes Cough: yes Shortness of breath: no Wheezing: yes Chest pain: no Chest tightness: no Chest congestion: yes Nasal congestion: yes Runny nose: yes Sneezing: yes Sore throat: yes Sinus pressure: yes Headache: yes Ear pain: no  Ear pressure: no  Vomiting: no Fatigue: yes Sick contacts: yes Treatments attempted: anti-histamine, cough syrup, and antibiotics    OBJECTIVE:   BP (!) 140/86 (BP Location: Left Arm, Patient Position: Sitting, Cuff Size: Large)   Pulse (!) 103   Temp 98.3 F (36.8 C) (Oral)   Resp 16   Wt 217 lb (98.4 kg)   LMP 04/22/2017 Comment: urine pregnancy test negative  SpO2 96%   BMI 42.38 kg/m   Gen: tired appearing, in NAD Card: RRR Lungs: CTAB. No wheeze, rales, rhonchi. Ext: WWP, no edema   ASSESSMENT/PLAN:   URI, Sinusitis Moderate sx. COVID negative. Already on antibiotics for presumed bacterial sinusitis, continue. No evidence of pneumonia, wheeze on exam today. Continue current treatment. Note provided for work.  Reviewed return and emergency precautions.      Myles Gip, DO

## 2022-07-01 NOTE — Telephone Encounter (Signed)
  Chief Complaint: Fever, headache, congestion   Did one Tele health visit put on antibiotics, not better Symptoms: coughing up thick green mucus, wheezing   2 Covid tests negative Frequency: Daily Pertinent Negatives: Patient denies shortness of breath.     Disposition: '[]'$ ED /'[]'$ Urgent Care (no appt availability in office) / '[x]'$ Appointment(In office/virtual)/ '[]'$  [place] [facility]/ '[]'$ Home Care/ '[]'$ Refused Recommended Disposition /'[]'$ [place] [facility]/ '[]'$  Follow-up with PCP Additional Notes: Appt made with Dr. Ky Barban for 9:20 this morning.

## 2022-07-01 NOTE — Telephone Encounter (Signed)
Reason for Disposition  [1] Continuous (nonstop) coughing interferes with work or school AND [2] no improvement using cough treatment per Care Advice  Answer Assessment - Initial Assessment Questions 1. ONSET: "When did the cough begin?"      Coughing up thick green mucus, headache fever won't go away.    I did a telehealth visit too.   But fever won't stay down.    Covid negative. 2. SEVERITY: "How bad is the cough today?"      It's coughing 3. SPUTUM: "Describe the color of your sputum" (none, dry cough; clear, white, yellow, green)     Green mucus 4. HEMOPTYSIS: "Are you coughing up any blood?" If so ask: "How much?" (flecks, streaks, tablespoons, etc.)     No 5. DIFFICULTY BREATHING: "Are you having difficulty breathing?" If Yes, ask: "How bad is it?" (e.g., mild, moderate, severe)    - MILD: No SOB at rest, mild SOB with walking, speaks normally in sentences, can lie down, no retractions, pulse < 100.    - MODERATE: SOB at rest, SOB with minimal exertion and prefers to sit, cannot lie down flat, speaks in phrases, mild retractions, audible wheezing, pulse 100-120.    - SEVERE: Very SOB at rest, speaks in single words, struggling to breathe, sitting hunched forward, retractions, pulse > 120      I'm wheezing 6. FEVER: "Do you have a fever?" If Yes, ask: "What is your temperature, how was it measured, and when did it start?"     Yes 7. CARDIAC HISTORY: "Do you have any history of heart disease?" (e.g., heart attack, congestive heart failure)      *No Answer* 8. LUNG HISTORY: "Do you have any history of lung disease?"  (e.g., pulmonary embolus, asthma, emphysema)     *No Answer* 9. PE RISK FACTORS: "Do you have a history of blood clots?" (or: recent major surgery, recent prolonged travel, bedridden)     *No Answer* 10. OTHER SYMPTOMS: "Do you have any other symptoms?" (e.g., runny nose, wheezing, chest pain)       *No Answer* 11. PREGNANCY: "Is there any chance you are pregnant?" "When  was your last menstrual period?"       *No Answer* 12. TRAVEL: "Have you traveled out of the country in the last month?" (e.g., travel history, exposures)       *No Answer*  Protocols used: Cough - Acute Productive-A-AH

## 2022-07-02 ENCOUNTER — Ambulatory Visit: Payer: Self-pay | Admitting: *Deleted

## 2022-07-02 NOTE — Telephone Encounter (Signed)
  Chief Complaint: Coughing up blood tinged thick, gree mucus this morning.   Seen yesterday by Dr. Ky Barban. Symptoms: Thick green mucus being coughed up and from her nose.  Mixed with bloody mucus Frequency: This morning Pertinent Negatives: Patient denies shortness of breath or chest discomfort.   No fever this morning Disposition: '[]'$ ED /'[]'$ Urgent Care (no appt availability in office) / '[]'$ Appointment(In office/virtual)/ '[]'$  [place] [facility]/ '[]'$ Home Care/ '[]'$ Refused Recommended Disposition /'[]'$ [place] [facility]/ '[x]'$  Follow-up with PCP Additional Notes: Since you were seen yesterday I'm sending Dr. Ky Barban a note letting her know about the bloody sputum.   You mentioned not being able to afford to come back in for a visit.  Would rather not if possible. Pt agreeable to someone calling her back with Dr. Mary Sella recommendation.

## 2022-07-02 NOTE — Telephone Encounter (Signed)
Reason for Disposition  [1] Coughed up blood AND [2] > 1 tablespoon (15 ml)   (Exception: Blood-tinged sputum.)    Saw Dr. Ky Barban yesterday.  Answer Assessment - Initial Assessment Questions 1. ONSET: "When did the cough begin?"      I was seen yesterday by Dr. Ky Barban and I have a bacterial infection.   Now I'm coughing up blood.   I did a [place] [facility] visit and was given an antibiotic.   Before seeing Dr. Ky Barban My fever keeps going up and down.    2. SEVERITY: "How bad is the cough today?"      today I'm having bloody sputum along with the thick green mucus.   3. SPUTUM: "Describe the color of your sputum" (none, dry cough; clear, white, yellow, green)     Yes Nyoka Cowden   I've been sick since last [employer].    I'm coughing a lot at night and it's hard to sleep. Not tested for Covid in the since Tues.  Was negative on Tues.   I had Covid 3 times already. 4. HEMOPTYSIS: "Are you coughing up any blood?" If so ask: "How much?" (flecks, streaks, tablespoons, etc.)     Yes noticed it this morning mixed in with green thick mucus 5. DIFFICULTY BREATHING: "Are you having difficulty breathing?" If Yes, ask: "How bad is it?" (e.g., mild, moderate, severe)    - MILD: No SOB at rest, mild SOB with walking, speaks normally in sentences, can lie down, no retractions, pulse < 100.    - MODERATE: SOB at rest, SOB with minimal exertion and prefers to sit, cannot lie down flat, speaks in phrases, mild retractions, audible wheezing, pulse 100-120.    - SEVERE: Very SOB at rest, speaks in single words, struggling to breathe, sitting hunched forward, retractions, pulse > 120      No shortness of breath 6. FEVER: "Do you have a fever?" If Yes, ask: "What is your temperature, how was it measured, and when did it start?"     It goes up and down.  Yesterday 101.   This morning it's 98.2.    I'm alternating Aleve and Tylenol.  None of that on board now. 7. CARDIAC HISTORY: "Do you have any history of heart disease?" (e.g.,  heart attack, congestive heart failure)      Not asked 8. LUNG HISTORY: "Do you have any history of lung disease?"  (e.g., pulmonary embolus, asthma, emphysema)     Not asked 9. PE RISK FACTORS: "Do you have a history of blood clots?" (or: recent major surgery, recent prolonged travel, bedridden)     Not asked 10. OTHER SYMPTOMS: "Do you have any other symptoms?" (e.g., runny nose, wheezing, chest pain)       Scratchy throat no longer sore, runny nose with green mucus.   No diarrhea or vomiting.  Nausea 11. PREGNANCY: "Is there any chance you are pregnant?" "When was your last menstrual period?"       Not asked due to age 34. TRAVEL: "Have you traveled out of the country in the last month?" (e.g., travel history, exposures)       Not asked  Protocols used: Cough - Acute Productive-A-AH

## 2022-07-11 ENCOUNTER — Other Ambulatory Visit: Payer: Self-pay | Admitting: Family Medicine

## 2022-07-19 ENCOUNTER — Other Ambulatory Visit: Payer: Self-pay

## 2022-07-19 ENCOUNTER — Emergency Department: Payer: 59

## 2022-07-19 ENCOUNTER — Emergency Department
Admission: EM | Admit: 2022-07-19 | Discharge: 2022-07-20 | Disposition: A | Discharge status: Home / Self Care | Payer: 59 | Attending: Emergency Medicine | Admitting: Emergency Medicine

## 2022-07-19 DIAGNOSIS — R059 Cough, unspecified: Secondary | ICD-10-CM | POA: Diagnosis present

## 2022-07-19 DIAGNOSIS — E1110 Type 2 diabetes mellitus with ketoacidosis without coma: Secondary | ICD-10-CM | POA: Insufficient documentation

## 2022-07-19 DIAGNOSIS — R051 Acute cough: Secondary | ICD-10-CM | POA: Diagnosis not present

## 2022-07-19 DIAGNOSIS — R0602 Shortness of breath: Secondary | ICD-10-CM | POA: Insufficient documentation

## 2022-07-19 DIAGNOSIS — Z20822 Contact with and (suspected) exposure to covid-19: Secondary | ICD-10-CM | POA: Diagnosis not present

## 2022-07-19 DIAGNOSIS — I10 Essential (primary) hypertension: Secondary | ICD-10-CM | POA: Insufficient documentation

## 2022-07-19 LAB — RESP PANEL BY RT-PCR (FLU A&B, COVID) ARPGX2
Influenza A by PCR: NEGATIVE
Influenza B by PCR: NEGATIVE
SARS Coronavirus 2 by RT PCR: NEGATIVE

## 2022-07-19 LAB — COMPREHENSIVE METABOLIC PANEL
ALT: 32 U/L (ref 0–44)
AST: 29 U/L (ref 15–41)
Albumin: 4.4 g/dL (ref 3.5–5.0)
Alkaline Phosphatase: 91 U/L (ref 38–126)
Anion gap: 9 (ref 5–15)
BUN: 12 mg/dL (ref 6–20)
CO2: 24 mmol/L (ref 22–32)
Calcium: 9.7 mg/dL (ref 8.9–10.3)
Chloride: 104 mmol/L (ref 98–111)
Creatinine, Ser: 0.6 mg/dL (ref 0.44–1.00)
GFR, Estimated: >60 mL/min (ref >60)
Glucose, Bld: 103 mg/dL — ABNORMAL HIGH (ref 70–99)
Potassium: 3.4 mmol/L — ABNORMAL LOW (ref 3.5–5.1)
Sodium: 137 mmol/L (ref 135–145)
Total Bilirubin: 0.9 mg/dL (ref 0.3–1.2)
Total Protein: 8.1 g/dL (ref 6.5–8.1)

## 2022-07-19 LAB — CBC WITH DIFFERENTIAL/PLATELET
Abs Immature Granulocytes: 0.03 10*3/uL (ref 0.00–0.07)
Basophils Absolute: 0.1 10*3/uL (ref 0.0–0.1)
Basophils Relative: 1 %
Eosinophils Absolute: 0.1 10*3/uL (ref 0.0–0.5)
Eosinophils Relative: 1 %
HCT: 37.8 % (ref 36.0–46.0)
Hemoglobin: 12.2 g/dL (ref 12.0–15.0)
Immature Granulocytes: 0 %
Lymphocytes Relative: 28 %
Lymphs Abs: 2.2 10*3/uL (ref 0.7–4.0)
MCH: 26.3 pg (ref 26.0–34.0)
MCHC: 32.3 g/dL (ref 30.0–36.0)
MCV: 81.6 fL (ref 80.0–100.0)
Monocytes Absolute: 0.4 10*3/uL (ref 0.1–1.0)
Monocytes Relative: 5 %
Neutro Abs: 5.1 10*3/uL (ref 1.7–7.7)
Neutrophils Relative %: 65 %
Platelets: 202 10*3/uL (ref 150–400)
RBC: 4.63 MIL/uL (ref 3.87–5.11)
RDW: 14.9 % (ref 11.5–15.5)
WBC: 7.8 10*3/uL (ref 4.0–10.5)
nRBC: 0 % (ref 0.0–0.2)

## 2022-07-19 LAB — LIPASE, BLOOD: Lipase: 34 U/L (ref 11–51)

## 2022-07-19 LAB — TROPONIN I (HIGH SENSITIVITY): Troponin I (High Sensitivity): 3 ng/L (ref <18)

## 2022-07-19 MED ORDER — BENZONATATE 100 MG PO CAPS: 100.0000 mg | ORAL_CAPSULE | Freq: Three times a day (TID) | ORAL | 0 refills | Status: AC | PRN

## 2022-07-19 MED ORDER — IOHEXOL 350 MG/ML SOLN
75.0000 mL | Freq: Once | INTRAVENOUS | Status: AC | PRN
  Administered 2022-07-19: 75 mL via INTRAVENOUS

## 2022-07-19 MED ORDER — DEXAMETHASONE SODIUM PHOSPHATE 10 MG/ML IJ SOLN
10.0000 mg | Freq: Once | INTRAMUSCULAR | Status: AC
  Administered 2022-07-20: 10 mg via INTRAVENOUS
  Filled 2022-07-19: qty 1

## 2022-07-19 NOTE — ED Triage Notes (Signed)
Pt states she was seen Thursday at [facility] and diagnosed with pneumonia- pt was placed on 2 different antibiotics but feels like she is getting worse

## 2022-07-19 NOTE — Discharge Instructions (Addendum)
You can take Tessalon Perles up to 3 times daily for cough.

## 2022-07-19 NOTE — ED Provider Triage Note (Signed)
Emergency Medicine Provider Triage Evaluation Note  Kelly Matthews, a 56 y.o. female  was evaluated in triage.  Pt complains of going cough, congestion, and shortness of breath.  Patient had been treated 2 weeks ago with an empiric course of doxycycline after an ED visit.  She completed a course but denies any significant change in her symptoms.  She was subsequently given a nasal steroid and an inhaler during another televisit.  She finally reported to [facility] urgent care where she was diagnosed with a pneumonia without a chest x-ray, and started on azithromycin and cefpodoxime.   Review of Systems  Positive: Cough, congestion, SOB Negative: NVD  Physical Exam  BP (!) 149/105 (BP Location: Left Arm)   Pulse 87   Temp 98.4 F (36.9 C) (Oral)   Resp 20   Ht 5' (1.524 m)   Wt 95.3 kg   LMP 04/22/2017 Comment: urine pregnancy test negative  SpO2 97%   BMI 41.01 kg/m  Gen:   Awake, no distress  NAD Resp:  Normal effort CTA MSK:   Moves extremities without difficulty  CVS:  RRR  Medical Decision Making  Medically screening exam initiated at 6:05 PM.  Appropriate orders placed.  Kelly Matthews was informed that the remainder of the evaluation will be completed by another provider, this initial triage assessment does not replace that evaluation, and the importance of remaining in the ED until their evaluation is complete.  Patient to the ED for ongoing shortness of breath, cough, congestion with a recently diagnosed pneumonia without x-ray imaging.   Melvenia Needles, PA-C 07/19/22 1807

## 2022-07-19 NOTE — ED Provider Notes (Signed)
[facility] Provider Note  Patient Contact: 9:04 PM (approximate)   History   Cough   HPI  Kelly Matthews is a 56 y.o. female with a history of diabetes, DKA and hypertension, presents to the emergency department, with cough that is occurred intermittently for the past month after patient returned from a trip to [place].  Patient states that she has right upper back pain that worsens when she takes a deep breath.  She has finished a course of doxycycline and is currently taking azithromycin and cefpodoxime with no improvement in her symptoms.  She states that she has breathless with prolonged coughing and exertion.  No prior history of DVT or PE.      Physical Exam   Triage Vital Signs: ED Triage Vitals  Enc Vitals Group     BP 07/19/22 1756 (!) 149/105     Pulse Rate 07/19/22 1756 87     Resp 07/19/22 1756 20     Temp 07/19/22 1756 98.4 F (36.9 C)     Temp Source 07/19/22 1756 Oral     SpO2 07/19/22 1756 97 %     Weight 07/19/22 1757 210 lb (95.3 kg)     Height 07/19/22 1757 5' (1.524 m)     Head Circumference --      Peak Flow --      Pain Score 07/19/22 1757 0     Pain Loc --      Pain Edu? --      Excl. in [place]? --     Most recent vital signs: Vitals:   07/19/22 1756 07/19/22 2143  BP: (!) 149/105 137/88  Pulse: 87 86  Resp: 20 20  Temp: 98.4 F (36.9 C) 97.9 F (36.6 C)  SpO2: 97% 98%     General: Alert and in no acute distress. Eyes:  PERRL. EOMI. Head: No acute traumatic findings ENT:      Nose: No congestion/rhinnorhea.      Mouth/Throat: Mucous membranes are moist. Neck: No stridor. No cervical spine tenderness to palpation. Cardiovascular:  Good peripheral perfusion Respiratory: Normal respiratory effort without tachypnea or retractions. Lungs CTAB. Good air entry to the bases with no decreased or absent breath sounds. Gastrointestinal: Bowel sounds 4 quadrants. Soft and nontender to palpation. No guarding or  rigidity. No palpable masses. No distention. No CVA tenderness. Musculoskeletal: Full range of motion to all extremities.  Neurologic:  No gross focal neurologic deficits are appreciated.  Skin:   No rash noted Other:   ED Results / Procedures / Treatments   Labs (all labs ordered are listed, but only abnormal results are displayed) Labs Reviewed  COMPREHENSIVE METABOLIC PANEL - Abnormal; Notable for the following components:      Result Value   Potassium 3.4 (*)    Glucose, Bld 103 (*)    All other components within normal limits  RESP PANEL BY RT-PCR (FLU A&B, COVID) ARPGX2  CBC WITH DIFFERENTIAL/PLATELET  LIPASE, BLOOD  TROPONIN I (HIGH SENSITIVITY)     EKG  Normal sinus rhythm without ST segment elevation or other apparent arrhythmia.   RADIOLOGY  I personally viewed and evaluated these images as part of my medical decision making, as well as reviewing the written report by the radiologist.  ED Provider Interpretation: No acute abnormality on CTA.  No evidence of pneumonia or PE.   PROCEDURES:  Critical Care performed: No  Procedures   MEDICATIONS ORDERED IN ED: Medications  dexamethasone (DECADRON) injection 10  mg (has no administration in time range)  iohexol (OMNIPAQUE) 350 MG/ML injection 75 mL (75 mLs Intravenous Contrast Given 07/19/22 2313)     IMPRESSION / MDM / ASSESSMENT AND PLAN / ED COURSE  I reviewed the triage vital signs and the nursing notes.                              Assessment and plan Cough Shortness of breath 56 year old female presents to the emergency department with cough and shortness of breath that is occurred off and on for the past month.  Vital signs were reassuring at triage.  On exam, patient was alert but did endorse reproducible pain with deep inspiration and right upper back pain.  Chest x-ray unremarkable.  Will obtain basic labs and CT angio chest rule out PE.  CBC, CMP and troponin within range.  Lipase within  range.  CTA of the chest unremarkable for pneumonia or PE.  Patient was given a dose of Decadron in the emergency department prior to discharge and sent home with [facility].   FINAL CLINICAL IMPRESSION(S) / ED DIAGNOSES   Final diagnoses:  Acute cough     Rx / DC Orders   ED Discharge Orders          Ordered    benzonatate (TESSALON PERLES) 100 MG capsule  3 times daily PRN        07/19/22 2353             Note:  This document was prepared using Dragon voice recognition software and may include unintentional dictation errors.   Vallarie Mare [place], PA-C 07/19/22 2357    Harvest Dark, MD 07/22/22 [phone]

## 2022-07-20 ENCOUNTER — Telehealth: Payer: 59

## 2022-07-21 ENCOUNTER — Encounter: Payer: Self-pay | Admitting: Physician Assistant

## 2022-07-21 ENCOUNTER — Ambulatory Visit (INDEPENDENT_AMBULATORY_CARE_PROVIDER_SITE_OTHER): Payer: 59 | Admitting: Physician Assistant

## 2022-07-21 VITALS — BP 131/70 | HR 87 | Temp 98.2°F | Resp 18 | Ht 60.0 in | Wt 212.0 lb

## 2022-07-21 DIAGNOSIS — G4700 Insomnia, unspecified: Secondary | ICD-10-CM

## 2022-07-21 DIAGNOSIS — F419 Anxiety disorder, unspecified: Secondary | ICD-10-CM | POA: Diagnosis not present

## 2022-07-21 DIAGNOSIS — M5442 Lumbago with sciatica, left side: Secondary | ICD-10-CM

## 2022-07-21 DIAGNOSIS — G8929 Other chronic pain: Secondary | ICD-10-CM

## 2022-07-21 DIAGNOSIS — R053 Chronic cough: Secondary | ICD-10-CM

## 2022-07-21 MED ORDER — GABAPENTIN 300 MG PO CAPS: 300.0000 mg | ORAL_CAPSULE | Freq: Three times a day (TID) | ORAL | 0 refills | Status: DC

## 2022-07-21 MED ORDER — SERTRALINE HCL 50 MG PO TABS: 50.0000 mg | ORAL_TABLET | Freq: Every day | ORAL | 3 refills | Status: DC

## 2022-07-21 NOTE — Progress Notes (Signed)
I,Tiffany J Bragg,acting as a Education administrator for [employer], PA-C.,have documented all relevant documentation on the behalf of Kelly Speak, PA-C,as directed by  [employer], PA-C while in the presence of [employer], PA-C.   Established patient visit   Patient: Kelly Matthews   DOB: 12-12-1965   56 y.o. Female  MRN: [id number] Visit Date: 07/21/2022  Today's healthcare provider: Mardene Speak, PA-C   No chief complaint on file.  Subjective    HPI  Follow up Hospitalization  Patient was admitted to [facility] on 9/3 and discharged on 9/4. She was treated for cough. Covid in July,  Treatment for this included xray, CT, labs. Given decadron and Tessalon Perles before discharge Pt completed a course of Z pack and cephalosporin yesterday  She reports excellent compliance with treatment. She reports this condition is stayed the same. States she was diagnosed with bronchitis. Fever and cough have continued off and on. Pt reports that she has been having Covid -19 infection in July and a sinusitis /treated with doxycyline in 06/2022. She works at preschool. Also wants to discuss anything she can take to help her immune system as she has been sick off and on all year.  Wants refill of zoloft and gabapentin.  ----------------------------------------------------------------------------------------- -   Medications: Outpatient Medications Prior to Visit  Medication Sig   albuterol (VENTOLIN HFA) 108 (90 Base) MCG/ACT inhaler Inhale into the lungs.   atorvastatin (LIPITOR) 10 MG tablet Take 10 mg by mouth daily.   benzonatate (TESSALON PERLES) 100 MG capsule Take 1 capsule (100 mg total) by mouth 3 (three) times daily as needed for up to 7 days for cough.   Continuous Blood Gluc Receiver (DEXCOM G6 RECEIVER) DEVI USE FOR CONTINUOUS BLOOD GLUCOSE MONITORING   Continuous Blood Gluc Sensor (DEXCOM G6 SENSOR) MISC USE FOR CONTINUOUS BLOOD GLUCOSE MONITORING REPLACE SENSOR EVERY 10 DAYS    Continuous Blood Gluc Transmit (DEXCOM G6 TRANSMITTER) MISC USE FOR CONTINUOUS BLOOD GLUCOSE MONITORING REPLACE TRANSMITTER EVERY 90 DAYS   Dapagliflozin-Metformin HCl ER 03-999 MG TB24 Take by mouth.   gabapentin (NEURONTIN) 300 MG capsule Take 300 mg by mouth 3 (three) times daily.   ibuprofen (ADVIL) 600 MG tablet Take 1 tablet (600 mg total) by mouth every 6 (six) hours as needed.   ipratropium (ATROVENT) 0.03 % nasal spray Place into both nostrils.   Lancets (ACCU-CHEK SOFT TOUCH) lancets Use as instructed   meloxicam (MOBIC) 15 MG tablet TAKE 1 TABLET (15 MG TOTAL) BY MOUTH DAILY.   omeprazole (PRILOSEC) 20 MG capsule Take 1 capsule (20 mg total) by mouth daily.   sertraline (ZOLOFT) 25 MG tablet TAKE 1/2 TABLET DAILY FOR 8 DAYS, THEN INCREASE TO 1 TABLET DAILY.   traZODone (DESYREL) 50 MG tablet TAKE 1-2 TABLETS (50-100 MG TOTAL) BY MOUTH AT BEDTIME AS NEEDED (FOR INSOMNIA). FOR SLEEP   No facility-administered medications prior to visit.    Review of Systems  All other systems reviewed and are negative.  Except see HPI     Objective    LMP 04/22/2017 Comment: urine pregnancy test negative   Physical Exam Vitals reviewed.  Constitutional:      General: She is not in acute distress.    Appearance: Normal appearance. She is well-developed. She is not diaphoretic.  HENT:     Head: Normocephalic and atraumatic.     Right Ear: Tympanic membrane, ear canal and external ear normal.     Left Ear: Tympanic membrane, ear canal and external  ear normal.     Nose: Congestion and rhinorrhea present.     Mouth/Throat:     Pharynx: Posterior oropharyngeal erythema present.  Eyes:     General: No scleral icterus.       Right eye: No discharge.        Left eye: No discharge.     Extraocular Movements: Extraocular movements intact.     Conjunctiva/sclera: Conjunctivae normal.     Pupils: Pupils are equal, round, and reactive to light.  Neck:     Thyroid: No thyromegaly.   Cardiovascular:     Rate and Rhythm: Normal rate and regular rhythm.     Pulses: Normal pulses.     Heart sounds: Normal heart sounds. No murmur heard. Pulmonary:     Effort: Pulmonary effort is normal. No respiratory distress.     Breath sounds: Normal breath sounds. No wheezing, rhonchi or rales.  Chest:     Chest wall: No tenderness.  Musculoskeletal:     Cervical back: Neck supple.     Right lower leg: No edema.     Left lower leg: No edema.  Lymphadenopathy:     Cervical: No cervical adenopathy.  Skin:    General: Skin is warm and dry.     Findings: No rash.  Neurological:     Mental Status: She is alert and oriented to person, place, and time. Mental status is at baseline.  Psychiatric:        Mood and Affect: Mood normal.        Behavior: Behavior normal.        Thought Content: Thought content normal.        Judgment: Judgment normal.       No results found for any visits on 07/21/22.  Assessment & Plan     Anxiety Insomnia, unspecified type - sertraline (ZOLOFT) 50 MG tablet; Take 1 tablet (50 mg total) by mouth daily.  Dispense: 30 tablet; Refill: C  Chronic bilateral low back pain with left-sided sciatica  - gabapentin (NEURONTIN) 300 MG capsule; Take 1 capsule (300 mg total) by mouth 3 (three) times daily.  Dispense: 90 capsule; Refill: 0  Chronic cough/URI/sinusitis Continue with Tessalon or mucinex or albuterol Continue symptomatic treatment - Ambulatory referral to Pulmonology     Will reassess if symptoms persist Provided a work note for two days  The patient was advised to call back or seek an in-person evaluation if the symptoms worsen or if the condition fails to improve as anticipated.  I discussed the assessment and treatment plan with the patient. The patient was provided an opportunity to ask questions and all were answered. The patient agreed with the plan and demonstrated an understanding of the instructions.  The entirety of the  information documented in the History of Present Illness, Review of Systems and Physical Exam were personally obtained by me. Portions of this information were initially documented by the CMA and reviewed by me for thoroughness and accuracy.  I discussed the assessment and treatment plan with the patient  The patient was provided an opportunity to ask questions and all were answered. The patient agreed with the plan and demonstrated an understanding of the instructions.   The patient was advised to call back or seek an in-person evaluation if the symptoms worsen or if the condition fails to improve as anticipated.       Total encounter time more than 30 minutes  Greater than 50% was spent in counseling and coordination of  care with the patient    Elberta Leatherwood  [facility] [phone] (phone) [phone] (fax)  [place]

## 2022-08-12 ENCOUNTER — Other Ambulatory Visit: Payer: Self-pay | Admitting: Physician Assistant

## 2022-08-12 DIAGNOSIS — F419 Anxiety disorder, unspecified: Secondary | ICD-10-CM

## 2022-08-12 NOTE — Telephone Encounter (Signed)
Requested medication (s) are due for refill today: no   Requested medication (s) are on the active medication list: yes  Last refill:  07/21/22 #30 3 refills  Future visit scheduled: no  Notes to clinic:  Pharmacy comment: REQUEST FOR 90 DAYS PRESCRIPTION. DX Code Needed     Requested Prescriptions  Pending Prescriptions Disp Refills   sertraline (ZOLOFT) 50 MG tablet [Pharmacy Med Name: SERTRALINE HCL 50 MG TABLET] 90 tablet 2    Sig: TAKE 1 TABLET BY MOUTH EVERY DAY     Psychiatry:  Antidepressants - SSRI - sertraline Passed - 08/12/2022 12:31 PM      Passed - AST in normal range and within 360 days    AST  Date Value Ref Range Status  07/19/2022 29 15 - 41 U/L Final         Passed - ALT in normal range and within 360 days    ALT  Date Value Ref Range Status  07/19/2022 32 0 - 44 U/L Final         Passed - Completed PHQ-2 or PHQ-9 in the last 360 days      Passed - Valid encounter within last 6 months    Recent Outpatient Visits           3 weeks ago [place], [place], PA-C   1 month ago Acute sinusitis, recurrence not specified, unspecified location   [place], DO   3 months ago Leg swelling   [facility] [place], [place], PA-C   6 months ago [place] Ostwalt, [place], PA-C   11 months ago Bright red rectal bleeding   [facility] Birdie Sons, MD              b

## 2022-08-13 NOTE — Telephone Encounter (Signed)
Please, let pt know about possible interactions with trazodone and NSAIDs

## 2022-09-01 ENCOUNTER — Other Ambulatory Visit: Payer: Self-pay | Admitting: Physician Assistant

## 2022-09-01 DIAGNOSIS — G8929 Other chronic pain: Secondary | ICD-10-CM

## 2022-09-01 NOTE — Telephone Encounter (Signed)
Requested Prescriptions  Pending Prescriptions Disp Refills  . gabapentin (NEURONTIN) 300 MG capsule [Pharmacy Med Name: GABAPENTIN 300 MG CAPSULE] 90 capsule 0    Sig: TAKE 1 CAPSULE BY MOUTH THREE TIMES A DAY     Neurology: Anticonvulsants - gabapentin Passed - 09/01/2022 11:08 AM      Passed - Cr in normal range and within 360 days    Creatinine, Ser  Date Value Ref Range Status  07/19/2022 0.60 0.44 - 1.00 mg/dL Final   Creatinine, POC  Date Value Ref Range Status  08/06/2017 NA mg/dL Final         Passed - Completed PHQ-2 or PHQ-9 in the last 360 days      Passed - Valid encounter within last 12 months    Recent Outpatient Visits          1 month ago [place], [place], PA-C   2 months ago Acute sinusitis, recurrence not specified, unspecified location   [place], DO   4 months ago Leg swelling   [facility] [place], [place], PA-C   7 months ago [place] Ostwalt, [place], PA-C   1 year ago Bright red rectal bleeding   [facility] Birdie Sons, MD

## 2022-09-19 ENCOUNTER — Other Ambulatory Visit: Payer: Self-pay | Admitting: Physician Assistant

## 2022-09-19 DIAGNOSIS — G8929 Other chronic pain: Secondary | ICD-10-CM

## 2022-09-21 NOTE — Telephone Encounter (Signed)
Requested Prescriptions  Pending Prescriptions Disp Refills   meloxicam (MOBIC) 15 MG tablet [Pharmacy Med Name: MELOXICAM 15 MG TABLET] 90 tablet 0    Sig: TAKE 1 TABLET (15 MG TOTAL) BY MOUTH DAILY.     Analgesics:  COX2 Inhibitors Failed - 09/19/2022  3:55 PM      Failed - Manual Review: Labs are only required if the patient has taken medication for more than 8 weeks.      Passed - HGB in normal range and within 360 days    Hemoglobin  Date Value Ref Range Status  07/19/2022 12.2 12.0 - 15.0 g/dL Final  01/07/2021 14.0 11.1 - 15.9 g/dL Final         Passed - Cr in normal range and within 360 days    Creatinine, Ser  Date Value Ref Range Status  07/19/2022 0.60 0.44 - 1.00 mg/dL Final   Creatinine, POC  Date Value Ref Range Status  08/06/2017 NA mg/dL Final         Passed - HCT in normal range and within 360 days    HCT  Date Value Ref Range Status  07/19/2022 37.8 36.0 - 46.0 % Final   Hematocrit  Date Value Ref Range Status  01/07/2021 41.5 34.0 - 46.6 % Final         Passed - AST in normal range and within 360 days    AST  Date Value Ref Range Status  07/19/2022 29 15 - 41 U/L Final         Passed - ALT in normal range and within 360 days    ALT  Date Value Ref Range Status  07/19/2022 32 0 - 44 U/L Final         Passed - eGFR is 30 or above and within 360 days    GFR calc Af Amer  Date Value Ref Range Status  01/07/2021 122 >59 mL/min/1.73 Final    Comment:    **In accordance with recommendations from the NKF-ASN Task force,**   [facility] is in the process of updating its eGFR calculation to the   2021 CKD-EPI creatinine equation that estimates kidney function   without a race variable.    GFR, Estimated  Date Value Ref Range Status  07/19/2022 >60 >60 mL/min Final    Comment:    (NOTE) Calculated using the CKD-EPI Creatinine Equation (2021)          Passed - Patient is not pregnant      Passed - Valid encounter within last 12 months    Recent  Outpatient Visits           2 months ago [place], [place], PA-C   2 months ago Acute sinusitis, recurrence not specified, unspecified location   [place], Jake [place], DO   4 months ago Leg swelling   [facility] [place], [place], PA-C   8 months ago [place] Ostwalt, [place], PA-C   1 year ago Bright red rectal bleeding   [facility] Birdie Sons, MD

## 2022-10-02 ENCOUNTER — Institutional Professional Consult (permissible substitution): Payer: 59 | Admitting: Student in an Organized Health Care Education/Training Program

## 2022-10-03 ENCOUNTER — Other Ambulatory Visit: Payer: Self-pay | Admitting: Physician Assistant

## 2022-10-03 DIAGNOSIS — G8929 Other chronic pain: Secondary | ICD-10-CM

## 2022-10-05 NOTE — Telephone Encounter (Signed)
Requested Prescriptions  Pending Prescriptions Disp Refills   gabapentin (NEURONTIN) 300 MG capsule [Pharmacy Med Name: GABAPENTIN 300 MG CAPSULE] 270 capsule 0    Sig: TAKE 1 CAPSULE BY MOUTH THREE TIMES A DAY     Neurology: Anticonvulsants - gabapentin Passed - 10/03/2022  5:26 PM      Passed - Cr in normal range and within 360 days    Creatinine, Ser  Date Value Ref Range Status  07/19/2022 0.60 0.44 - 1.00 mg/dL Final   Creatinine, POC  Date Value Ref Range Status  08/06/2017 NA mg/dL Final         Passed - Completed PHQ-2 or PHQ-9 in the last 360 days      Passed - Valid encounter within last 12 months    Recent Outpatient Visits           2 months ago [place], [place], PA-C   3 months ago Acute sinusitis, recurrence not specified, unspecified location   [place], DO   5 months ago Leg swelling   [facility] [place], [place], PA-C   8 months ago [place] Ostwalt, [place], PA-C   1 year ago Bright red rectal bleeding   [facility] Birdie Sons, MD

## 2022-10-25 ENCOUNTER — Emergency Department
Admission: EM | Admit: 2022-10-25 | Discharge: 2022-10-25 | Disposition: A | Discharge status: Home / Self Care | Payer: 59 | Attending: Emergency Medicine | Admitting: Emergency Medicine

## 2022-10-25 ENCOUNTER — Other Ambulatory Visit: Payer: Self-pay

## 2022-10-25 ENCOUNTER — Encounter: Payer: Self-pay | Admitting: Emergency Medicine

## 2022-10-25 ENCOUNTER — Telehealth: Payer: 59 | Admitting: Physician Assistant

## 2022-10-25 ENCOUNTER — Emergency Department: Payer: 59

## 2022-10-25 ENCOUNTER — Other Ambulatory Visit: Payer: Self-pay | Admitting: Physician Assistant

## 2022-10-25 DIAGNOSIS — J069 Acute upper respiratory infection, unspecified: Secondary | ICD-10-CM | POA: Insufficient documentation

## 2022-10-25 DIAGNOSIS — J121 Respiratory syncytial virus pneumonia: Secondary | ICD-10-CM

## 2022-10-25 DIAGNOSIS — Z20822 Contact with and (suspected) exposure to covid-19: Secondary | ICD-10-CM | POA: Diagnosis not present

## 2022-10-25 DIAGNOSIS — G4700 Insomnia, unspecified: Secondary | ICD-10-CM

## 2022-10-25 DIAGNOSIS — R059 Cough, unspecified: Secondary | ICD-10-CM | POA: Diagnosis present

## 2022-10-25 LAB — RESP PANEL BY RT-PCR (FLU A&B, COVID) ARPGX2
Influenza A by PCR: NEGATIVE
Influenza B by PCR: NEGATIVE
SARS Coronavirus 2 by RT PCR: NEGATIVE

## 2022-10-25 MED ORDER — IPRATROPIUM-ALBUTEROL 0.5-2.5 (3) MG/3ML IN SOLN
3.0000 mL | Freq: Once | RESPIRATORY_TRACT | Status: AC
  Administered 2022-10-25: 3 mL via RESPIRATORY_TRACT
  Filled 2022-10-25: qty 3

## 2022-10-25 NOTE — Progress Notes (Signed)
Because you are not improving despite treatments, I feel your condition warrants further evaluation and I recommend that you be seen in a face to face visit. You may warrant a repeat CXR to see if the pneumonia is worsening.   NOTE: There will be NO CHARGE for this eVisit   If you are having a true medical emergency please call 911.      For an urgent face to face visit, [place] has seven urgent care centers for your convenience:     [place] [facility] [place] [facility] [place] Get Driving Directions [phone] [place], [place] [address]    [place] [facility]) Get Driving Directions [phone] [place], [place] [address]  [place] [facility] [place] ([place]) Get Driving Directions [phone] [address] [place],  [place]  [address]  [place] [facility] at [address] Get Driving Directions  [phone] [address] [place],  [place] [address]   [place] [facility] Get Driving Directions [phone] [address] [place], [place], [place] [address]   [place] [facility] Get Driving Directions  [phone] [address] [place], [place] [address]   [place] [facility] at  Get Driving Directions [phone] [address], [place], [place] [address]  Your MyChart E-visit questionnaire answers were reviewed by a board certified advanced clinical practitioner to complete your personal care plan based on your specific symptoms.  Thank you for using e-Visits.   I have spent 5 minutes in review of e-visit questionnaire, review and updating patient chart, medical decision making and response to patient.   Mar Daring, PA-C

## 2022-10-25 NOTE — ED Triage Notes (Signed)
PT states she has had a cough, body aches and fever for the last several days. Pt tested positive for RSV, but negative for flu and covid. Pt states cough is productive and yellow/green in color. [facility]. Pt states she is not feeling better and wants to be reevaluated. Pt states she is on prednisone. 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

## 2022-10-25 NOTE — ED Provider Notes (Signed)
   [facility] Provider Note    Event Date/Time   First MD Initiated Contact with Patient 10/25/22 1105     (approximate)   History   Cough   HPI  Kelly Matthews is a 56 y.o. female who reports that she was diagnosed with RSV 2 days ago.  She reports she has been taking 10 mg of prednisone and doxycycline but still feels ill.  She continues to have a cough.  No fever reported.  She also reports body aches.  She does work in a daycare     Physical Exam   Triage Vital Signs: ED Triage Vitals  Enc Vitals Group     BP 10/25/22 1031 (!) 150/83     Pulse Rate 10/25/22 1031 91     Resp 10/25/22 1031 18     Temp 10/25/22 1031 98.4 F (36.9 C)     Temp Source 10/25/22 1031 Oral     SpO2 10/25/22 1031 96 %     Weight 10/25/22 1031 94.8 kg (209 lb)     Height 10/25/22 1031 1.524 m (5')     Head Circumference --      Peak Flow --      Pain Score 10/25/22 1037 9     Pain Loc --      Pain Edu? --      Excl. in [place]? --     Most recent vital signs: Vitals:   10/25/22 1031  BP: (!) 150/83  Pulse: 91  Resp: 18  Temp: 98.4 F (36.9 C)  SpO2: 96%     General: Awake, no distress.  CV:  Good peripheral perfusion.  Resp:  Normal effort.  Coarse breath sounds, very mild wheezing Abd:  No distention.  Other:     ED Results / Procedures / Treatments   Labs (all labs ordered are listed, but only abnormal results are displayed) Labs Reviewed  RESP PANEL BY RT-PCR (FLU A&B, COVID) [id number]     EKG     RADIOLOGY Chest x-ray viewed interpreted by me, no pneumonia    PROCEDURES:  Critical Care performed:   Procedures   MEDICATIONS ORDERED IN ED: Medications  ipratropium-albuterol (DUONEB) 0.5-2.5 (3) MG/3ML nebulizer solution 3 mL (3 mLs Nebulization Given 10/25/22 1130)  ipratropium-albuterol (DUONEB) 0.5-2.5 (3) MG/3ML nebulizer solution 3 mL (3 mLs Nebulization Given 10/25/22 1130)     IMPRESSION / MDM / ASSESSMENT AND PLAN  / ED COURSE  I reviewed the triage vital signs and the nursing notes. Patient's presentation is most consistent with acute complicated illness / injury requiring diagnostic workup.   Patient presents with cough, body aches as above.  Suspicious for viral illness, likely RSV, possible pneumonia, possible COVID/flu.  Will give DuoNebs, obtain chest x-ray, PCR swab reevaluate.  Chest x-ray is negative for pneumonia, patient feeling better after DuoNeb's, PCR negative for RSV flu COVID, recommend continued supportive care      FINAL CLINICAL IMPRESSION(S) / ED DIAGNOSES   Final diagnoses:  Viral URI with cough     Rx / DC Orders   ED Discharge Orders     None        Note:  This document was prepared using Dragon voice recognition software and may include unintentional dictation errors.   [place] Drafts, MD 10/25/22 1233

## 2022-10-26 ENCOUNTER — Telehealth: Payer: Self-pay

## 2022-10-26 NOTE — Telephone Encounter (Signed)
Transition Care Management Unsuccessful Follow-up Telephone Call  Date of discharge and from where:  [facility] ER 10/25/22  Attempts:  1st Attempt  Reason for unsuccessful TCM follow-up call:  Left voice message

## 2022-11-06 ENCOUNTER — Encounter: Payer: Self-pay | Admitting: Internal Medicine

## 2022-11-06 ENCOUNTER — Ambulatory Visit (INDEPENDENT_AMBULATORY_CARE_PROVIDER_SITE_OTHER): Payer: 59 | Admitting: Internal Medicine

## 2022-11-06 ENCOUNTER — Other Ambulatory Visit
Admission: RE | Admit: 2022-11-06 | Discharge: 2022-11-06 | Disposition: A | Discharge status: Home / Self Care | Payer: 59 | Source: Ambulatory Visit | Attending: Internal Medicine | Admitting: Internal Medicine

## 2022-11-06 VITALS — BP 138/70 | HR 97 | Temp 98.0°F | Ht 60.0 in | Wt 212.0 lb

## 2022-11-06 DIAGNOSIS — R058 Other specified cough: Secondary | ICD-10-CM | POA: Diagnosis present

## 2022-11-06 MED ORDER — AIRSUPRA 90-80 MCG/ACT IN AERO: 10.7000 g | INHALATION_SPRAY | Freq: Two times a day (BID) | RESPIRATORY_TRACT | 0 refills | Status: DC

## 2022-11-06 MED ORDER — CEFDINIR 300 MG PO CAPS: 300.0000 mg | ORAL_CAPSULE | Freq: Two times a day (BID) | ORAL | 0 refills | Status: DC

## 2022-11-06 MED ORDER — AIRSUPRA 90-80 MCG/ACT IN AERO
2.0000 | INHALATION_SPRAY | RESPIRATORY_TRACT | 0 refills | Status: DC | PRN
Start: 2022-11-06 — End: 2022-12-10

## 2022-11-06 NOTE — Assessment & Plan Note (Addendum)
Onset was in her 30's typically triggered by URI  - Quant Ig's >>> - Sinus Ct p 20 days abx >>>  Upper airway cough syndrome (previously labeled PNDS),  is so named because it's frequently impossible to sort out how much is  CR/sinusitis with freq throat clearing (which can be related to primary GERD)   vs  causing  secondary (" extra esophageal")  GERD from wide swings in gastric pressure that occur with throat clearing, often  promoting self use of mint and menthol lozenges that reduce the lower esophageal sphincter tone and exacerbate the problem further in a cyclical fashion.  In fact, of the three most common causes of  Sub-acute / recurrent or chronic cough, only one (GERD)  can actually contribute to/ trigger  the other two (asthma and post nasal drip syndrome)  and perpetuate the cylce of cough.  While not intuitively obvious, many patients with chronic low grade reflux do not cough until there is a primary insult that disturbs the protective epithelial barrier and exposes sensitive nerve endings.   This is typically viral but can due to PNDS (underlying chronic sinusitis)  and  either or both may apply here.     >>>  The point is that once this occurs, it is difficult to eliminate the cycle  using anything but a maximally effective acid suppression regimen at least in the short run, accompanied by an appropriate diet to address non acid GERD and control / eliminate the cough itself with mucinex dm 1200 mg every 12 hours and also added air supra up to 2 puffs every 4 hours   in case of component of Th-2 driven upper or lower airways inflammation (if cough responds short term only to relapse p stops the air supra  while will on full rx for uacs (as above), then  that would point to allergic rhinitis/ asthma or eos bronchitis as alternative dx)  and w/u for underlying sinus dz with sinus cT p 20 d abx and check Quant Ig's for any signs of immunodeficiency (w/u by [place] if any further question about  immune status)   11/06/2022  After extensive coaching inhaler device,  effectiveness =    75%   Discussed in detail all the  indications, usual  risks and alternatives  relative to the benefits with patient who agrees to proceed with Rx as outlined.      F/u in 6 weeks, sooner prn          Each maintenance medication was reviewed in detail including emphasizing most importantly the difference between maintenance and prns and under what circumstances the prns are to be triggered using an action plan format where appropriate.  Total time for H and P, chart review, counseling, reviewing hfa device(s) and generating customized AVS unique to this office visit / same day charting  > 45 min with pt new to me

## 2022-11-06 NOTE — Patient Instructions (Addendum)
For cough mucinex dm 1200 mg every 12 hours as needed   Try prilosec (omeprazole) otc  Take x 2  30- 60 min before your first and last meals of the day until without the need for cough suppression  GERD (REFLUX)  is an extremely common cause of respiratory symptoms just like yours , many times with no obvious heartburn at all.    It can be treated with medication, but also with lifestyle changes including elevation of the head of your bed (ideally with 6 -8inch blocks under the headboard of your bed),  Smoking cessation, avoidance of late meals, excessive alcohol, and avoid fatty foods, chocolate, peppermint, colas, red wine, and acidic juices such as orange juice.  NO MINT OR MENTHOL PRODUCTS SO NO COUGH DROPS  USE SUGARLESS CANDY INSTEAD (Jolley ranchers or Stover's or Life Savers) or even ice chips will also do - the key is to swallow to prevent all throat clearing. NO OIL BASED VITAMINS - use powdered substitutes.  Avoid fish oil when coughing.   Omnicef 300 mg twice daily x 10 days   My office will be contacting you by phone for referral to Sinus CT after 10 more days of antibiotics  - if you don't hear back from my office within one week please call us back or notify us thru MyChart and we'll address it right away.   Please remember to go to the lab department   for your tests - we will call you with the results when they are available.     For wheezing/ short of breath, bad cough > try air supra up 2 puffs every 4 hours as needed   Please schedule a follow up office visit in 6 weeks, call sooner if needed

## 2022-11-06 NOTE — Progress Notes (Signed)
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           History of Present Illness  11/06/2022  Pulmonary/ 1st office eval/ Kelly Matthews / [facility]  Chief Complaint  Patient presents with   pulmonary consult    C/o prod cough with green mucus, wheezing and SOB with exertion. RVS and PNA 2wk ago.  Dyspnea:  mostly due to clogged up head / never seen by ENT / allergist  Cough: worse when head hit pillow  Sleep: flat bed big pillow SABA use:  has inhalers in past /ventolin listed   Already on prilosec 20 mg bid but not ac   No other obvious day to day or daytime pattern/variability or assoc mucus plugs or hemoptysis or cp or chest tightness, subjective wheeze or overt sinus or hb symptoms.   Sleeping without nocturnal  or early am exacerbation  of respiratory  c/o's or need for noct saba. Also denies any obvious fluctuation of symptoms with weather or environmental changes or other aggravating or alleviating factors except as outlined above   No unusual exposure hx or h/o childhood pna/ asthma or knowledge of premature birth.  Current Allergies, Complete Past Medical History, Past Surgical History, Family History, and Social History were reviewed in [employer] record.  ROS  The following are not active complaints unless bolded Hoarseness, sore throat, dysphagia, dental problems, itching, sneezing,  nasal congestion or discharge of excess mucus or purulent secretions, ear ache,   fever, chills, sweats, unintended wt loss  or wt gain, classically pleuritic or exertional cp,  orthopnea pnd or arm/hand swelling  or leg swelling, presyncope, palpitations, abdominal pain, anorexia, nausea, vomiting, diarrhea  or change in bowel habits or change in bladder habits, change in stools or change in urine, dysuria, hematuria,  rash, arthralgias, visual complaints, headache, numbness, weakness or ataxia or problems with walking or coordination,  change in mood or  memory.             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   Diabetes mellitus without complication (HCC)    Fatty liver    Hypertension    Psoriasis (a type of skin inflammation)    Tachycardia     Outpatient Medications Prior to Visit  Medication Sig Dispense Refill   atorvastatin (LIPITOR) 10 MG tablet Take 10 mg by mouth daily.     Continuous Blood Gluc Receiver (DEXCOM G6 RECEIVER) DEVI USE FOR CONTINUOUS BLOOD GLUCOSE MONITORING     Continuous Blood Gluc Sensor (DEXCOM G6 SENSOR) MISC USE FOR CONTINUOUS BLOOD GLUCOSE MONITORING REPLACE SENSOR EVERY 10 DAYS     Continuous Blood Gluc Transmit (DEXCOM G6 TRANSMITTER) MISC USE FOR CONTINUOUS BLOOD GLUCOSE MONITORING REPLACE TRANSMITTER EVERY 90 DAYS     Dapagliflozin-Metformin HCl ER 03-999 MG TB24 Take by mouth.     gabapentin (NEURONTIN) 300 MG capsule TAKE 1 CAPSULE BY MOUTH THREE TIMES A DAY 270 capsule  0   Lancets (ACCU-CHEK SOFT TOUCH) lancets Use as instructed 300 each 3   meloxicam (MOBIC) 15 MG tablet TAKE 1 TABLET (15 MG TOTAL) BY MOUTH DAILY. 90 tablet 0   omeprazole (PRILOSEC) 20 MG capsule Take 1 capsule (20 mg total) by mouth daily. 30 capsule 3   sertraline (ZOLOFT) 50 MG tablet TAKE 1 TABLET BY MOUTH EVERY DAY 90 tablet 2   traZODone (DESYREL) 50 MG tablet TAKE 1-2 TABLETS (50-100 MG TOTAL) BY MOUTH AT BEDTIME AS NEEDED (FOR INSOMNIA). FOR SLEEP 180 tablet 1   albuterol (VENTOLIN HFA) 108 (90 Base) MCG/ACT inhaler Inhale into the lungs.     ipratropium  (ATROVENT) 0.03 % nasal spray Place into both nostrils.     No facility-administered medications prior to visit.     Objective:     BP 138/70 (BP Location: Left Arm, Cuff Size: Large)   Pulse 97   Temp 98 F (36.7 C) (Temporal)   Ht 5' (1.524 m)   Wt 212 lb (96.2 kg)   LMP 04/22/2017 Comment: urine pregnancy test negative  SpO2 98%   BMI 41.40 kg/m   SpO2: 98 %  Amb MO (by BMI ) pleasant wf , no spont coughing   HEENT : Oropharynx  clear      Nasal turbinates mild non specific turbinate edema   NECK :  without  apparent JVD/ palpable Nodes/TM    LUNGS: no acc muscle use,  Nl contour chest which is clear to A and P bilaterally without cough on insp or exp maneuvers   CV:  RRR  no s3 or murmur or increase in P2, and no edema   ABD: quite obese but soft and nontender with nl inspiratory excursion in the supine position. No bruits or organomegaly appreciated   MS:  Nl gait/ ext warm without deformities Or obvious joint restrictions  calf tenderness, cyanosis or clubbing    SKIN: warm and dry without lesions    NEURO:  alert, approp, nl sensorium with  no motor or cerebellar deficits apparent.    I personally reviewed images and agree with radiology impression as follows:  CXR:   pa and lateral 10/25/22  Wnl  CTa  07/19/22 no bronchiectasis     Assessment   Upper airway cough syndrome vs asthmatic bronchitis Onset was in her 30's typically triggered by URI  - Quant Ig's >>> - Sinus Ct p 20 days abx >>>  Upper airway cough syndrome (previously labeled PNDS),  is so named because it's frequently impossible to sort out how much is  CR/sinusitis with freq throat clearing (which can be related to primary GERD)   vs  causing  secondary (" extra esophageal")  GERD from wide swings in gastric pressure that occur with throat clearing, often  promoting self use of mint and menthol lozenges that reduce the lower esophageal sphincter tone and exacerbate the problem further in a  cyclical fashion.  In fact, of the three most common causes of  Sub-acute / recurrent or chronic cough, only one (GERD)  can actually contribute to/ trigger  the other two (asthma and post nasal drip syndrome)  and perpetuate the cylce of cough.  While not intuitively obvious, many patients with chronic low grade reflux do not cough until there is a primary insult that disturbs the protective epithelial barrier and exposes sensitive nerve endings.   This is typically viral but can due to PNDS (underlying chronic sinusitis)  and  either or both may  apply here.     >>>  The point is that once this occurs, it is difficult to eliminate the cycle  using anything but a maximally effective acid suppression regimen at least in the short run, accompanied by an appropriate diet to address non acid GERD and control / eliminate the cough itself with mucinex dm 1200 mg every 12 hours and also added air supra up to 2 puffs every 4 hours   in case of component of Th-2 driven upper or lower airways inflammation (if cough responds short term only to relapse p stops the air supra  while will on full rx for uacs (as above), then  that would point to allergic rhinitis/ asthma or eos bronchitis as alternative dx)  and w/u for underlying sinus dz with sinus cT p 20 d abx and check Quant Ig's for any signs of immunodeficiency (w/u by Kozlow if any further question about immune status)   11/06/2022  After extensive coaching inhaler device,  effectiveness =    75%   Discussed in detail all the  indications, usual  risks and alternatives  relative to the benefits with patient who agrees to proceed with Rx as outlined.      F/u in 6 weeks, sooner prn          Each maintenance medication was reviewed in detail including emphasizing most importantly the difference between maintenance and prns and under what circumstances the prns are to be triggered using an action plan format where appropriate.  Total time for H and P, chart  review, counseling, reviewing hfa device(s) and generating customized AVS unique to this office visit / same day charting  > 45 min with pt new to me           Christinia Gully, MD 11/06/2022

## 2022-11-08 LAB — IGG, IGA, IGM
IgA: 229 mg/dL (ref 87–352)
IgG (Immunoglobin G), Serum: 914 mg/dL (ref 586–1602)
IgM (Immunoglobulin M), Srm: 163 mg/dL (ref 26–217)

## 2022-11-08 LAB — IGE: IgE (Immunoglobulin E), Serum: 66 IU/mL (ref 6–495)

## 2022-11-10 ENCOUNTER — Other Ambulatory Visit: Payer: Self-pay | Admitting: Physician Assistant

## 2022-11-10 DIAGNOSIS — G8929 Other chronic pain: Secondary | ICD-10-CM

## 2022-11-13 ENCOUNTER — Encounter: Payer: Self-pay | Admitting: Internal Medicine

## 2022-11-13 NOTE — Telephone Encounter (Signed)
Dr. Melvyn Novas, please advise. Attachment included.

## 2022-11-19 ENCOUNTER — Telehealth: Payer: Self-pay | Admitting: Internal Medicine

## 2022-11-19 NOTE — Telephone Encounter (Signed)
Patient sent FMLA form through MyChart for Dr. Melvyn Novas to complete.  I have called and left a voice message for the patient.  Before I can complete the form I will need to get additional information regarding FMLA start date, time out of work, work restrictions, return to work date, Social research officer, government.   I left her my phone number to call me back directly.

## 2022-11-23 NOTE — Telephone Encounter (Signed)
Darilyn please advise. Thanks

## 2022-11-25 DIAGNOSIS — Z0289 Encounter for other administrative examinations: Secondary | ICD-10-CM

## 2022-11-25 NOTE — Telephone Encounter (Signed)
Patient called back and we reviewed the FMLA form - her start date is 11/06/2022 and end date will be 12 months.  She does not need any work restrictions.  I completed the form and sent to Dr. Melvyn Novas for signature.

## 2022-11-27 NOTE — Telephone Encounter (Signed)
Signed FMLA form was faxed to [employer] fax# [phone].   Hard copy was sent to patient via MyChart.

## 2022-11-30 ENCOUNTER — Other Ambulatory Visit: Payer: 59

## 2022-12-01 ENCOUNTER — Ambulatory Visit
Admission: RE | Admit: 2022-12-01 | Discharge: 2022-12-01 | Disposition: A | Discharge status: Home / Self Care | Payer: 59 | Source: Ambulatory Visit | Attending: Internal Medicine | Admitting: Internal Medicine

## 2022-12-01 DIAGNOSIS — J329 Chronic sinusitis, unspecified: Secondary | ICD-10-CM | POA: Diagnosis not present

## 2022-12-01 DIAGNOSIS — R058 Other specified cough: Secondary | ICD-10-CM

## 2022-12-04 ENCOUNTER — Telehealth: Payer: Self-pay | Admitting: Internal Medicine

## 2022-12-04 MED ORDER — AZITHROMYCIN 250 MG PO TABS: ORAL_TABLET | ORAL | 0 refills | Status: DC

## 2022-12-04 NOTE — Telephone Encounter (Signed)
Zpak - pt needs f/u ov to sort out why keeps getting sick

## 2022-12-04 NOTE — Telephone Encounter (Signed)
Spoke with patient in regards to CT advised of results. Pt also states since Tuesday night she had sinus pain, headache, cough, and a scratchy throat. Pt is requesting some a antibiotic or steroid for symptoms.   Dr. Melvyn Novas please advise  Pharmacy [facility]

## 2022-12-04 NOTE — Telephone Encounter (Signed)
Spoke with pt and reviewed Dr. Gustavus Bryant recommendations. Pt stated understanding and verified pharmacy. Medication order sent in. Nothing further needed at this time.

## 2022-12-08 ENCOUNTER — Other Ambulatory Visit: Payer: Self-pay

## 2022-12-08 ENCOUNTER — Emergency Department
Admission: EM | Admit: 2022-12-08 | Discharge: 2022-12-08 | Disposition: A | Discharge status: Home / Self Care | Payer: 59 | Attending: Emergency Medicine | Admitting: Emergency Medicine

## 2022-12-08 ENCOUNTER — Encounter: Payer: Self-pay | Admitting: Emergency Medicine

## 2022-12-08 ENCOUNTER — Emergency Department: Payer: 59

## 2022-12-08 DIAGNOSIS — R059 Cough, unspecified: Secondary | ICD-10-CM | POA: Diagnosis not present

## 2022-12-08 DIAGNOSIS — B9789 Other viral agents as the cause of diseases classified elsewhere: Secondary | ICD-10-CM | POA: Insufficient documentation

## 2022-12-08 DIAGNOSIS — Z20822 Contact with and (suspected) exposure to covid-19: Secondary | ICD-10-CM | POA: Diagnosis not present

## 2022-12-08 DIAGNOSIS — J069 Acute upper respiratory infection, unspecified: Secondary | ICD-10-CM

## 2022-12-08 DIAGNOSIS — I10 Essential (primary) hypertension: Secondary | ICD-10-CM | POA: Insufficient documentation

## 2022-12-08 DIAGNOSIS — R519 Headache, unspecified: Secondary | ICD-10-CM | POA: Diagnosis present

## 2022-12-08 DIAGNOSIS — R509 Fever, unspecified: Secondary | ICD-10-CM | POA: Diagnosis not present

## 2022-12-08 LAB — RESP PANEL BY RT-PCR (RSV, FLU A&B, COVID)  RVPGX2
Influenza A by PCR: NEGATIVE
Influenza B by PCR: NEGATIVE
Resp Syncytial Virus by PCR: NEGATIVE
SARS Coronavirus 2 by RT PCR: NEGATIVE

## 2022-12-08 MED ORDER — PSEUDOEPH-BROMPHEN-DM 30-2-10 MG/5ML PO SYRP: 5.0000 mL | ORAL_SOLUTION | Freq: Four times a day (QID) | ORAL | 0 refills | Status: DC | PRN

## 2022-12-08 NOTE — Discharge Instructions (Addendum)
With your primary care provider if any continued problems.  Also continue seeing your pulmonologist as scheduled.  A prescription for Bromfed-DM was sent to the pharmacy to help with cough and congestion.  Increase fluids to stay hydrated.  Tylenol or ibuprofen if needed for body aches, headache, fever.  Return to the emergency department if any severe worsening of your symptoms.

## 2022-12-08 NOTE — ED Triage Notes (Signed)
Pt via POV from home. States that she has been having cough, congestion, headache, and fever for the past week. Reports fever of 100.4 and states she took Tylenol around 7:30am this AM. States that she had RSV during christ with PNA. Denies hx of COPD/CHF. Pt is A&Ox4 and NAD

## 2022-12-08 NOTE — ED Provider Notes (Signed)
[facility] Provider Note    Event Date/Time   First MD Initiated Contact with Patient 12/08/22 1124     (approximate)   History   Headache   HPI  Kelly Matthews is a 57 y.o. female   presents to the ED with complaint of cough, congestion, headache and fever for the past week.  Patient states her temperature has been 100.4 at home and she took Tylenol at approximately 7:30 AM today.  Patient reports that she had RSV around Christmas time and also was diagnosed with pneumonia.  She currently has an appointment for follow-up with pulmonology for her breathing and reports that she is out on FLMA until she can be evaluated.  Patient has a history of hypertension, prediabetes, history of SVT, migraines, chronic low back pain with left-sided sciatica and upper respiratory cough syndrome versus asthmatic bronchitis.      Physical Exam   Triage Vital Signs: ED Triage Vitals  Enc Vitals Group     BP 12/08/22 1003 (!) 140/75     Pulse Rate 12/08/22 1003 95     Resp 12/08/22 1003 18     Temp 12/08/22 1003 98.1 F (36.7 C)     Temp Source 12/08/22 1003 Oral     SpO2 12/08/22 1003 94 %     Weight 12/08/22 1004 210 lb (95.3 kg)     Height 12/08/22 1004 5' (1.524 m)     Head Circumference --      Peak Flow --      Pain Score 12/08/22 1004 0     Pain Loc --      Pain Edu? --      Excl. in [place]? --     Most recent vital signs: Vitals:   12/08/22 1300 12/08/22 1302  BP: 111/70 111/70  Pulse: 93 90  Resp:  18  Temp:    SpO2: 98% 97%     General: Awake, no distress.  Alert, talkative, able to talk in complete sentences without any difficulty breathing. CV:  Good peripheral perfusion.  Heart regular rate and rhythm. Resp:  Normal effort.  Lungs are clear bilaterally no wheezes are noted at this time. Abd:  No distention.  Other:     ED Results / Procedures / Treatments   Labs (all labs ordered are listed, but only abnormal results are  displayed) Labs Reviewed  RESP PANEL BY RT-PCR (RSV, FLU A&B, COVID)  [id number]     RADIOLOGY Chest x-ray images reviewed and interpreted by myself independent of the radiologist with no acute changes or suspicion for infiltrates.  Official radiology report is negative for active cardiopulmonary disease.    PROCEDURES:  Critical Care performed:   Procedures   MEDICATIONS ORDERED IN ED: Medications - No data to display   IMPRESSION / MDM / [place] / ED COURSE  I reviewed the triage vital signs and the nursing notes.   Differential diagnosis includes, but is not limited to, COVID, influenza, RSV, pneumonia, bronchitis, viral upper respiratory infection.  57 year old female presents to the ED with complaint of upper respiratory symptoms and fever for the last several days.  Patient also was seen at Christmas time for RSV and pneumonia and is currently being followed by pulmonology.  Respiratory panel was reassuring with COVID, influenza and RSV being negative.  Chest x-ray did not show any evidence of pneumonia.  A prescription for Bromfed-DM was sent to the pharmacy for her to take as needed for  cough and congestion.  She will continue with her follow-up appointments with pulmonology.  She is aware that she may return to the emergency department if any severe worsening of her symptoms.      Patient's presentation is most consistent with acute complicated illness / injury requiring diagnostic workup.  FINAL CLINICAL IMPRESSION(S) / ED DIAGNOSES   Final diagnoses:  Viral URI with cough     Rx / DC Orders   ED Discharge Orders          Ordered    brompheniramine-pseudoephedrine-DM 30-2-10 MG/5ML syrup  4 times daily PRN        12/08/22 1253             Note:  This document was prepared using Dragon voice recognition software and may include unintentional dictation errors.   Johnn Hai, PA-C 12/08/22 1429    Harvest Dark, MD 12/08/22  1455

## 2022-12-10 ENCOUNTER — Encounter: Payer: Self-pay | Admitting: Student in an Organized Health Care Education/Training Program

## 2022-12-10 ENCOUNTER — Telehealth: Payer: Self-pay | Admitting: Student in an Organized Health Care Education/Training Program

## 2022-12-10 ENCOUNTER — Ambulatory Visit (INDEPENDENT_AMBULATORY_CARE_PROVIDER_SITE_OTHER): Payer: 59 | Admitting: Student in an Organized Health Care Education/Training Program

## 2022-12-10 VITALS — BP 128/70 | HR 100 | Temp 98.8°F | Ht 60.0 in | Wt 213.8 lb

## 2022-12-10 DIAGNOSIS — R058 Other specified cough: Secondary | ICD-10-CM

## 2022-12-10 DIAGNOSIS — J069 Acute upper respiratory infection, unspecified: Secondary | ICD-10-CM

## 2022-12-10 MED ORDER — AIRSUPRA 90-80 MCG/ACT IN AERO: 2.0000 | INHALATION_SPRAY | RESPIRATORY_TRACT | 2 refills | Status: DC | PRN

## 2022-12-10 NOTE — Telephone Encounter (Signed)
Wonders if we can see her sooner than her 4:00 appt today.  Went to ER on Sunday. Feels worse and coughing much harder. Harder than ever, she says. Wheezing. Green mucous. 100 fever this AM. Also, SOB when coughing.  Pls call @ [phone] to advise.

## 2022-12-10 NOTE — Progress Notes (Signed)
Synopsis: Sick Visit  Assessment & Plan:   1. Upper airway cough syndrome 2. URTI (acute upper respiratory infection)  Presents for the evaluation of an URTI. No wheezing noted on exam and given her history of diabetes, I will  defer on sending a script for any steroids. CXR was normal. It is my impresion that she has a viral URTI for which conservative management is recommended. Patient is requesting a refill for her SABA/ICS and a letter for work. She will follow up with Dr. Melvyn Novas.  - Albuterol-Budesonide (AIRSUPRA) 90-80 MCG/ACT AERO; Inhale 2 puffs into the lungs every 4 (four) hours as needed.  Dispense: 10.7 g; Refill: 2   No follow-ups on file.  I spent 25 minutes caring for this patient today, including preparing to see the patient, obtaining a medical history , reviewing a separately obtained history, performing a medically appropriate examination and/or evaluation, counseling and educating the patient/family/caregiver, ordering medications, tests, or procedures, documenting clinical information in the electronic health record, and independently interpreting results (not separately reported/billed) and communicating results to the patient/family/caregiver  Armando Reichert, MD [facility] 12/10/2022 5:55 PM    End of visit medications:  Meds ordered this encounter  Medications   DISCONTD: Albuterol-Budesonide (AIRSUPRA) 90-80 MCG/ACT AERO    Sig: Inhale 2 puffs into the lungs every 4 (four) hours as needed.    Dispense:  10.7 g    Refill:  2    Order Specific Question:   Lot Number?    Answer:   [id number]    Order Specific Question:   Expiration Date?    Answer:   12/18/2023    Order Specific Question:   Manufacturer?    Answer:   [employer] [[id number]]    Order Specific Question:   Quantity    Answer:   1   Albuterol-Budesonide (AIRSUPRA) 90-80 MCG/ACT AERO    Sig: Inhale 2 puffs into the lungs every 4 (four) hours as needed.    Dispense:  10.7 g     Refill:  2    Order Specific Question:   Lot Number?    Answer:   [id number]    Order Specific Question:   Expiration Date?    Answer:   12/18/2023    Order Specific Question:   Manufacturer?    Answer:   [employer] [[id number]]    Order Specific Question:   Quantity    Answer:   1     Current Outpatient Medications:    atorvastatin (LIPITOR) 10 MG tablet, Take 10 mg by mouth daily., Disp: , Rfl:    Continuous Blood Gluc Receiver (DEXCOM G6 RECEIVER) DEVI, USE FOR CONTINUOUS BLOOD GLUCOSE MONITORING, Disp: , Rfl:    Continuous Blood Gluc Transmit (DEXCOM G6 TRANSMITTER) MISC, USE FOR CONTINUOUS BLOOD GLUCOSE MONITORING REPLACE TRANSMITTER EVERY 90 DAYS, Disp: , Rfl:    Dapagliflozin-Metformin HCl ER 03-999 MG TB24, Take by mouth., Disp: , Rfl:    gabapentin (NEURONTIN) 300 MG capsule, TAKE 1 CAPSULE BY MOUTH THREE TIMES A DAY, Disp: 270 capsule, Rfl: 0   Lancets (ACCU-CHEK SOFT TOUCH) lancets, Use as instructed, Disp: 300 each, Rfl: 3   meloxicam (MOBIC) 15 MG tablet, TAKE 1 TABLET (15 MG TOTAL) BY MOUTH DAILY., Disp: 90 tablet, Rfl: 0   omeprazole (PRILOSEC) 20 MG capsule, Take 1 capsule (20 mg total) by mouth daily., Disp: 30 capsule, Rfl: 3   sertraline (ZOLOFT) 50 MG tablet, TAKE 1 TABLET BY MOUTH EVERY DAY, Disp: 90 tablet, Rfl: 2  traZODone (DESYREL) 50 MG tablet, TAKE 1-2 TABLETS (50-100 MG TOTAL) BY MOUTH AT BEDTIME AS NEEDED (FOR INSOMNIA). FOR SLEEP, Disp: 180 tablet, Rfl: 1   Albuterol-Budesonide (AIRSUPRA) 90-80 MCG/ACT AERO, Inhale 2 puffs into the lungs every 4 (four) hours as needed., Disp: 10.7 g, Rfl: 2   brompheniramine-pseudoephedrine-DM 30-2-10 MG/5ML syrup, Take 5 mLs by mouth 4 (four) times daily as needed. (Patient not taking: Reported on 12/10/2022), Disp: 120 mL, Rfl: 0   Continuous Blood Gluc Sensor (DEXCOM G6 SENSOR) MISC, USE FOR CONTINUOUS BLOOD GLUCOSE MONITORING REPLACE SENSOR EVERY 10 DAYS, Disp: , Rfl:    Subjective:   PATIENT ID: Kelly Matthews GENDER:  female DOB: 01/31/1966, MRN: [id number]  Chief Complaint  Patient presents with   Follow-up    Seen at ED 12/08/21- completed zpak today that was prescribed 1/19. SOB with exertion, prod cough with green sputum, wheezing and temp of 100.     HPI  57 year old female presenting to clinic for an acute visit.  She was previously seen by Dr. Melvyn Novas for the evaluation of cough. Workup was initiated and she was started on SABA/ICS as needed. 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  She is presenting now with symptoms of an URTI. She reports cough, nasal congestion, fevers, and chills. She has received a course of azithromycin with minimal improvement. She was seen in Urgent care on 12/08/2022 where an Xray was within normal. Viral testing (Flu/RSV/COVID) was negative.  Ancillary information including prior medications, full medical/surgical/family/social histories, and PFTs (when available) are listed below and have been reviewed.   Review of Systems  Constitutional:  Positive for chills, fever and malaise/fatigue.  Respiratory:  Positive for cough and sputum production. Negative for shortness of breath and wheezing.   Cardiovascular:  Negative for chest pain.     Objective:   Vitals:   12/10/22 1544  BP: 128/70  Pulse: 100  Temp: 98.8 F (37.1 C)  TempSrc: Temporal  SpO2: 96%  Weight: 213 lb 12.8 oz (97 kg)  Height: 5' (1.524 m)   96% on RA  BMI Readings from Last 3 Encounters:  12/10/22 41.75 kg/m  12/08/22 41.01 kg/m  11/06/22 41.40 kg/m   Wt Readings from Last 3 Encounters:  12/10/22 213 lb 12.8 oz (97 kg)  12/08/22 210 lb (95.3 kg)  11/06/22 212 lb (96.2 kg)    Physical Exam Constitutional:      Appearance: She is obese. She is not ill-appearing.  Cardiovascular:     Rate and Rhythm: Normal rate and regular rhythm.     Heart sounds: Normal heart sounds.  Pulmonary:     Effort: Pulmonary effort is normal. No respiratory distress.      Breath sounds: Normal breath sounds. No wheezing or rales.  Abdominal:     Palpations: Abdomen is soft.  Musculoskeletal:     Right lower leg: No edema.     Left lower leg: No edema.  Skin:    General: Skin is warm.  Neurological:     General: No focal deficit present.     Mental Status: She is alert and oriented to person, place, and time. Mental status is at baseline.    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   Diabetes mellitus without complication (HCC)    Fatty liver    Hypertension    Psoriasis (a type of skin inflammation)    Tachycardia  Family History  Problem Relation Age of Onset   Coronary artery disease Mother    Coronary artery disease Father    Diabetes Father    Emphysema Father    Seizures Daughter    Cancer Maternal Grandmother    Breast cancer Cousin 42     Past Surgical History:  Procedure Laterality Date   ANTERIOR CERVICAL DECOMP/DISCECTOMY FUSION N/A 02/01/2017   Procedure: ANTERIOR CERVICAL DECOMPRESSION/DISCECTOMY FUSION 1 LEVEL;  Surgeon: Bayard Hugger, MD;  Location: [facility];  Service: Neurosurgery;  Laterality: N/A;   BREAST BIOPSY Left 04/18/2015   Benign calcification   CESAREAN SECTION  1990,2001   COLONOSCOPY WITH PROPOFOL N/A 06/15/2018   Procedure: COLONOSCOPY WITH PROPOFOL;  Surgeon: Virgel Manifold, MD;  Location: [facility];  Service: Endoscopy;  Laterality: N/A;   COLONOSCOPY WITH PROPOFOL N/A 08/22/2020   Procedure: COLONOSCOPY WITH PROPOFOL;  Surgeon: Virgel Manifold, MD;  Location: [facility];  Service: Endoscopy;  Laterality: N/A;   ESOPHAGOGASTRODUODENOSCOPY (EGD) WITH PROPOFOL N/A 06/15/2018   Procedure: ESOPHAGOGASTRODUODENOSCOPY (EGD) WITH PROPOFOL;  Surgeon: Virgel Manifold, MD;  Location: [facility];  Service: Endoscopy;  Laterality: N/A;   INTRAUTERINE DEVICE INSERTION  October 2015   [facility]    Social History    Socioeconomic History   Marital status: Widowed    Spouse name: Not on file   Number of children: Not on file   Years of education: Not on file   Highest education level: Not on file  Occupational History   Not on file  Tobacco Use   Smoking status: Former    Years: 1.00    Types: Cigarettes    Quit date: 01/19/1985    Years since quitting: 37.9   Smokeless tobacco: Never  Vaping Use   Vaping Use: Never used  Substance and Sexual Activity   Alcohol use: No    Alcohol/week: 0.0 standard drinks of alcohol    Comment: occasionally 1 beer every 6 months   Drug use: No   Sexual activity: Yes    Birth control/protection: I.U.D.  Other Topics Concern   Not on file  Social History Narrative   Not on file   Social Determinants of Health   Financial Resource Strain: Not on file  Food Insecurity: Not on file  Transportation Needs: Not on file  Physical Activity: Not on file  Stress: Not on file  Social Connections: Not on file  Intimate Partner Violence: Not on file     Allergies  Allergen Reactions   Oxycodone Itching   Penicillins Itching     CBC    Component Value Date/Time   WBC 7.8 07/19/2022 2206   RBC 4.63 07/19/2022 2206   HGB 12.2 07/19/2022 2206   HGB 14.0 01/07/2021 1424   HCT 37.8 07/19/2022 2206   HCT 41.5 01/07/2021 1424   PLT 202 07/19/2022 2206   PLT 175 01/07/2021 1424   MCV 81.6 07/19/2022 2206   MCV 83 01/07/2021 1424   MCH 26.3 07/19/2022 2206   MCHC 32.3 07/19/2022 2206   RDW 14.9 07/19/2022 2206   RDW 13.6 01/07/2021 1424   LYMPHSABS 2.2 07/19/2022 2206   LYMPHSABS 1.9 01/07/2021 1424   MONOABS 0.4 07/19/2022 2206   EOSABS 0.1 07/19/2022 2206   EOSABS 0.1 01/07/2021 1424   BASOSABS 0.1 07/19/2022 2206   BASOSABS 0.1 01/07/2021 1424    Pulmonary Functions Testing Results:     No data to display  Outpatient Medications Prior to Visit  Medication Sig Dispense Refill   atorvastatin (LIPITOR) 10 MG tablet Take 10 mg by  mouth daily.     Continuous Blood Gluc Receiver (DEXCOM G6 RECEIVER) DEVI USE FOR CONTINUOUS BLOOD GLUCOSE MONITORING     Continuous Blood Gluc Transmit (DEXCOM G6 TRANSMITTER) MISC USE FOR CONTINUOUS BLOOD GLUCOSE MONITORING REPLACE TRANSMITTER EVERY 90 DAYS     Dapagliflozin-Metformin HCl ER 03-999 MG TB24 Take by mouth.     gabapentin (NEURONTIN) 300 MG capsule TAKE 1 CAPSULE BY MOUTH THREE TIMES A DAY 270 capsule 0   Lancets (ACCU-CHEK SOFT TOUCH) lancets Use as instructed 300 each 3   meloxicam (MOBIC) 15 MG tablet TAKE 1 TABLET (15 MG TOTAL) BY MOUTH DAILY. 90 tablet 0   omeprazole (PRILOSEC) 20 MG capsule Take 1 capsule (20 mg total) by mouth daily. 30 capsule 3   sertraline (ZOLOFT) 50 MG tablet TAKE 1 TABLET BY MOUTH EVERY DAY 90 tablet 2   traZODone (DESYREL) 50 MG tablet TAKE 1-2 TABLETS (50-100 MG TOTAL) BY MOUTH AT BEDTIME AS NEEDED (FOR INSOMNIA). FOR SLEEP 180 tablet 1   Albuterol-Budesonide (AIRSUPRA) 90-80 MCG/ACT AERO Inhale 2 puffs into the lungs every 4 (four) hours as needed. 10.7 g 0   brompheniramine-pseudoephedrine-DM 30-2-10 MG/5ML syrup Take 5 mLs by mouth 4 (four) times daily as needed. (Patient not taking: Reported on 12/10/2022) 120 mL 0   Continuous Blood Gluc Sensor (DEXCOM G6 SENSOR) MISC USE FOR CONTINUOUS BLOOD GLUCOSE MONITORING REPLACE SENSOR EVERY 10 DAYS     azithromycin (ZITHROMAX Z-PAK) 250 MG tablet Take 2 tabs today, then 1 tab until gone (Patient not taking: Reported on 12/10/2022) 6 each 0   No facility-administered medications prior to visit.

## 2022-12-10 NOTE — Telephone Encounter (Signed)
Patient is aware that we do not have any availability sooner then 4:00 today. She will keep scheduled visit. Nothing further needed.

## 2022-12-11 ENCOUNTER — Ambulatory Visit: Payer: 59 | Admitting: Internal Medicine

## 2022-12-13 ENCOUNTER — Telehealth: Payer: 59 | Admitting: Physician Assistant

## 2022-12-13 DIAGNOSIS — B9689 Other specified bacterial agents as the cause of diseases classified elsewhere: Secondary | ICD-10-CM | POA: Diagnosis not present

## 2022-12-13 DIAGNOSIS — J069 Acute upper respiratory infection, unspecified: Secondary | ICD-10-CM

## 2022-12-13 MED ORDER — PREDNISONE 20 MG PO TABS: 20.0000 mg | ORAL_TABLET | Freq: Every day | ORAL | 0 refills | Status: DC

## 2022-12-13 MED ORDER — DOXYCYCLINE HYCLATE 100 MG PO TABS: 100.0000 mg | ORAL_TABLET | Freq: Two times a day (BID) | ORAL | 0 refills | Status: DC

## 2022-12-13 NOTE — Progress Notes (Signed)
Virtual Visit Consent   Kelly Matthews, you are scheduled for a virtual visit with a [facility] provider today. Just as with appointments in the office, your consent must be obtained to participate. Your consent will be active for this visit and any virtual visit you may have with one of our providers in the next 365 days. If you have a MyChart account, a copy of this consent can be sent to you electronically.  As this is a virtual visit, video technology does not allow for your provider to perform a traditional examination. This may limit your provider's ability to fully assess your condition. If your provider identifies any concerns that need to be evaluated in person or the need to arrange testing (such as labs, EKG, etc.), we will make arrangements to do so. Although advances in technology are sophisticated, we cannot ensure that it will always work on either your end or our end. If the connection with a video visit is poor, the visit may have to be switched to a telephone visit. With either a video or telephone visit, we are not always able to ensure that we have a secure connection.  By engaging in this virtual visit, you consent to the provision of healthcare and authorize for your insurance to be billed (if applicable) for the services provided during this visit. Depending on your insurance coverage, you may receive a charge related to this service.  I need to obtain your verbal consent now. Are you willing to proceed with your visit today? [name] has provided verbal consent on 12/13/2022 for a virtual visit (video or telephone). Mar Daring, PA-C  Date: 12/13/2022 11:57 AM  Virtual Visit via Video Note   I, Mar Daring, connected with  Kelly Matthews  ([id number], 1966/05/27) on 12/13/22 at 11:45 AM EST by a video-enabled telemedicine application and verified that I am speaking with the correct person using two  identifiers.  Location: Patient: Virtual Visit Location Patient: Home Provider: Virtual Visit Location Provider: Home Office   I discussed the limitations of evaluation and management by telemedicine and the availability of in person appointments. The patient expressed understanding and agreed to proceed.    History of Present Illness: Kelly Matthews is a 57 y.o. who identifies as a female who was assigned female at birth, and is being seen today for flu-like symptoms.  HPI: URI  This is a new problem. The current episode started 1 to 4 weeks ago. The problem has been gradually worsening. The maximum temperature recorded prior to her arrival was 100.4 - 100.9 F (100.6, 99.6 is lowest). Associated symptoms include congestion, coughing, diarrhea, headaches, rhinorrhea, sinus pain, a sore throat (scratchy) and wheezing. Pertinent negatives include no ear pain or plugged ear sensation. Associated symptoms comments: Myalgias, fatigue. She has tried inhaler use and acetaminophen (z-pack, given by telehealth doc and completed Thursday; reports she did another telehealth doc visit and was prescribed same thing last night but did not start due to failed treatment previously) for the symptoms.    Covid testing has been negative x 3   Problems:  Patient Active Problem List   Diagnosis Date Noted   Upper airway cough syndrome vs asthmatic bronchitis 11/06/2022   Bright red rectal bleeding 08/25/2021   Class 3 severe obesity in adult [facility]) 11/04/2020   History of colonic polyps    Chronic bilateral low back pain with left-sided sciatica 03/20/2020   Muscle spasm of back 02/05/2020   Muscle  cramp 02/05/2020   Left hip pain 02/05/2020   Psoriasis 03/13/2019   Stomach irritation    Abdominal pain, epigastric    Special screening for malignant neoplasms, colon    Polyp of descending colon    Right leg pain 02/24/2018   Women's annual routine gynecological examination 05/14/2017    Malnutrition of moderate degree 12/07/2016   DKA (diabetic ketoacidoses) 12/05/2016   Migraine headache 05/05/2016   Allergic rhinitis 07/23/2015   Excess, menstruation 07/19/2015   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   Blood glucose elevated 07/19/2015   Airway hyperreactivity 07/19/2015   Disorder of esophagus 07/19/2015   Hypercholesterolemia without hypertriglyceridemia 07/19/2015   Borderline diabetes 07/19/2015   Diabetes ([place]) 11/28/2014   Essential (primary) hypertension 11/28/2014   Aortic heart valve narrowing 11/28/2014   Combined fat and carbohydrate induced hyperlipemia 11/28/2014   Supraventricular tachycardia ([place]) 11/28/2014   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    Allergies:  Allergies  Allergen Reactions   Oxycodone Itching   Penicillins Itching   Medications:  Current Outpatient Medications:    doxycycline (VIBRA-TABS) 100 MG tablet, Take 1 tablet (100 mg total) by mouth 2 (two) times daily., Disp: 20 tablet, Rfl: 0   predniSONE (DELTASONE) 20 MG tablet, Take 1 tablet (20 mg total) by mouth daily with breakfast., Disp: 5 tablet, Rfl: 0   Albuterol-Budesonide (AIRSUPRA) 90-80 MCG/ACT AERO, Inhale 2 puffs into the lungs every 4 (four) hours as needed., Disp: 10.7 g, Rfl: 2   atorvastatin (LIPITOR) 10 MG tablet, Take 10 mg by mouth daily., Disp: , Rfl:    brompheniramine-pseudoephedrine-DM 30-2-10 MG/5ML syrup, Take 5 mLs by mouth 4 (four) times daily as needed. (Patient not taking: Reported on 12/10/2022), Disp: 120 mL, Rfl: 0   Continuous Blood Gluc Receiver (DEXCOM G6 RECEIVER) DEVI, USE FOR CONTINUOUS BLOOD GLUCOSE MONITORING, Disp: , Rfl:    Continuous Blood Gluc Sensor (DEXCOM G6 SENSOR) MISC, USE FOR CONTINUOUS BLOOD GLUCOSE MONITORING REPLACE SENSOR EVERY 10 DAYS, Disp: , Rfl:    Continuous Blood Gluc Transmit (DEXCOM G6 TRANSMITTER) MISC, USE FOR CONTINUOUS BLOOD GLUCOSE MONITORING REPLACE TRANSMITTER EVERY 90 DAYS, Disp: , Rfl:     Dapagliflozin-Metformin HCl ER 03-999 MG TB24, Take by mouth., Disp: , Rfl:    gabapentin (NEURONTIN) 300 MG capsule, TAKE 1 CAPSULE BY MOUTH THREE TIMES A DAY, Disp: 270 capsule, Rfl: 0   Lancets (ACCU-CHEK SOFT TOUCH) lancets, Use as instructed, Disp: 300 each, Rfl: 3   meloxicam (MOBIC) 15 MG tablet, TAKE 1 TABLET (15 MG TOTAL) BY MOUTH DAILY., Disp: 90 tablet, Rfl: 0   omeprazole (PRILOSEC) 20 MG capsule, Take 1 capsule (20 mg total) by mouth daily., Disp: 30 capsule, Rfl: 3   sertraline (ZOLOFT) 50 MG tablet, TAKE 1 TABLET BY MOUTH EVERY DAY, Disp: 90 tablet, Rfl: 2   traZODone (DESYREL) 50 MG tablet, TAKE 1-2 TABLETS (50-100 MG TOTAL) BY MOUTH AT BEDTIME AS NEEDED (FOR INSOMNIA). FOR SLEEP, Disp: 180 tablet, Rfl: 1  Observations/Objective: Patient is well-developed, well-nourished in no acute distress.  Resting comfortably at home.  Head is normocephalic, atraumatic.  No labored breathing.  Speech is clear and coherent with logical content.  Patient is alert and oriented at baseline.    Assessment and Plan: 1. Bacterial upper respiratory infection - doxycycline (VIBRA-TABS) 100 MG tablet; Take 1 tablet (100 mg total) by mouth 2 (two) times daily.  Dispense: 20 tablet; Refill: 0 - predniSONE (DELTASONE) 20 MG tablet; Take 1 tablet (20 mg total) by mouth  daily with breakfast.  Dispense: 5 tablet; Refill: 0  - Worsening over a week despite OTC medications - Will treat with Doxycycline and low dose prednisone (monitor glucose closely) - Can continue Mucinex  - Push fluids.  - Rest.  - Steam and humidifier can help - Seek in person evaluation if worsening or symptoms fail to improve    Follow Up Instructions: I discussed the assessment and treatment plan with the patient. The patient was provided an opportunity to ask questions and all were answered. The patient agreed with the plan and demonstrated an understanding of the instructions.  A copy of instructions were sent to the  patient via MyChart unless otherwise noted below.    The patient was advised to call back or seek an in-person evaluation if the symptoms worsen or if the condition fails to improve as anticipated.  Time:  I spent 15 minutes with the patient via telehealth technology discussing the above problems/concerns.    Mar Daring, PA-C

## 2022-12-13 NOTE — Patient Instructions (Signed)
[name], thank you for joining Mar Daring, PA-C for today's virtual visit.  While this provider is not your primary care provider (PCP), if your PCP is located in our provider database this encounter information will be shared with them immediately following your visit.   [facility] account gives you access to today's visit and all your visits, tests, and labs performed at [facility] " click here if you don't have a [place] account or go to [email or web address]  Consent: (Patient) Kelly Matthews provided verbal consent for this virtual visit at the beginning of the encounter.  Current Medications:  Current Outpatient Medications:    doxycycline (VIBRA-TABS) 100 MG tablet, Take 1 tablet (100 mg total) by mouth 2 (two) times daily., Disp: 20 tablet, Rfl: 0   predniSONE (DELTASONE) 20 MG tablet, Take 1 tablet (20 mg total) by mouth daily with breakfast., Disp: 5 tablet, Rfl: 0   Albuterol-Budesonide (AIRSUPRA) 90-80 MCG/ACT AERO, Inhale 2 puffs into the lungs every 4 (four) hours as needed., Disp: 10.7 g, Rfl: 2   atorvastatin (LIPITOR) 10 MG tablet, Take 10 mg by mouth daily., Disp: , Rfl:    brompheniramine-pseudoephedrine-DM 30-2-10 MG/5ML syrup, Take 5 mLs by mouth 4 (four) times daily as needed. (Patient not taking: Reported on 12/10/2022), Disp: 120 mL, Rfl: 0   Continuous Blood Gluc Receiver (DEXCOM G6 RECEIVER) DEVI, USE FOR CONTINUOUS BLOOD GLUCOSE MONITORING, Disp: , Rfl:    Continuous Blood Gluc Sensor (DEXCOM G6 SENSOR) MISC, USE FOR CONTINUOUS BLOOD GLUCOSE MONITORING REPLACE SENSOR EVERY 10 DAYS, Disp: , Rfl:    Continuous Blood Gluc Transmit (DEXCOM G6 TRANSMITTER) MISC, USE FOR CONTINUOUS BLOOD GLUCOSE MONITORING REPLACE TRANSMITTER EVERY 90 DAYS, Disp: , Rfl:    Dapagliflozin-Metformin HCl ER 03-999 MG TB24, Take by mouth., Disp: , Rfl:    gabapentin (NEURONTIN) 300 MG capsule, TAKE 1 CAPSULE BY MOUTH THREE  TIMES A DAY, Disp: 270 capsule, Rfl: 0   Lancets (ACCU-CHEK SOFT TOUCH) lancets, Use as instructed, Disp: 300 each, Rfl: 3   meloxicam (MOBIC) 15 MG tablet, TAKE 1 TABLET (15 MG TOTAL) BY MOUTH DAILY., Disp: 90 tablet, Rfl: 0   omeprazole (PRILOSEC) 20 MG capsule, Take 1 capsule (20 mg total) by mouth daily., Disp: 30 capsule, Rfl: 3   sertraline (ZOLOFT) 50 MG tablet, TAKE 1 TABLET BY MOUTH EVERY DAY, Disp: 90 tablet, Rfl: 2   traZODone (DESYREL) 50 MG tablet, TAKE 1-2 TABLETS (50-100 MG TOTAL) BY MOUTH AT BEDTIME AS NEEDED (FOR INSOMNIA). FOR SLEEP, Disp: 180 tablet, Rfl: 1   Medications ordered in this encounter:  Meds ordered this encounter  Medications   doxycycline (VIBRA-TABS) 100 MG tablet    Sig: Take 1 tablet (100 mg total) by mouth 2 (two) times daily.    Dispense:  20 tablet    Refill:  0    Order Specific Question:   Supervising Provider    Answer:   Chase Picket [[id number]]   predniSONE (DELTASONE) 20 MG tablet    Sig: Take 1 tablet (20 mg total) by mouth daily with breakfast.    Dispense:  5 tablet    Refill:  0    Order Specific Question:   Supervising Provider    Answer:   Chase Picket [[id number]]     *If you need refills on other medications prior to your next appointment, please contact your pharmacy*  Follow-Up: Call back or seek an in-person evaluation if the symptoms worsen or  if the condition fails to improve as anticipated.  [place] [phone]  Other Instructions  Upper Respiratory Infection, Adult An upper respiratory infection (URI) is a common viral infection of the nose, throat, and upper air passages that lead to the lungs. The most common type of URI is the common cold. URIs usually get better on their own, without medical treatment. What are the causes? A URI is caused by a virus. You may catch a virus by: Breathing in droplets from an infected person's cough or sneeze. Touching something that has been exposed to the  virus (is contaminated) and then touching your mouth, nose, or eyes. What increases the risk? You are more likely to get a URI if: You are very young or very old. You have close contact with others, such as at work, school, or a health care facility. You smoke. You have long-term (chronic) heart or lung disease. You have a weakened disease-fighting system (immune system). You have nasal allergies or asthma. You are experiencing a lot of stress. You have poor nutrition. What are the signs or symptoms? A URI usually involves some of the following symptoms: Runny or stuffy (congested) nose. Cough. Sneezing. Sore throat. Headache. Fatigue. Fever. Loss of appetite. Pain in your forehead, behind your eyes, and over your cheekbones (sinus pain). Muscle aches. Redness or irritation of the eyes. Pressure in the ears or face. How is this diagnosed? This condition may be diagnosed based on your medical history and symptoms, and a physical exam. Your health care provider may use a swab to take a mucus sample from your nose (nasal swab). This sample can be tested to determine what virus is causing the illness. How is this treated? URIs usually get better on their own within 7-10 days. Medicines cannot cure URIs, but your health care provider may recommend certain medicines to help relieve symptoms, such as: Over-the-counter cold medicines. Cough suppressants. Coughing is a type of defense against infection that helps to clear the respiratory system, so take these medicines only as recommended by your health care provider. Fever-reducing medicines. Follow these instructions at home: Activity Rest as needed. If you have a fever, stay home from work or school until your fever is gone or until your health care provider says your URI cannot spread to other people (is no longer contagious). Your health care provider may have you wear a face mask to prevent your infection from spreading. Relieving  symptoms Gargle with a mixture of salt and water 3-4 times a day or as needed. To make salt water, completely dissolve -1 tsp (3-6 g) of salt in 1 cup (237 mL) of warm water. Use a cool-mist humidifier to add moisture to the air. This can help you breathe more easily. Eating and drinking  Drink enough fluid to keep your urine pale yellow. Eat soups and other clear broths. General instructions  Take over-the-counter and prescription medicines only as told by your health care provider. These include cold medicines, fever reducers, and cough suppressants. Do not use any products that contain nicotine or tobacco. These products include cigarettes, chewing tobacco, and vaping devices, such as e-cigarettes. If you need help quitting, ask your health care provider. Stay away from secondhand smoke. Stay up to date on all immunizations, including the yearly (annual) flu vaccine. Keep all follow-up visits. This is important. How to prevent the spread of infection to others URIs can be contagious. To prevent the infection from spreading: Wash your hands with soap and  water for at least 20 seconds. If soap and water are not available, use hand sanitizer. Avoid touching your mouth, face, eyes, or nose. Cough or sneeze into a tissue or your sleeve or elbow instead of into your hand or into the air.  Contact a health care provider if: You are getting worse instead of better. You have a fever or chills. Your mucus is brown or red. You have yellow or brown discharge coming from your nose. You have pain in your face, especially when you bend forward. You have swollen neck glands. You have pain while swallowing. You have white areas in the back of your throat. Get help right away if: You have shortness of breath that gets worse. You have severe or persistent: Headache. Ear pain. Sinus pain. Chest pain. You have chronic lung disease along with any of the following: Making high-pitched whistling  sounds when you breathe, most often when you breathe out (wheezing). Prolonged cough (more than 14 days). Coughing up blood. A change in your usual mucus. You have a stiff neck. You have changes in your: Vision. Hearing. Thinking. Mood. These symptoms may be an emergency. Get help right away. Call 911. Do not wait to see if the symptoms will go away. Do not drive yourself to the hospital. Summary An upper respiratory infection (URI) is a common infection of the nose, throat, and upper air passages that lead to the lungs. A URI is caused by a virus. URIs usually get better on their own within 7-10 days. Medicines cannot cure URIs, but your health care provider may recommend certain medicines to help relieve symptoms. This information is not intended to replace advice given to you by your health care provider. Make sure you discuss any questions you have with your health care provider. Document Revised: 06/04/2021 Document Reviewed: 06/04/2021 Elsevier Patient Education  [place].    If you have been instructed to have an in-person evaluation today at a local Urgent Care facility, please use the link below. It will take you to a list of all of our available [place] [facility], including address, phone number and hours of operation. Please do not delay care.  [facility]  If you or a family member do not have a primary care provider, use the link below to schedule a visit and establish care. When you choose a [facility] primary care physician or advanced practice provider, you gain a long-term partner in health. Find a Primary Care Provider  Learn more about [facility]'s in-office and virtual care options: [facility]

## 2022-12-14 ENCOUNTER — Encounter: Payer: Self-pay | Admitting: Internal Medicine

## 2022-12-14 ENCOUNTER — Telehealth: Payer: Self-pay | Admitting: Internal Medicine

## 2022-12-14 NOTE — Telephone Encounter (Signed)
Too soon to call response or not, needs to be seen by end of week by NP or one of the pulmonary docs if not doing better by then

## 2022-12-14 NOTE — Telephone Encounter (Signed)
I spoke with the patient. Cough with green sputum Wheezing. SOB and weakness but does not check her O2 at home Gets weak when she gets up. Fever of 100 today. It has been running between 99-100 for a week. Tested for Covid at home yesterday and it was negative. Was seen in the ER 12/08/2022 and was tested for Covid/Flu/RSV which were all negative. She saw Dr. Genia Harold on 12/10/2022 and was given AirSuppra which has helped with her cough some.  Did a virtual visit with [facility] yesterday and was given Prednisone and Doxycyline.  She said she just does not feel good. And wants to know if you have anymore options for her.

## 2022-12-14 NOTE — Telephone Encounter (Signed)
Called and spoke with pt letting her know info per Dr. Melvyn Novas and she verbalized understanding. Nothing further needed.

## 2022-12-14 NOTE — Telephone Encounter (Signed)
Fine with me to stay out of work until feeling better

## 2022-12-15 ENCOUNTER — Emergency Department
Admission: EM | Admit: 2022-12-15 | Discharge: 2022-12-15 | Disposition: A | Discharge status: Home / Self Care | Payer: 59 | Attending: Emergency Medicine | Admitting: Emergency Medicine

## 2022-12-15 ENCOUNTER — Emergency Department: Payer: 59

## 2022-12-15 ENCOUNTER — Encounter: Payer: Self-pay | Admitting: Emergency Medicine

## 2022-12-15 ENCOUNTER — Other Ambulatory Visit: Payer: Self-pay

## 2022-12-15 DIAGNOSIS — Z20822 Contact with and (suspected) exposure to covid-19: Secondary | ICD-10-CM | POA: Diagnosis not present

## 2022-12-15 DIAGNOSIS — E119 Type 2 diabetes mellitus without complications: Secondary | ICD-10-CM | POA: Diagnosis not present

## 2022-12-15 DIAGNOSIS — I10 Essential (primary) hypertension: Secondary | ICD-10-CM | POA: Diagnosis not present

## 2022-12-15 DIAGNOSIS — B349 Viral infection, unspecified: Secondary | ICD-10-CM | POA: Diagnosis not present

## 2022-12-15 DIAGNOSIS — R0602 Shortness of breath: Secondary | ICD-10-CM | POA: Diagnosis not present

## 2022-12-15 DIAGNOSIS — R062 Wheezing: Secondary | ICD-10-CM | POA: Diagnosis present

## 2022-12-15 DIAGNOSIS — R059 Cough, unspecified: Secondary | ICD-10-CM | POA: Diagnosis not present

## 2022-12-15 LAB — RESP PANEL BY RT-PCR (RSV, FLU A&B, COVID)  RVPGX2
Influenza A by PCR: NEGATIVE
Influenza B by PCR: NEGATIVE
Resp Syncytial Virus by PCR: NEGATIVE
SARS Coronavirus 2 by RT PCR: NEGATIVE

## 2022-12-15 MED ORDER — IPRATROPIUM-ALBUTEROL 0.5-2.5 (3) MG/3ML IN SOLN
3.0000 mL | Freq: Once | RESPIRATORY_TRACT | Status: AC
  Administered 2022-12-15: 3 mL via RESPIRATORY_TRACT
  Filled 2022-12-15: qty 3

## 2022-12-15 NOTE — ED Triage Notes (Signed)
Pt states that she was seen last week and was diagnosed with an uri, pt reports that she isn't feeling better, states that she doesn't have any energy, and states that she cont to have fevers intermittently, pt reports that she has followed up with her pulmonologist as well. Pt denies taking any meds this am

## 2022-12-15 NOTE — ED Provider Notes (Signed)
   [facility] Provider Note    Event Date/Time   First MD Initiated Contact with Patient 12/15/22 [phone]     (approximate)   History   Fever   HPI  Kelly Matthews is a 57 y.o. female with a history of diabetes, hypertension airway hyperreactivity who presents with complaints of fatigue weakness, cough.  Patient seen 1 week ago negative viral PCR, has follow-up with pulmonologist but reports feels very fatigued today, no significant shortness of breath although does report some wheezing and cough.  She reports body aches as well     Physical Exam   Triage Vital Signs: ED Triage Vitals  Enc Vitals Group     BP 12/15/22 0755 (!) 168/84     Pulse Rate 12/15/22 0755 92     Resp 12/15/22 0755 18     Temp 12/15/22 0755 98.8 F (37.1 C)     Temp Source 12/15/22 0755 Oral     SpO2 12/15/22 0755 97 %     Weight 12/15/22 0756 87.1 kg (192 lb)     Height 12/15/22 0756 1.524 m (5')     Head Circumference --      Peak Flow --      Pain Score 12/15/22 0756 8     Pain Loc --      Pain Edu? --      Excl. in [place]? --     Most recent vital signs: Vitals:   12/15/22 0755 12/15/22 0929  BP: (!) 168/84 (!) 160/80  Pulse: 92 88  Resp: 18 18  Temp: 98.8 F (37.1 C)   SpO2: 97% 98%     General: Awake, no distress.  CV:  Good peripheral perfusion.  Resp:  Normal effort.  Scattered mild wheezes Abd:  No distention.  Other:     ED Results / Procedures / Treatments   Labs (all labs ordered are listed, but only abnormal results are displayed) Labs Reviewed  RESP PANEL BY RT-PCR (RSV, FLU A&B, COVID)  [id number]     EKG     RADIOLOGY Chest x-ray viewed interpreted by me, no pneumonia    PROCEDURES:  Critical Care performed:   Procedures   MEDICATIONS ORDERED IN ED: Medications  ipratropium-albuterol (DUONEB) 0.5-2.5 (3) MG/3ML nebulizer solution 3 mL (3 mLs Nebulization Given 12/15/22 0819)  ipratropium-albuterol (DUONEB) 0.5-2.5 (3)  MG/3ML nebulizer solution 3 mL (3 mLs Nebulization Given 12/15/22 0819)     IMPRESSION / MDM / ASSESSMENT AND PLAN / ED COURSE  I reviewed the triage vital signs and the nursing notes. Patient's presentation is most consistent with acute illness / injury with system symptoms.  Patient presents with symptoms most consistent with viral illness possibly causing some bronchospasm given her wheezing.  Differential also includes bacterial pneumonia.  Will obtain chest x-ray, viral PCR, treat with DuoNebs and reevaluate.  Chest x-ray negative for pneumonia, viral panel is negative patient improved after DuoNeb, appropriate for discharge at this time with outpatient follow-up as needed.  Recommend supportive care      FINAL CLINICAL IMPRESSION(S) / ED DIAGNOSES   Final diagnoses:  Viral syndrome     Rx / DC Orders   ED Discharge Orders     None        Note:  This document was prepared using Dragon voice recognition software and may include unintentional dictation errors.   Lavonia Drafts, MD 12/15/22 [phone]

## 2022-12-15 NOTE — ED Notes (Signed)
See triage note  States she was seen last week and dx'd with URI  states she wasn't feeling any better so she had a virtual visit   Was started on doxy and prednisone    States this am she feels weak  Subjective fever at home  but currently afebrile

## 2022-12-16 ENCOUNTER — Telehealth: Payer: 59 | Admitting: Physician Assistant

## 2022-12-16 DIAGNOSIS — R053 Chronic cough: Secondary | ICD-10-CM

## 2022-12-16 MED ORDER — PROMETHAZINE-DM 6.25-15 MG/5ML PO SYRP: 5.0000 mL | ORAL_SOLUTION | Freq: Four times a day (QID) | ORAL | 0 refills | Status: DC | PRN

## 2022-12-16 NOTE — Patient Instructions (Signed)
[name], thank you for joining Leeanne Rio, PA-C for today's virtual visit.  While this provider is not your primary care provider (PCP), if your PCP is located in our provider database this encounter information will be shared with them immediately following your visit.   [facility] account gives you access to today's visit and all your visits, tests, and labs performed at [facility] " click here if you don't have a [facility] account or go to [email or web address]  Consent: (Patient) Kelly Matthews provided verbal consent for this virtual visit at the beginning of the encounter.  Current Medications:  Current Outpatient Medications:    promethazine-dextromethorphan (PROMETHAZINE-DM) 6.25-15 MG/5ML syrup, Take 5 mLs by mouth 4 (four) times daily as needed for cough., Disp: 118 mL, Rfl: 0   Albuterol-Budesonide (AIRSUPRA) 90-80 MCG/ACT AERO, Inhale 2 puffs into the lungs every 4 (four) hours as needed., Disp: 10.7 g, Rfl: 2   atorvastatin (LIPITOR) 10 MG tablet, Take 10 mg by mouth daily., Disp: , Rfl:    Continuous Blood Gluc Receiver (DEXCOM G6 RECEIVER) DEVI, USE FOR CONTINUOUS BLOOD GLUCOSE MONITORING, Disp: , Rfl:    Continuous Blood Gluc Sensor (DEXCOM G6 SENSOR) MISC, USE FOR CONTINUOUS BLOOD GLUCOSE MONITORING REPLACE SENSOR EVERY 10 DAYS, Disp: , Rfl:    Continuous Blood Gluc Transmit (DEXCOM G6 TRANSMITTER) MISC, USE FOR CONTINUOUS BLOOD GLUCOSE MONITORING REPLACE TRANSMITTER EVERY 90 DAYS, Disp: , Rfl:    Dapagliflozin-Metformin HCl ER 03-999 MG TB24, Take by mouth., Disp: , Rfl:    doxycycline (VIBRA-TABS) 100 MG tablet, Take 1 tablet (100 mg total) by mouth 2 (two) times daily., Disp: 20 tablet, Rfl: 0   gabapentin (NEURONTIN) 300 MG capsule, TAKE 1 CAPSULE BY MOUTH THREE TIMES A DAY, Disp: 270 capsule, Rfl: 0   Lancets (ACCU-CHEK SOFT TOUCH) lancets, Use as instructed, Disp: 300 each, Rfl: 3   meloxicam (MOBIC)  15 MG tablet, TAKE 1 TABLET (15 MG TOTAL) BY MOUTH DAILY., Disp: 90 tablet, Rfl: 0   omeprazole (PRILOSEC) 20 MG capsule, Take 1 capsule (20 mg total) by mouth daily., Disp: 30 capsule, Rfl: 3   predniSONE (DELTASONE) 20 MG tablet, Take 1 tablet (20 mg total) by mouth daily with breakfast., Disp: 5 tablet, Rfl: 0   sertraline (ZOLOFT) 50 MG tablet, TAKE 1 TABLET BY MOUTH EVERY DAY, Disp: 90 tablet, Rfl: 2   traZODone (DESYREL) 50 MG tablet, TAKE 1-2 TABLETS (50-100 MG TOTAL) BY MOUTH AT BEDTIME AS NEEDED (FOR INSOMNIA). FOR SLEEP, Disp: 180 tablet, Rfl: 1   Medications ordered in this encounter:  Meds ordered this encounter  Medications   promethazine-dextromethorphan (PROMETHAZINE-DM) 6.25-15 MG/5ML syrup    Sig: Take 5 mLs by mouth 4 (four) times daily as needed for cough.    Dispense:  118 mL    Refill:  0    Order Specific Question:   Supervising Provider    Answer:   Chase Picket [[id number]]     *If you need refills on other medications prior to your next appointment, please contact your pharmacy*  Follow-Up: Call back or seek an in-person evaluation if the symptoms worsen or if the condition fails to improve as anticipated.  [place] [phone]  Other Instructions Please continue antibiotic and steroid, starting cough medication given. Call your pulmonologist first thing in the morning to schedule a follow-up.    If you have been instructed to have an in-person evaluation today at a local Urgent  Care facility, please use the link below. It will take you to a list of all of our available [facility], including address, phone number and hours of operation. Please do not delay care.  [facility]  If you or a family member do not have a primary care provider, use the link below to schedule a visit and establish care. When you choose a [facility] primary care physician or advanced practice provider, you gain a long-term partner in  health. Find a Primary Care Provider  Learn more about 's in-office and virtual care options: [facility]

## 2022-12-16 NOTE — Progress Notes (Signed)
Virtual Visit Consent   Kelly Matthews, you are scheduled for a virtual visit with a [facility] provider today. Just as with appointments in the office, your consent must be obtained to participate. Your consent will be active for this visit and any virtual visit you may have with one of our providers in the next 365 days. If you have a MyChart account, a copy of this consent can be sent to you electronically.  As this is a virtual visit, video technology does not allow for your provider to perform a traditional examination. This may limit your provider's ability to fully assess your condition. If your provider identifies any concerns that need to be evaluated in person or the need to arrange testing (such as labs, EKG, etc.), we will make arrangements to do so. Although advances in technology are sophisticated, we cannot ensure that it will always work on either your end or our end. If the connection with a video visit is poor, the visit may have to be switched to a telephone visit. With either a video or telephone visit, we are not always able to ensure that we have a secure connection.  By engaging in this virtual visit, you consent to the provision of healthcare and authorize for your insurance to be billed (if applicable) for the services provided during this visit. Depending on your insurance coverage, you may receive a charge related to this service.  I need to obtain your verbal consent now. Are you willing to proceed with your visit today? Kelly Matthews has provided verbal consent on 12/16/2022 for a virtual visit (video or telephone). Leeanne Rio, [place]  Date: 12/16/2022 6:52 PM  Virtual Visit via Video Note   I, Leeanne Rio, connected with  Kelly Matthews  ([id number], [birth year]) on 12/16/22 at  6:30 PM EST by a video-enabled telemedicine application and verified that I am speaking with the correct person using two  identifiers.  Location: Patient: Virtual Visit Location Patient: Home Provider: Virtual Visit Location Provider: Home Office   I discussed the limitations of evaluation and management by telemedicine and the availability of in person appointments. The patient expressed understanding and agreed to proceed.    History of Present Illness: Kelly Matthews is a 57 y.o. who identifies as a female who was assigned female at birth, and is being seen today for ongoing respiratory symptoms. Has had ongoing issue with recurring URI over the past few months, now followed by Pulmonology for suspected upper airway cough syndrome. In the past weekl or so noting increase in symptoms. Was seen at [facility] ER on 12/08/2022 at which time she had respiratory panel and CXR all negative. Diagnosed with viral URI and started on a cough medication. Was then seen by Pulmonologist on 1/25 for follow-up. Normal exam. Was given Airsupra to start and scheduled for routine follow-up with her main pulmonologist. Patient was then evaluated on 1/28 via video for progressively worsening symptoms. Was started on Doxy and prednisone. Then presented to ER again on 1/30 for continued symptoms. Again respiratory panel and CXR checked -- both negative. Duoneb given with some relief. She was instructed to follow-up with her Pulmonologist.   Tonight notes she just continues to have symptoms. Breathing is better than yesterday but still with frequent cough. OTC cough medications are not helping. Very frustrated because at this point she feels like no one can figure out what is going on with her. She is still taking antibiotic and prednisone. States  she has a continued fever but her Tmax is 100.3 at most, usually in 99 range.    HPI: HPI  Problems:  Patient Active Problem List   Diagnosis Date Noted   Upper airway cough syndrome vs asthmatic bronchitis 11/06/2022   Bright red rectal bleeding 08/25/2021   Class 3 severe obesity in adult  [facility]) 11/04/2020   History of colonic polyps    Chronic bilateral low back pain with left-sided sciatica 03/20/2020   Muscle spasm of back 02/05/2020   Muscle cramp 02/05/2020   Left hip pain 02/05/2020   Psoriasis 03/13/2019   Stomach irritation    Abdominal pain, epigastric    Special screening for malignant neoplasms, colon    Polyp of descending colon    Right leg pain 02/24/2018   Women's annual routine gynecological examination 05/14/2017   Malnutrition of moderate degree 12/07/2016   DKA (diabetic ketoacidoses) 12/05/2016   Migraine headache 05/05/2016   Allergic rhinitis 07/23/2015   Excess, menstruation 07/19/2015   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   Blood glucose elevated 07/19/2015   Airway hyperreactivity 07/19/2015   Disorder of esophagus 07/19/2015   Hypercholesterolemia without hypertriglyceridemia 07/19/2015   Borderline diabetes 07/19/2015   Diabetes ([place]) 11/28/2014   Essential (primary) hypertension 11/28/2014   Aortic heart valve narrowing 11/28/2014   Combined fat and carbohydrate induced hyperlipemia 11/28/2014   Supraventricular tachycardia ([place]) 11/28/2014   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    Allergies:  Allergies  Allergen Reactions   Oxycodone Itching   Penicillins Itching   Medications:  Current Outpatient Medications:    promethazine-dextromethorphan (PROMETHAZINE-DM) 6.25-15 MG/5ML syrup, Take 5 mLs by mouth 4 (four) times daily as needed for cough., Disp: 118 mL, Rfl: 0   Albuterol-Budesonide (AIRSUPRA) 90-80 MCG/ACT AERO, Inhale 2 puffs into the lungs every 4 (four) hours as needed., Disp: 10.7 g, Rfl: 2   atorvastatin (LIPITOR) 10 MG tablet, Take 10 mg by mouth daily., Disp: , Rfl:    Continuous Blood Gluc Receiver (DEXCOM G6 RECEIVER) DEVI, USE FOR CONTINUOUS BLOOD GLUCOSE MONITORING, Disp: , Rfl:    Continuous Blood Gluc Sensor (DEXCOM G6 SENSOR) MISC, USE FOR CONTINUOUS BLOOD GLUCOSE MONITORING REPLACE SENSOR EVERY 10  DAYS, Disp: , Rfl:    Continuous Blood Gluc Transmit (DEXCOM G6 TRANSMITTER) MISC, USE FOR CONTINUOUS BLOOD GLUCOSE MONITORING REPLACE TRANSMITTER EVERY 90 DAYS, Disp: , Rfl:    Dapagliflozin-Metformin HCl ER 03-999 MG TB24, Take by mouth., Disp: , Rfl:    doxycycline (VIBRA-TABS) 100 MG tablet, Take 1 tablet (100 mg total) by mouth 2 (two) times daily., Disp: 20 tablet, Rfl: 0   gabapentin (NEURONTIN) 300 MG capsule, TAKE 1 CAPSULE BY MOUTH THREE TIMES A DAY, Disp: 270 capsule, Rfl: 0   Lancets (ACCU-CHEK SOFT TOUCH) lancets, Use as instructed, Disp: 300 each, Rfl: 3   meloxicam (MOBIC) 15 MG tablet, TAKE 1 TABLET (15 MG TOTAL) BY MOUTH DAILY., Disp: 90 tablet, Rfl: 0   omeprazole (PRILOSEC) 20 MG capsule, Take 1 capsule (20 mg total) by mouth daily., Disp: 30 capsule, Rfl: 3   predniSONE (DELTASONE) 20 MG tablet, Take 1 tablet (20 mg total) by mouth daily with breakfast., Disp: 5 tablet, Rfl: 0   sertraline (ZOLOFT) 50 MG tablet, TAKE 1 TABLET BY MOUTH EVERY DAY, Disp: 90 tablet, Rfl: 2   traZODone (DESYREL) 50 MG tablet, TAKE 1-2 TABLETS (50-100 MG TOTAL) BY MOUTH AT BEDTIME AS NEEDED (FOR INSOMNIA). FOR SLEEP, Disp: 180 tablet, Rfl: 1  Observations/Objective: Patient is well-developed,  well-nourished in no acute distress.  Resting comfortably at home.  Head is normocephalic, atraumatic.  No labored breathing. Speech is clear and coherent with logical content.  Patient is alert and oriented at baseline.   Assessment and Plan: 1. Persistent cough - promethazine-dextromethorphan (PROMETHAZINE-DM) 6.25-15 MG/5ML syrup; Take 5 mLs by mouth 4 (four) times daily as needed for cough.  Dispense: 118 mL; Refill: 0  Has been ongoing, waxing and waning in severity. Has been assessed in ER x 2 and with her pulmonologist. Negative workup. Suspected upper airway cough syndrome. Encouraged her to follow-up with her pulmonologist as there is not more that we can offer via a virtual urgent care visit. Will  have her complete entire course of antibiotic and steroid that were already started. Sent in prescription cough medication for her as she notes she was never able to get the last Rx as pharmacy was out of stock. She is to call her specialist tomorrow morning to get scheduled for a follow-up.   Follow Up Instructions: I discussed the assessment and treatment plan with the patient. The patient was provided an opportunity to ask questions and all were answered. The patient agreed with the plan and demonstrated an understanding of the instructions.  A copy of instructions were sent to the patient via MyChart unless otherwise noted below.   The patient was advised to call back or seek an in-person evaluation if the symptoms worsen or if the condition fails to improve as anticipated.  Time:  I spent 10 minutes with the patient via telehealth technology discussing the above problems/concerns.    Leeanne Rio, PA-C

## 2022-12-17 ENCOUNTER — Ambulatory Visit (INDEPENDENT_AMBULATORY_CARE_PROVIDER_SITE_OTHER): Payer: 59 | Admitting: Physician Assistant

## 2022-12-17 ENCOUNTER — Encounter: Payer: Self-pay | Admitting: Physician Assistant

## 2022-12-17 VITALS — BP 109/75 | HR 100 | Temp 97.9°F | Ht 60.0 in | Wt 206.0 lb

## 2022-12-17 DIAGNOSIS — R7303 Prediabetes: Secondary | ICD-10-CM

## 2022-12-17 DIAGNOSIS — J45909 Unspecified asthma, uncomplicated: Secondary | ICD-10-CM

## 2022-12-17 DIAGNOSIS — R053 Chronic cough: Secondary | ICD-10-CM | POA: Diagnosis not present

## 2022-12-17 DIAGNOSIS — J0100 Acute maxillary sinusitis, unspecified: Secondary | ICD-10-CM

## 2022-12-17 DIAGNOSIS — R197 Diarrhea, unspecified: Secondary | ICD-10-CM | POA: Diagnosis not present

## 2022-12-17 DIAGNOSIS — I10 Essential (primary) hypertension: Secondary | ICD-10-CM

## 2022-12-17 LAB — POCT URINALYSIS DIPSTICK
Bilirubin, UA: NEGATIVE
Blood, UA: NEGATIVE
Glucose, UA: POSITIVE — AB
Ketones, UA: NEGATIVE
Nitrite, UA: NEGATIVE
Protein, UA: POSITIVE — AB
Spec Grav, UA: 1.015 (ref 1.010–1.025)
Urobilinogen, UA: 0.2 E.U./dL
pH, UA: 6 (ref 5.0–8.0)

## 2022-12-17 MED ORDER — OMEPRAZOLE 40 MG PO CPDR: 40.0000 mg | DELAYED_RELEASE_CAPSULE | Freq: Every day | ORAL | 3 refills | Status: DC

## 2022-12-17 NOTE — Progress Notes (Signed)
Established patient visit   Patient: Kelly Matthews   DOB: 02/16/1966   57 y.o. Female  MRN: [id number] Visit Date: 12/17/2022  Today's healthcare provider: Mardene Speak, PA-C   Chief Complaint  Patient presents with   Fever   Subjective    Fever  This is a new problem. Episode onset: X1 week. The problem occurs intermittently. The problem has been gradually worsening. The maximum temperature noted was 100 to 100.9 F. Temperature source: Ear thermometer. Associated symptoms include diarrhea and nausea. Associated symptoms comments: Fatigue,loss of appetite. Treatments tried: inhaler, prednisone, doxycycline. The treatment provided mild relief.  4 times this morning, Lose stool for 6-7 days, pure liquid and gray color. Takes imodium and pepto x  Abdominal pain and epigastric pain  HPI   Pt had appt for URI 4 days ago, pt went to ER, neg flu and covid, pt has RSV 10/2022  Last edited by Zada Girt, CMA on 12/17/2022 10:33 AM.      ***  Medications: Outpatient Medications Prior to Visit  Medication Sig   Albuterol-Budesonide (AIRSUPRA) 90-80 MCG/ACT AERO Inhale 2 puffs into the lungs every 4 (four) hours as needed.   atorvastatin (LIPITOR) 10 MG tablet Take 10 mg by mouth daily.   Continuous Blood Gluc Receiver (DEXCOM G6 RECEIVER) DEVI USE FOR CONTINUOUS BLOOD GLUCOSE MONITORING   Continuous Blood Gluc Sensor (DEXCOM G6 SENSOR) MISC USE FOR CONTINUOUS BLOOD GLUCOSE MONITORING REPLACE SENSOR EVERY 10 DAYS   Continuous Blood Gluc Transmit (DEXCOM G6 TRANSMITTER) MISC USE FOR CONTINUOUS BLOOD GLUCOSE MONITORING REPLACE TRANSMITTER EVERY 90 DAYS   cyclobenzaprine (FLEXERIL) 10 MG tablet TAKE 1 TABLET EVERY DAY BY ORAL ROUTE IN THE EVENING FOR 5 DAYS.   Dapagliflozin-Metformin HCl ER 03-999 MG TB24 Take by mouth.   doxycycline (VIBRA-TABS) 100 MG tablet Take 1 tablet (100 mg total) by mouth 2 (two) times daily.   gabapentin (NEURONTIN) 300 MG capsule TAKE 1  CAPSULE BY MOUTH THREE TIMES A DAY   JARDIANCE 25 MG TABS tablet Take 25 mg by mouth daily.   Lancets (ACCU-CHEK SOFT TOUCH) lancets Use as instructed   meloxicam (MOBIC) 15 MG tablet TAKE 1 TABLET (15 MG TOTAL) BY MOUTH DAILY.   metFORMIN (GLUCOPHAGE) 1000 MG tablet Take 1,000 mg by mouth 2 (two) times daily.   NOVOLOG FLEXPEN 100 UNIT/ML FlexPen INJECT UP TO 60 UNITS DAILY IN DIVIDED DOSES AS DIRECTED   omeprazole (PRILOSEC) 20 MG capsule Take 1 capsule (20 mg total) by mouth daily.   predniSONE (DELTASONE) 20 MG tablet Take 1 tablet (20 mg total) by mouth daily with breakfast.   promethazine-dextromethorphan (PROMETHAZINE-DM) 6.25-15 MG/5ML syrup Take 5 mLs by mouth 4 (four) times daily as needed for cough.   sertraline (ZOLOFT) 50 MG tablet TAKE 1 TABLET BY MOUTH EVERY DAY   TOUJEO SOLOSTAR 300 UNIT/ML Solostar Pen INJECT 50 UNITS EVERY DAY   traZODone (DESYREL) 50 MG tablet TAKE 1-2 TABLETS (50-100 MG TOTAL) BY MOUTH AT BEDTIME AS NEEDED (FOR INSOMNIA). FOR SLEEP   No facility-administered medications prior to visit.    Review of Systems  Constitutional:  Positive for fever.  Gastrointestinal:  Positive for diarrhea and nausea.    {Labs  Heme  Chem  Endocrine  Serology  Results Review (optional):23779}   Objective    BP 109/75 (BP Location: Left Arm)   Pulse 100   Temp 97.9 F (36.6 C) (Oral)   Ht 5' (1.524 m)   Wt 206 lb (  93.4 kg)   LMP 04/22/2017 Comment: urine pregnancy test negative  SpO2 99%   BMI 40.23 kg/m  {Show previous vital signs (optional):23777}  Physical Exam  ***  No results found for any visits on 12/17/22.  Assessment & Plan     1. Persistent cough *** - CBC with Differential/Platelet - Comprehensive metabolic panel - omeprazole (PRILOSEC) 40 MG capsule; Take 1 capsule (40 mg total) by mouth daily.  Dispense: 90 capsule; Refill: 3 - HgB A1c - Ambulatory referral to Ophthalmology  2. Acute non-recurrent maxillary sinusitis *** - CBC with  Differential/Platelet - Comprehensive metabolic panel - omeprazole (PRILOSEC) 40 MG capsule; Take 1 capsule (40 mg total) by mouth daily.  Dispense: 90 capsule; Refill: 3 - HgB A1c - Ambulatory referral to Ophthalmology  3. Borderline diabetes *** - CBC with Differential/Platelet - Comprehensive metabolic panel - omeprazole (PRILOSEC) 40 MG capsule; Take 1 capsule (40 mg total) by mouth daily.  Dispense: 90 capsule; Refill: 3 - HgB A1c - Ambulatory referral to Ophthalmology  4. Asthma, unspecified asthma severity, unspecified whether complicated, unspecified whether persistent *** - CBC with Differential/Platelet - Comprehensive metabolic panel - omeprazole (PRILOSEC) 40 MG capsule; Take 1 capsule (40 mg total) by mouth daily.  Dispense: 90 capsule; Refill: 3 - HgB A1c - Ambulatory referral to Ophthalmology  5. Diarrhea, unspecified type *** - CBC with Differential/Platelet - Comprehensive metabolic panel - omeprazole (PRILOSEC) 40 MG capsule; Take 1 capsule (40 mg total) by mouth daily.  Dispense: 90 capsule; Refill: 3 - HgB A1c - Ambulatory referral to Ophthalmology  6. Essential (primary) hypertension *** - POCT urinalysis dipstick  The patient was advised to call back or seek an in-person evaluation if the symptoms worsen or if the condition fails to improve as anticipated.  I discussed the assessment and treatment plan with the patient. The patient was provided an opportunity to ask questions and all were answered. The patient agreed with the plan and demonstrated an understanding of the instructions.  The entirety of the information documented in the History of Present Illness, Review of Systems and Physical Exam were personally obtained by me. Portions of this information were initially documented by the CMA and reviewed by me for thoroughness and accuracy.  Mardene Speak, [facility], [place] [phone] (phone) [phone] (fax)

## 2022-12-17 NOTE — Telephone Encounter (Signed)
Spoke to patient via telephone. She feels that she needs to be seen today. She stated that she would present to UC.  Routing to Dr. Melvyn Novas as an Juluis Rainier.

## 2022-12-17 NOTE — Telephone Encounter (Signed)
The patient was seen in the ED on 12/15/2022 and then had a virtual visit with her PCP yesterday. The next available appointment is Monday (2/5) with you. There are no appointments with any NP tomorrow. Is it ok to schedule her for Monday?

## 2022-12-18 LAB — CBC WITH DIFFERENTIAL/PLATELET
Basophils Absolute: 0.1 10*3/uL (ref 0.0–0.2)
Basos: 0 %
EOS (ABSOLUTE): 0 10*3/uL (ref 0.0–0.4)
Eos: 0 %
Hematocrit: 42.3 % (ref 34.0–46.6)
Hemoglobin: 13.7 g/dL (ref 11.1–15.9)
Immature Grans (Abs): 0.1 10*3/uL (ref 0.0–0.1)
Immature Granulocytes: 1 %
Lymphocytes Absolute: 1.9 10*3/uL (ref 0.7–3.1)
Lymphs: 14 %
MCH: 25.1 pg — ABNORMAL LOW (ref 26.6–33.0)
MCHC: 32.4 g/dL (ref 31.5–35.7)
MCV: 78 fL — ABNORMAL LOW (ref 79–97)
Monocytes Absolute: 0.7 10*3/uL (ref 0.1–0.9)
Monocytes: 5 %
Neutrophils Absolute: 11.1 10*3/uL — ABNORMAL HIGH (ref 1.4–7.0)
Neutrophils: 80 %
Platelets: 230 10*3/uL (ref 150–450)
RBC: 5.45 x10E6/uL — ABNORMAL HIGH (ref 3.77–5.28)
RDW: 15.3 % (ref 11.7–15.4)
WBC: 13.9 10*3/uL — ABNORMAL HIGH (ref 3.4–10.8)

## 2022-12-18 LAB — HEMOGLOBIN A1C
Est. average glucose Bld gHb Est-mCnc: 154 mg/dL
Hgb A1c MFr Bld: 7 % — ABNORMAL HIGH (ref 4.8–5.6)

## 2022-12-18 LAB — COMPREHENSIVE METABOLIC PANEL
ALT: 46 IU/L — ABNORMAL HIGH (ref 0–32)
AST: 23 IU/L (ref 0–40)
Albumin/Globulin Ratio: 1.9 (ref 1.2–2.2)
Albumin: 4.7 g/dL (ref 3.8–4.9)
Alkaline Phosphatase: 124 IU/L — ABNORMAL HIGH (ref 44–121)
BUN/Creatinine Ratio: 20 (ref 9–23)
BUN: 14 mg/dL (ref 6–24)
Bilirubin Total: 0.3 mg/dL (ref 0.0–1.2)
CO2: 20 mmol/L (ref 20–29)
Calcium: 9.5 mg/dL (ref 8.7–10.2)
Chloride: 101 mmol/L (ref 96–106)
Creatinine, Ser: 0.7 mg/dL (ref 0.57–1.00)
Globulin, Total: 2.5 g/dL (ref 1.5–4.5)
Glucose: 124 mg/dL — ABNORMAL HIGH (ref 70–99)
Potassium: 3.5 mmol/L (ref 3.5–5.2)
Sodium: 139 mmol/L (ref 134–144)
Total Protein: 7.2 g/dL (ref 6.0–8.5)
eGFR: 102 mL/min/{1.73_m2} (ref >59)

## 2022-12-18 NOTE — Progress Notes (Signed)
Please, let pt know that her glucose slightly elevated, as well as her liver enzyme but less than before. Her A1c is 7.0 , the same was a year ago. Continue your current regimen but encouraged to adhere to strict low-carb diet and daily exercise as tolerated

## 2022-12-21 ENCOUNTER — Encounter: Payer: Self-pay | Admitting: Physician Assistant

## 2022-12-25 ENCOUNTER — Ambulatory Visit (INDEPENDENT_AMBULATORY_CARE_PROVIDER_SITE_OTHER): Payer: 59 | Admitting: Internal Medicine

## 2022-12-25 ENCOUNTER — Encounter: Payer: Self-pay | Admitting: *Deleted

## 2022-12-25 ENCOUNTER — Encounter: Payer: Self-pay | Admitting: Internal Medicine

## 2022-12-25 VITALS — BP 134/84 | HR 91 | Temp 98.3°F | Ht 60.0 in | Wt 215.8 lb

## 2022-12-25 DIAGNOSIS — R058 Other specified cough: Secondary | ICD-10-CM

## 2022-12-25 DIAGNOSIS — J4531 Mild persistent asthma with (acute) exacerbation: Secondary | ICD-10-CM

## 2022-12-25 MED ORDER — DOXYCYCLINE HYCLATE 100 MG PO TABS: 100.0000 mg | ORAL_TABLET | Freq: Two times a day (BID) | ORAL | 0 refills | Status: DC

## 2022-12-25 MED ORDER — PANTOPRAZOLE SODIUM 40 MG PO TBEC: 40.0000 mg | DELAYED_RELEASE_TABLET | Freq: Every day | ORAL | 2 refills | Status: DC

## 2022-12-25 MED ORDER — ALBUTEROL SULFATE (2.5 MG/3ML) 0.083% IN NEBU: 2.5000 mg | INHALATION_SOLUTION | Freq: Four times a day (QID) | RESPIRATORY_TRACT | 12 refills | Status: DC | PRN

## 2022-12-25 NOTE — Telephone Encounter (Signed)
Seen 12/25/2022 in pulmonary clinic for severe refractory recurrent cough likely from asthmatic bronchitis.

## 2022-12-25 NOTE — Progress Notes (Unsigned)
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           History of Present Illness  11/06/2022  Pulmonary/ 1st office eval/ Kelly Matthews / [facility]  Chief Complaint  Patient presents with   pulmonary consult    C/o prod cough with green mucus, wheezing and SOB with exertion. RVS and PNA 2wk ago.  Dyspnea:  mostly due to clogged up head / never seen by ENT / allergist  Cough: worse when head hit pillow  Sleep: flat bed big pillow SABA use:  has inhalers in past /ventolin listed bu ? Helping  Rec For cough mucinex dm 1200 mg every 12 hours as needed  Try prilosec (omeprazole) otc  Take x 2  30- 60 min before your first and last meals of the day until without the need for cough suppression GERD diet reviewed, bed blocks rec  Omnicef 300 mg twice daily x 10 days  My office will be contacting you by phone for referral to Sinus CT after 10 more days of antibiotics  For wheezing/ short of breath, bad cough > try air supra up 2 puffs every 4 hours as needed   Labs   IgE 66/ Eos 0.1 nl quant Igs  28 phone/ progess notes recorded since previous ov/ multiple providers attempting to treat refractory cough, pt not sure if any really work   12/25/2022  f/u ov/Kelly Matthews/ [facility] re: Upper airway cough syndrome vs asthmatic bronchitis   maint on air supra  / did not bring meds as rec  Chief Complaint  Patient presents with   Follow-up    Cough with green sputum. SOB. Wheezing. Started yesterday.  Dyspnea:  worse 2/8 better p saba this am though hfa  very poor and did not master despite multiple tries using teachback method  Cough: turned green again 2 d prior to [place] / very harsh   Sleeping: cough at hs  worst  SABA use: no more than 4 x daily   02: none    No obvious day to day or daytime variability or assoc  mucus plugs or hemoptysis or cp or chest tightness, subjective wheeze or overt   hb symptoms.     Also denies any obvious fluctuation of symptoms with weather or environmental changes or other aggravating or alleviating factors except as outlined above   No unusual exposure hx or h/o childhood pna/ asthma or knowledge of premature birth.  Current Allergies, Complete Past Medical History, Past Surgical History, Family History, and Social History were reviewed in [employer] record.  ROS  The following are not active complaints unless bolded Hoarseness, sore throat, dysphagia, dental problems, itching, sneezing,  nasal congestion or discharge of excess mucus or purulent secretions, ear ache,   fever, chills, sweats, unintended wt loss or wt gain, classically pleuritic or exertional cp,  orthopnea pnd or arm/hand swelling  or leg swelling, presyncope, palpitations, abdominal pain, anorexia, nausea, vomiting, diarrhea  or  change in bowel habits or change in bladder habits, change in stools or change in urine, dysuria, hematuria,  rash, arthralgias, visual complaints, headache, numbness, weakness or ataxia or problems with walking or coordination,  change in mood or  memory.        Current Meds - - NOTE:   Unable to verify as accurately reflecting what pt takes    Medication Sig   Albuterol-Budesonide (AIRSUPRA) 90-80 MCG/ACT AERO Inhale 2 puffs into the lungs every 4 (four) hours as needed.   atorvastatin (LIPITOR) 10 MG tablet Take 10 mg by mouth daily.   Continuous Blood Gluc Receiver (DEXCOM G6 RECEIVER) DEVI USE FOR CONTINUOUS BLOOD GLUCOSE MONITORING   Continuous Blood Gluc Sensor (DEXCOM G6 SENSOR) MISC USE  FOR CONTINUOUS BLOOD GLUCOSE MONITORING REPLACE SENSOR EVERY 10 DAYS   Continuous Blood Gluc Transmit (DEXCOM G6 TRANSMITTER) MISC USE FOR CONTINUOUS BLOOD GLUCOSE MONITORING REPLACE TRANSMITTER EVERY 90 DAYS   cyclobenzaprine (FLEXERIL) 10 MG tablet TAKE 1 TABLET EVERY DAY BY ORAL ROUTE IN THE EVENING FOR 5 DAYS.   Dapagliflozin-Metformin HCl ER 03-999 MG TB24 Take by mouth.   gabapentin (NEURONTIN) 300 MG capsule TAKE 1 CAPSULE BY MOUTH THREE TIMES A DAY   JARDIANCE 25 MG TABS tablet Take 25 mg by mouth daily.   Lancets (ACCU-CHEK SOFT TOUCH) lancets Use as instructed   meloxicam (MOBIC) 15 MG tablet TAKE 1 TABLET (15 MG TOTAL) BY MOUTH DAILY.   metFORMIN (GLUCOPHAGE) 1000 MG tablet Take 1,000 mg by mouth 2 (two) times daily.   NOVOLOG FLEXPEN 100 UNIT/ML FlexPen INJECT UP TO 60 UNITS DAILY IN DIVIDED DOSES AS DIRECTED   omeprazole (PRILOSEC) 40 MG capsule Take 1 capsule (40 mg total) by mouth daily.   promethazine-dextromethorphan (PROMETHAZINE-DM) 6.25-15 MG/5ML syrup Take 5 mLs by mouth 4 (four) times daily as needed for cough.   sertraline (ZOLOFT) 50 MG tablet TAKE 1 TABLET BY MOUTH EVERY DAY   TOUJEO SOLOSTAR 300 UNIT/ML Solostar Pen INJECT 50 UNITS EVERY DAY   traZODone (DESYREL) 50 MG tablet TAKE 1-2 TABLETS (50-100 MG TOTAL) BY MOUTH AT BEDTIME AS NEEDED (FOR INSOMNIA). 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   Diabetes mellitus without complication (HCC)    Fatty liver    Hypertension    Psoriasis (a type of skin inflammation)    Tachycardia        Objective:     Wts   12/25/2022         215  12/17/22 206 lb (93.4 kg)  12/15/22 192 lb (87.1 kg)  12/10/22 213 lb 12.8 oz (97 kg)      Vital signs reviewed  12/25/2022  - Note at rest 02 sats  98% on RA   General appearance:    amb   MO (by BMI) nad with spont very harsh upper airway sounding cough, occurred only once during ov   HEENT : Oropharynx  clear      Nasal turbinates nl    NECK :  without  apparent JVD/ palpable Nodes/TM    LUNGS: no acc muscle use,  Nl contour chest which is clear to A and P bilaterally without cough on insp or exp maneuvers   CV:  RRR  no s3 or murmur or increase in P2, and no edema   ABD:  soft and nontender with nl inspiratory excursion in the supine  position. No bruits or organomegaly appreciated   MS:  Nl gait/ ext warm without deformities Or obvious joint restrictions  calf tenderness, cyanosis or clubbing    SKIN: warm and dry without lesions    NEURO:  alert, approp, nl sensorium with  no motor or cerebellar deficits apparent.         CTa  07/19/22 no bronchiectasis     Assessment

## 2022-12-25 NOTE — Patient Instructions (Addendum)
Protonix 40 mg  Take 30- 60 min before your first and last meals of the day   Airsupra 2 every 4 hours as needed   For cough /congestion >  mucinex dm 1200 mg every 12 hours  as needed    If can't stop coughing or catch your breath > albuterol 2.5 mg every 4 hours per neb as needed    Doxycycline 100 mg twice daily x 10 days   GERD (REFLUX)  is an extremely common cause of respiratory symptoms just like yours , many times with no obvious heartburn at all.    It can be treated with medication, but also with lifestyle changes including elevation of the head of your bed (ideally with 6 -8inch blocks under the headboard of your bed),  Smoking cessation, avoidance of late meals, excessive alcohol, and avoid fatty foods, chocolate, peppermint, colas, red wine, and acidic juices such as orange juice.  NO MINT OR MENTHOL PRODUCTS SO NO COUGH DROPS  USE SUGARLESS CANDY INSTEAD (Jolley ranchers or Stover's or Life Savers) or even ice chips will also do - the key is to swallow to prevent all throat clearing. NO OIL BASED VITAMINS - use powdered substitutes.  Avoid fish oil when coughing.   Please schedule a follow up office visit in 2 weeks, sooner if needed  with all medications /inhalers/ solutions in hand so we can verify exactly what you are taking. This includes all medications from all doctors and over the counters

## 2022-12-25 NOTE — Telephone Encounter (Signed)
Mychart message sent by pt: Kelly Matthews "Kelly Matthews"  P [facility] (supporting Tanda Rockers, MD)29 minutes ago (2:12 PM)    Hello my employer wants a not from Dr Melvyn Novas saying I was seen today and why for my FMLA. Can you please send me that ASAP thank you      Dr. Melvyn Novas, please advise.

## 2022-12-26 NOTE — Assessment & Plan Note (Signed)
Onset was in her 30's typically triggered by URI  - Quant Ig's 11/06/22  >>> nl  - Allergy screen  11/06/22  >  Eos 0.1/  IgE  66 - Sinus Ct  12/01/22  p 20 days abx >>> rhinitis but no  acute sinusitis (says was not having a typical flare on day of study but w/in 48 h started back with bilateral paranasal discomfort/nasal obst symptoms  - 12/25/2022  After extensive coaching inhaler device,  effectiveness =  close to 0 p approaching 75% on prior eval, neb rx   Continue to strongly favor UACS here over AB.   Advised: The standardized cough guidelines published in Chest by Lissa Morales in 2006 are still the best available and consist of a multiple step process (up to 12!) , not a single office visit,  and are intended  to address this problem logically,  with an alogrithm dependent on response to empiric treatment at  each progressive step  to determine a specific diagnosis with  minimal addtional testing needed. Therefore if adherence is an issue or can't be accurately verified,  it's very unlikely the standard evaluation and treatment will be successful here.    Furthermore, response to therapy (other than acute cough suppression, which should only be used short term with avoidance of narcotic containing cough syrups if possible), can be a gradual process for which the patient is not likely to  perceive immediate benefit.  Unlike going to an eye doctor where the best perscription is almost always the first one and is immediately effective, this is almost never the case in the management of chronic cough syndromes. Therefore the patient needs to commit up front to consistently adhere to recommendations  for up to 6 weeks of therapy directed at the likely underlying problem(s) before the response can be reasonably evaluated.   Rec >>> Max gerd rx >>> Continue air supra prn and use neb albuterol as last resort >>> No more pred or narcotic containing cough meds until able to confirm taking meds  correctly consistently based on concerns of side effects of mixing meds from multiple providers   >>> Rechallenge with doxy as she says this works best  - take x 10 day and f/u in 2 weeks (sooner prn)  with all meds in hand using a trust but verify approach to confirm accurate Medication  Reconciliation The principal here is that until we are certain that the  patients are doing what we've asked, it makes no sense to ask them to do more.          Each maintenance medication was reviewed in detail including emphasizing most importantly the difference between maintenance and prns and under what circumstances the prns are to be triggered using an action plan format where appropriate.  Total time for H and P, chart review, counseling, reviewing hfa /neb device(s) and generating customized AVS unique to this office visit / same day charting  > 40 min for   refractory respiratory  symptoms of uncertain etiology

## 2022-12-29 DIAGNOSIS — J111 Influenza due to unidentified influenza virus with other respiratory manifestations: Secondary | ICD-10-CM | POA: Diagnosis not present

## 2022-12-29 DIAGNOSIS — J101 Influenza due to other identified influenza virus with other respiratory manifestations: Secondary | ICD-10-CM | POA: Diagnosis not present

## 2022-12-29 DIAGNOSIS — R509 Fever, unspecified: Secondary | ICD-10-CM | POA: Diagnosis not present

## 2022-12-29 DIAGNOSIS — Z1152 Encounter for screening for COVID-19: Secondary | ICD-10-CM | POA: Diagnosis not present

## 2022-12-29 DIAGNOSIS — Z87891 Personal history of nicotine dependence: Secondary | ICD-10-CM | POA: Diagnosis not present

## 2022-12-29 DIAGNOSIS — R059 Cough, unspecified: Secondary | ICD-10-CM | POA: Diagnosis not present

## 2022-12-29 DIAGNOSIS — Z20822 Contact with and (suspected) exposure to covid-19: Secondary | ICD-10-CM | POA: Diagnosis not present

## 2022-12-30 ENCOUNTER — Telehealth: Payer: Self-pay | Admitting: Family Medicine

## 2022-12-30 NOTE — Transitions of Care (Post Inpatient/ED Visit) (Signed)
   12/30/2022  Name: ANDREIKA VANDAGRIFF MRN: [id number] DOB: 1966/01/09  Today's TOC FU Call Status: Today's TOC FU Call Status:: Unsuccessul Call (1st Attempt) Unsuccessful Call (1st Attempt) Date: 12/30/22  Attempted to reach the patient regarding the most recent Inpatient/ED visit.  Follow Up Plan: Additional outreach attempts will be made to reach the patient to complete the Transitions of Care (Post Inpatient/ED visit) call.   Signature Rory Percy, [place] 12/30/2022 2:41 PM

## 2023-01-01 LAB — HM DIABETES EYE EXAM

## 2023-01-04 DIAGNOSIS — H5213 Myopia, bilateral: Secondary | ICD-10-CM | POA: Diagnosis not present

## 2023-01-08 ENCOUNTER — Ambulatory Visit: Payer: 59 | Admitting: Internal Medicine

## 2023-01-08 NOTE — Progress Notes (Deleted)
Kelly Matthews, female    DOB: 02-19-1966   MRN: [id number]   Brief patient profile:  77  yowf [employer] with  no significant smoking hx/ heavy  passive smoke exp with sinus problems as child and then  better by HS  until her 93s with tendency to sinus infections p uri's and freq abx need  referred to pulmonary clinic in [facility]  11/06/2022 by  Mardene Speak PA   for f/u pna rx already doxy finished around 10/2022 but still blowing out green mucus from nose and chest.           History of Present Illness  11/06/2022  Pulmonary/ 1st office eval/ Kelly Matthews / [facility]  Chief Complaint  Patient presents with   pulmonary consult    C/o prod cough with green mucus, wheezing and SOB with exertion. RVS and PNA 2wk ago.  Dyspnea:  mostly due to clogged up head / never seen by ENT / allergist  Cough: worse when head hit pillow  Sleep: flat bed big pillow SABA use:  has inhalers in past /ventolin listed bu ? Helping  Rec For cough mucinex dm 1200 mg every 12 hours as needed  Try prilosec (omeprazole) otc  Take x 2  30- 60 min before your first and last meals of the day until without the need for cough suppression GERD diet reviewed, bed blocks rec  Omnicef 300 mg twice daily x 10 days  My office will be contacting you by phone for referral to Sinus CT after 10 more days of antibiotics  For wheezing/ short of breath, bad cough > try air supra up 2 puffs every 4 hours as needed   Labs   IgE 66/ Eos 0.1 nl quant Igs  28 phone/ progess notes recorded since previous ov/ multiple providers attempting to treat refractory cough, pt not sure if any really work   12/25/2022  f/u ov/Kelly Matthews/ [facility] re: Upper airway cough syndrome vs asthmatic bronchitis   maint on air supra  / did not bring meds as rec  Chief Complaint  Patient presents with   Follow-up    Cough with green sputum. SOB. Wheezing. Started yesterday.  Dyspnea:  worse 2/8 better p saba this am though hfa  very poor and did not master despite multiple tries using teachback method  Cough: turned green again 2 d prior to [place] / very harsh   Sleeping: cough at hs  worst  SABA use: no more than 4 x daily   02: none  Rec Protonix 40 mg  Take 30- 60 min before your first and last meals of the day  Airsupra 2 every 4 hours as needed  For cough /congestion >  mucinex dm 1200 mg every 12 hours  as needed   If can't stop coughing or catch your breath > albuterol 2.5 mg every 4 hours per neb as needed   Doxycycline 100 mg twice daily x 10 days  GERD diet reviewed, bed blocks rec  Please schedule a follow up office visit in 2 weeks, sooner if needed  with all medications /inhalers/ solutions in hand so we can verify exactly what you are taking. This includes all medications from all doctors and over the counters    01/08/2023  f/u ov/Kelly Matthews re: ***   maint on ***  No chief complaint on file.   Dyspnea:  *** Cough: *** Sleeping: *** SABA use: *** 02: *** Covid status:   *** Lung cancer  screening :  ***    No obvious day to day or daytime variability or assoc excess/ purulent sputum or mucus plugs or hemoptysis or cp or chest tightness, subjective wheeze or overt sinus or hb symptoms.   *** without nocturnal  or early am exacerbation  of respiratory  c/o's or need for noct saba. Also denies any obvious fluctuation of symptoms with weather or environmental changes or other aggravating or alleviating factors except as outlined above   No unusual exposure hx or h/o childhood pna/ asthma or knowledge of premature birth.  Current Allergies, Complete Past Medical History, Past Surgical History, Family History, and Social History were reviewed in [employer] record.  ROS  The following are not active complaints unless bolded Hoarseness, sore throat, dysphagia, dental problems, itching, sneezing,  nasal congestion or discharge of excess mucus or purulent secretions, ear ache,   fever,  chills, sweats, unintended wt loss or wt gain, classically pleuritic or exertional cp,  orthopnea pnd or arm/hand swelling  or leg swelling, presyncope, palpitations, abdominal pain, anorexia, nausea, vomiting, diarrhea  or change in bowel habits or change in bladder habits, change in stools or change in urine, dysuria, hematuria,  rash, arthralgias, visual complaints, headache, numbness, weakness or ataxia or problems with walking or coordination,  change in mood or  memory.        No outpatient medications have been marked as taking for the 01/08/23 encounter (Appointment) with Tanda Rockers, MD.               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

## 2023-01-25 ENCOUNTER — Emergency Department: Payer: 59

## 2023-01-25 DIAGNOSIS — R0789 Other chest pain: Secondary | ICD-10-CM | POA: Diagnosis not present

## 2023-01-25 DIAGNOSIS — E119 Type 2 diabetes mellitus without complications: Secondary | ICD-10-CM | POA: Insufficient documentation

## 2023-01-25 DIAGNOSIS — I10 Essential (primary) hypertension: Secondary | ICD-10-CM | POA: Diagnosis not present

## 2023-01-25 DIAGNOSIS — R079 Chest pain, unspecified: Secondary | ICD-10-CM | POA: Diagnosis present

## 2023-01-25 LAB — URINALYSIS, ROUTINE W REFLEX MICROSCOPIC
Bacteria, UA: NONE SEEN
Bilirubin Urine: NEGATIVE
Glucose, UA: >=500 mg/dL — AB
Hgb urine dipstick: NEGATIVE
Ketones, ur: NEGATIVE mg/dL
Leukocytes,Ua: NEGATIVE
Nitrite: NEGATIVE
Protein, ur: NEGATIVE mg/dL
Specific Gravity, Urine: 1.027 (ref 1.005–1.030)
pH: 5 (ref 5.0–8.0)

## 2023-01-25 LAB — CBC WITH DIFFERENTIAL/PLATELET
Abs Immature Granulocytes: 0.06 10*3/uL (ref 0.00–0.07)
Basophils Absolute: 0.1 10*3/uL (ref 0.0–0.1)
Basophils Relative: 1 %
Eosinophils Absolute: 0 10*3/uL (ref 0.0–0.5)
Eosinophils Relative: 0 %
HCT: 38 % (ref 36.0–46.0)
Hemoglobin: 12.1 g/dL (ref 12.0–15.0)
Immature Granulocytes: 1 %
Lymphocytes Relative: 16 %
Lymphs Abs: 2 10*3/uL (ref 0.7–4.0)
MCH: 24.6 pg — ABNORMAL LOW (ref 26.0–34.0)
MCHC: 31.8 g/dL (ref 30.0–36.0)
MCV: 77.2 fL — ABNORMAL LOW (ref 80.0–100.0)
Monocytes Absolute: 0.5 10*3/uL (ref 0.1–1.0)
Monocytes Relative: 4 %
Neutro Abs: 9.9 10*3/uL — ABNORMAL HIGH (ref 1.7–7.7)
Neutrophils Relative %: 78 %
Platelets: 209 10*3/uL (ref 150–400)
RBC: 4.92 MIL/uL (ref 3.87–5.11)
RDW: 14.9 % (ref 11.5–15.5)
WBC: 12.6 10*3/uL — ABNORMAL HIGH (ref 4.0–10.5)
nRBC: 0 % (ref 0.0–0.2)

## 2023-01-25 LAB — COMPREHENSIVE METABOLIC PANEL
ALT: 25 U/L (ref 0–44)
AST: 25 U/L (ref 15–41)
Albumin: 4.2 g/dL (ref 3.5–5.0)
Alkaline Phosphatase: 110 U/L (ref 38–126)
Anion gap: 11 (ref 5–15)
BUN: 14 mg/dL (ref 6–20)
CO2: 22 mmol/L (ref 22–32)
Calcium: 9.5 mg/dL (ref 8.9–10.3)
Chloride: 104 mmol/L (ref 98–111)
Creatinine, Ser: 0.81 mg/dL (ref 0.44–1.00)
GFR, Estimated: >60 mL/min (ref >60)
Glucose, Bld: 177 mg/dL — ABNORMAL HIGH (ref 70–99)
Potassium: 3.5 mmol/L (ref 3.5–5.1)
Sodium: 137 mmol/L (ref 135–145)
Total Bilirubin: 0.5 mg/dL (ref 0.3–1.2)
Total Protein: 7.9 g/dL (ref 6.5–8.1)

## 2023-01-25 LAB — TROPONIN I (HIGH SENSITIVITY)
Troponin I (High Sensitivity): 4 ng/L (ref <18)
Troponin I (High Sensitivity): 4 ng/L (ref <18)

## 2023-01-25 NOTE — ED Notes (Signed)
Blue top tube sent to lab with save label at this time

## 2023-01-25 NOTE — ED Triage Notes (Signed)
To triage via wheelchair with c/o chest pain, onset apx 1 hr. Left side, with numbess to left arm and facial flushing. Pt at rest when pain began. Pain sharp, constant. Denies dizziness, N/V, or sob.  Took '81mg'$  ASA pta. Hx ablation 2.5 yrs ago

## 2023-01-26 ENCOUNTER — Emergency Department
Admission: EM | Admit: 2023-01-26 | Discharge: 2023-01-26 | Disposition: A | Discharge status: Home / Self Care | Payer: 59 | Attending: Emergency Medicine | Admitting: Emergency Medicine

## 2023-01-26 ENCOUNTER — Other Ambulatory Visit: Payer: Self-pay

## 2023-01-26 ENCOUNTER — Emergency Department: Payer: 59

## 2023-01-26 DIAGNOSIS — R079 Chest pain, unspecified: Secondary | ICD-10-CM

## 2023-01-26 DIAGNOSIS — R0789 Other chest pain: Secondary | ICD-10-CM | POA: Diagnosis not present

## 2023-01-26 LAB — D-DIMER, QUANTITATIVE: D-Dimer, Quant: 0.58 ug/mL-FEU — ABNORMAL HIGH (ref 0.00–0.50)

## 2023-01-26 MED ORDER — ACETAMINOPHEN 500 MG PO TABS
1000.0000 mg | ORAL_TABLET | Freq: Once | ORAL | Status: AC
  Administered 2023-01-26: 1000 mg via ORAL
  Filled 2023-01-26: qty 2

## 2023-01-26 MED ORDER — IOHEXOL 350 MG/ML SOLN
100.0000 mL | Freq: Once | INTRAVENOUS | Status: AC | PRN
  Administered 2023-01-26: 100 mL via INTRAVENOUS

## 2023-01-26 MED ORDER — IBUPROFEN 400 MG PO TABS: 400.0000 mg | ORAL_TABLET | Freq: Once | ORAL | Status: DC

## 2023-01-26 NOTE — ED Provider Notes (Signed)
[facility] Provider Note    Event Date/Time   First MD Initiated Contact with Patient 01/26/23 0202     (approximate)   History   Chest Pain   HPI  Kelly Matthews is a 57 y.o. female past medical history diabetes, hypertension who presents with chest pain.  Symptoms started around 6:30 PM when patient was just watching TV.  She describes sharp pain in the left side of her chest with some numbness rating down the left arm.  Denies associated dyspnea or nausea did have some diaphoresis.  Pain is nonpleuritic and nonexertional.  Has been rather constant since onset.  Still has some residual pain now but it is improved.  Denies history of similar.  Denies lower extremity swelling. The patient denies hx of prior DVT/PE, unilateral leg pain/swelling, hormone use, recent surgery, hx of cancer, prolonged immobilization, or hemoptysis.  Has had some cough lately.    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   Diabetes mellitus without complication ([place])    Fatty liver    Hypertension    Psoriasis (a type of skin inflammation)    Tachycardia     Patient Active Problem List   Diagnosis Date Noted   Upper airway cough syndrome vs asthmatic bronchitis 11/06/2022   Bright red rectal bleeding 08/25/2021   Class 3 severe obesity in adult [facility]) 11/04/2020   History of colonic polyps    Chronic bilateral low back pain with left-sided sciatica 03/20/2020   Muscle spasm of back 02/05/2020   Muscle cramp 02/05/2020   Left hip pain 02/05/2020   Psoriasis 03/13/2019   Stomach irritation    Abdominal pain, epigastric    Special screening for malignant neoplasms, colon    Polyp of descending colon    Right leg pain 02/24/2018   Women's annual routine gynecological examination 05/14/2017   Malnutrition of moderate degree 12/07/2016   DKA (diabetic ketoacidoses) 12/05/2016   Migraine headache 05/05/2016   Allergic rhinitis  07/23/2015   Excess, menstruation 07/19/2015   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   Blood glucose elevated 07/19/2015   Airway hyperreactivity 07/19/2015   Disorder of esophagus 07/19/2015   Hypercholesterolemia without hypertriglyceridemia 07/19/2015   Borderline diabetes 07/19/2015   Diabetes ([place]) 11/28/2014   Essential (primary) hypertension 11/28/2014   Aortic heart valve narrowing 11/28/2014   Combined fat and carbohydrate induced hyperlipemia 11/28/2014   Supraventricular tachycardia ([place]) 11/28/2014   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 --      Excl. in [place]? --     Most recent vital signs: Vitals:   01/26/23 0155 01/26/23 0157  BP: 130/67   Pulse: 90   Resp: 20  Temp:  98.4 F (36.9 C)  SpO2: 96%      General: Awake, no distress.  CV:  Good peripheral perfusion.  No peripheral edema legs are symmetric Resp:  Normal effort.  Lungs are clear Abd:  No distention.  Neuro:             Awake, Alert, Oriented x 3  Other:     ED Results / Procedures / Treatments  Labs (all labs ordered are listed, but only abnormal results are displayed) Labs Reviewed  URINALYSIS, ROUTINE W REFLEX MICROSCOPIC - Abnormal; Notable for the following components:      Result Value   Color, Urine STRAW (*)    APPearance CLEAR (*)    Glucose, UA >=500 (*)    All other components within normal limits  CBC WITH DIFFERENTIAL/PLATELET - Abnormal; Notable for the following components:   WBC 12.6 (*)    MCV 77.2  (*)    MCH 24.6 (*)    Neutro Abs 9.9 (*)    All other components within normal limits  COMPREHENSIVE METABOLIC PANEL - Abnormal; Notable for the following components:   Glucose, Bld 177 (*)    All other components within normal limits  D-DIMER, QUANTITATIVE - Abnormal; Notable for the following components:   D-Dimer, Quant 0.58 (*)    All other components within normal limits  TROPONIN I (HIGH SENSITIVITY)  TROPONIN I (HIGH SENSITIVITY)     EKG  EKG reviewed interpreted myself shows sinus tachycardia normal axis normal intervals, poor R wave progression, Q wave V2   RADIOLOGY I reviewed and interpreted the CXR which does not show any acute cardiopulmonary process    PROCEDURES:  Critical Care performed: No  Procedures  The patient is on the cardiac monitor to evaluate for evidence of arrhythmia and/or significant heart rate changes.   MEDICATIONS ORDERED IN ED: Medications  acetaminophen (TYLENOL) tablet 1,000 mg (1,000 mg Oral Given 01/26/23 0233)  iohexol (OMNIPAQUE) 350 MG/ML injection 100 mL (100 mLs Intravenous Contrast Given 01/26/23 0318)     IMPRESSION / MDM / ASSESSMENT AND PLAN / ED COURSE  I reviewed the triage vital signs and the nursing notes.                              Patient's presentation is most consistent with acute presentation with potential threat to life or bodily function.  Differential diagnosis includes, but is not limited to, ACS, pulmonary embolism, pleurisy, pneumothorax, less likely aortic dissection, musculoskeletal pain, GERD/GI related pain  Patient is a 57 year old female who presents with atypical chest pain that started around 6:30 PM.  Pain is sharp with some numbness in the left arm it is nonexertional nonpleuritic.  She is tachycardic on arrival this does normalize without intervention.  EKG shows poor R wave progression with some artifact, will repeat.  Chest x-ray is clear.  Troponins x 2 are negative.  Patient's initial  heart rate is elevated given her age cannot apply PERC criteria.  Will obtain D-dimer as pain is somewhat pleuritic on my exam and do not have other explanation for pain.  It is less typical for ACS although she does have risk factors but given 2 negative troponins with atypical pain feel that she can likely follow-up as an outpatient if D-dimer rest of her workup is reassuring.  Will give Motrin for pain.  Patient's first EKG had significant artifact on repeat its much easier to interpret  no ischemic changes.  D-dimer is mildly elevated CTA obtained is negative for pulmonary embolism.  With 2 negative troponins and no EKG changes and negative CTA feel that patient can safely be discharged.  Did recommend that she follow-up with primary care doctor.  She still has some ongoing pain which I think is less likely to be cardiac but I did discuss with her that if pain is intensifying or becomes exertional that she return to the emergency department.  Clinical Course as of 01/26/23 0343  Tue Jan 26, 2023  0216 DG Chest Portable 1 View [KM]    Clinical Course User Index [KM] Rada Hay, MD     FINAL CLINICAL IMPRESSION(S) / ED DIAGNOSES   Final diagnoses:  Chest pain, unspecified type     Rx / DC Orders   ED Discharge Orders     None        Note:  This document was prepared using Dragon voice recognition software and may include unintentional dictation errors.   Rada Hay, MD 01/26/23 [phone]

## 2023-01-26 NOTE — Discharge Instructions (Addendum)
Your EKG and blood work and your CAT scan to screen for blood clots was all reassuring.  If your pain is changing or worsening or it is worsening when you exercise or walk around please return to the emergency department.  Otherwise please follow-up with your primary care doctor.

## 2023-03-10 NOTE — Telephone Encounter (Signed)
sign

## 2023-03-12 ENCOUNTER — Emergency Department: Payer: 59

## 2023-03-12 ENCOUNTER — Emergency Department
Admission: EM | Admit: 2023-03-12 | Discharge: 2023-03-12 | Disposition: A | Discharge status: Home / Self Care | Payer: 59 | Attending: Emergency Medicine | Admitting: Emergency Medicine

## 2023-03-12 ENCOUNTER — Other Ambulatory Visit: Payer: Self-pay

## 2023-03-12 DIAGNOSIS — I10 Essential (primary) hypertension: Secondary | ICD-10-CM | POA: Diagnosis not present

## 2023-03-12 DIAGNOSIS — R1013 Epigastric pain: Secondary | ICD-10-CM | POA: Diagnosis not present

## 2023-03-12 DIAGNOSIS — J069 Acute upper respiratory infection, unspecified: Secondary | ICD-10-CM | POA: Insufficient documentation

## 2023-03-12 DIAGNOSIS — R059 Cough, unspecified: Secondary | ICD-10-CM | POA: Diagnosis present

## 2023-03-12 DIAGNOSIS — E119 Type 2 diabetes mellitus without complications: Secondary | ICD-10-CM | POA: Diagnosis not present

## 2023-03-12 DIAGNOSIS — Z20822 Contact with and (suspected) exposure to covid-19: Secondary | ICD-10-CM | POA: Diagnosis not present

## 2023-03-12 DIAGNOSIS — R112 Nausea with vomiting, unspecified: Secondary | ICD-10-CM

## 2023-03-12 LAB — CBC
HCT: 37.3 % (ref 36.0–46.0)
Hemoglobin: 11.6 g/dL — ABNORMAL LOW (ref 12.0–15.0)
MCH: 23.4 pg — ABNORMAL LOW (ref 26.0–34.0)
MCHC: 31.1 g/dL (ref 30.0–36.0)
MCV: 75.2 fL — ABNORMAL LOW (ref 80.0–100.0)
Platelets: 160 10*3/uL (ref 150–400)
RBC: 4.96 MIL/uL (ref 3.87–5.11)
RDW: 14.9 % (ref 11.5–15.5)
WBC: 8.7 10*3/uL (ref 4.0–10.5)
nRBC: 0 % (ref 0.0–0.2)

## 2023-03-12 LAB — COMPREHENSIVE METABOLIC PANEL
ALT: 24 U/L (ref 0–44)
AST: 21 U/L (ref 15–41)
Albumin: 3.8 g/dL (ref 3.5–5.0)
Alkaline Phosphatase: 68 U/L (ref 38–126)
Anion gap: 9 (ref 5–15)
BUN: 12 mg/dL (ref 6–20)
CO2: 21 mmol/L — ABNORMAL LOW (ref 22–32)
Calcium: 8.8 mg/dL — ABNORMAL LOW (ref 8.9–10.3)
Chloride: 107 mmol/L (ref 98–111)
Creatinine, Ser: 0.57 mg/dL (ref 0.44–1.00)
GFR, Estimated: >60 mL/min (ref >60)
Glucose, Bld: 188 mg/dL — ABNORMAL HIGH (ref 70–99)
Potassium: 3.3 mmol/L — ABNORMAL LOW (ref 3.5–5.1)
Sodium: 137 mmol/L (ref 135–145)
Total Bilirubin: 0.9 mg/dL (ref 0.3–1.2)
Total Protein: 7.2 g/dL (ref 6.5–8.1)

## 2023-03-12 LAB — URINALYSIS, ROUTINE W REFLEX MICROSCOPIC
Bilirubin Urine: NEGATIVE
Glucose, UA: >=500 mg/dL — AB
Hgb urine dipstick: NEGATIVE
Ketones, ur: NEGATIVE mg/dL
Leukocytes,Ua: NEGATIVE
Nitrite: NEGATIVE
Protein, ur: NEGATIVE mg/dL
Specific Gravity, Urine: 1.031 — ABNORMAL HIGH (ref 1.005–1.030)
pH: 5 (ref 5.0–8.0)

## 2023-03-12 LAB — RESP PANEL BY RT-PCR (FLU A&B, COVID) ARPGX2
Influenza A by PCR: NEGATIVE
Influenza B by PCR: NEGATIVE
SARS Coronavirus 2 by RT PCR: NEGATIVE

## 2023-03-12 LAB — LIPASE, BLOOD: Lipase: 27 U/L (ref 11–51)

## 2023-03-12 LAB — POC URINE PREG, ED: Preg Test, Ur: NEGATIVE

## 2023-03-12 MED ORDER — IOHEXOL 300 MG/ML  SOLN
100.0000 mL | Freq: Once | INTRAMUSCULAR | Status: AC | PRN
  Administered 2023-03-12: 100 mL via INTRAVENOUS

## 2023-03-12 MED ORDER — SODIUM CHLORIDE 0.9 % IV BOLUS
500.0000 mL | Freq: Once | INTRAVENOUS | Status: AC
  Administered 2023-03-12: 500 mL via INTRAVENOUS

## 2023-03-12 MED ORDER — IBUPROFEN 600 MG PO TABS
600.0000 mg | ORAL_TABLET | Freq: Once | ORAL | Status: AC
  Administered 2023-03-12: 600 mg via ORAL
  Filled 2023-03-12: qty 1

## 2023-03-12 MED ORDER — ONDANSETRON 4 MG PO TBDP: 4.0000 mg | ORAL_TABLET | Freq: Four times a day (QID) | ORAL | 0 refills | Status: DC | PRN

## 2023-03-12 MED ORDER — POTASSIUM CHLORIDE 20 MEQ PO PACK
40.0000 meq | PACK | ORAL | Status: AC
  Administered 2023-03-12: 40 meq via ORAL
  Filled 2023-03-12: qty 2

## 2023-03-12 MED ORDER — ACETAMINOPHEN 325 MG PO TABS
650.0000 mg | ORAL_TABLET | Freq: Once | ORAL | Status: AC
  Administered 2023-03-12: 650 mg via ORAL
  Filled 2023-03-12: qty 2

## 2023-03-12 NOTE — ED Provider Notes (Signed)
[facility] Provider Note    Event Date/Time   First MD Initiated Contact with Patient 03/12/23 [phone]     (approximate)   History   Abdominal Pain   HPI  Kelly Matthews is a 57 y.o. female  history of diabetes, hypertension  Patient reports that she has had several recent upper respiratory infections with cough and runny nose.  She has been having a slight cough and nasal congestion for about 2 weeks, and reports that his not unusual.  She is diabetic, her blood sugars have been just slightly higher than typical.  She does use insulin and Jardiance  Yesterday she started having a pain in her mid to upper abdomen.  Associated with nausea.  She feels as though it is food poisoning.  Started suddenly after eating at [facility].  No diarrhea.  She has had some nausea and decreased appetite.  She did not have much to eat today but has been drinking juice similar to like Gatorade or water without vomiting    No chest pain.  No trouble breathing.  She has not a cough for about 2 weeks ago.  No wheezing.  No black or bloody stools.  No pain or burning with urination  Physical Exam   Triage Vital Signs: ED Triage Vitals  Enc Vitals Group     BP 03/12/23 0842 (!) 149/87     Pulse Rate 03/12/23 0842 (!) 109     Resp 03/12/23 0842 18     Temp 03/12/23 0842 97.8 F (36.6 C)     Temp src --      SpO2 03/12/23 0842 98 %     Weight --      Height --      Head Circumference --      Peak Flow --      Pain Score 03/12/23 0841 8     Pain Loc --      Pain Edu? --      Excl. in GC? --     Most recent vital signs: Vitals:   03/12/23 1136 03/12/23 1256  BP:    Pulse: 85 85  Resp:  17  Temp:    SpO2:  98%     General: Awake, no distress.  Very pleasant. Occasional slight dry cough without evidence of dyspnea.  Her work of breathing is normal. CV:  Good peripheral perfusion.  Normal tones and rate Resp:  Normal effort.  Clear lungs  bilaterally Abd:  No distention.  Soft, reports mild tenderness to palpation across the epigastrium and right upper quadrant without rebound or guarding.  No lower abdominal pain. Other:     ED Results / Procedures / Treatments   Labs (all labs ordered are listed, but only abnormal results are displayed) Labs Reviewed  COMPREHENSIVE METABOLIC PANEL - Abnormal; Notable for the following components:      Result Value   Potassium 3.3 (*)    CO2 21 (*)    Glucose, Bld 188 (*)    Calcium 8.8 (*)    All other components within normal limits  CBC - Abnormal; Notable for the following components:   Hemoglobin 11.6 (*)    MCV 75.2 (*)    MCH 23.4 (*)    All other components within normal limits  URINALYSIS, ROUTINE W REFLEX MICROSCOPIC - Abnormal; Notable for the following components:   Color, Urine YELLOW (*)    APPearance CLEAR (*)    Specific Gravity, Urine 1.031 (*)  Glucose, UA >=500 (*)    Bacteria, UA RARE (*)    All other components within normal limits  RESP PANEL BY RT-PCR (FLU A&B, COVID) ARPGX2  LIPASE, BLOOD  POC URINE PREG, ED   RADIOLOGY  Right upper quadrant imaging interpreted by me as negative for acute gallstone   CT ABDOMEN PELVIS W CONTRAST  Result Date: 03/12/2023 CLINICAL DATA:  Abdominal pain for 1 day, some nausea. EXAM: CT ABDOMEN AND PELVIS WITH CONTRAST TECHNIQUE: Multidetector CT imaging of the abdomen and pelvis was performed using the standard protocol following bolus administration of intravenous contrast. RADIATION DOSE REDUCTION: This exam was performed according to the departmental dose-optimization program which includes automated exposure control, adjustment of the mA and/or kV according to patient size and/or use of iterative reconstruction technique. CONTRAST:  OMNIPAQUE IOHEXOL 300 MG/ML  SOLN COMPARISON:  Same-day right upper quadrant ultrasound. FINDINGS: Lower chest: The lung bases are clear. The imaged heart is unremarkable.  Hepatobiliary: The liver is diffusely hypoattenuating consistent with fatty infiltration. There is a small calcified granuloma in the right hepatic lobe. There are no suspicious parenchymal lesions. The gallbladder is unremarkable. There is no biliary ductal dilatation. Pancreas: Unremarkable. Spleen: The spleen is enlarged measuring up to 12.5 cm cc and 15.0 cm AP. There are no focal lesions. Adrenals/Urinary Tract: The adrenals are unremarkable. The kidneys are unremarkable, with no focal lesion, stone, hydronephrosis, or hydroureter. The bladder is unremarkable. There is symmetric excretion of contrast into the collecting systems on the delayed images. Stomach/Bowel: The stomach is unremarkable. There is no evidence of bowel obstruction. There is no abnormal bowel wall thickening or inflammatory change. The appendix is normal. Vascular/Lymphatic: There is mild calcified plaque in the nonaneurysmal abdominal aorta. The major branch vessels are patent. The main portal and splenic veins are patent. There is no abdominopelvic lymphadenopathy. Reproductive: The uterus and adnexa are unremarkable. Other: There is no ascites or free air. Musculoskeletal: There is no acute osseous abnormality or suspicious osseous lesion. IMPRESSION: 1. No acute finding in the abdomen or pelvis to explain the patient's symptoms. 2. Fatty infiltration of the liver and splenomegaly. Electronically Signed   By: Lesia Hausen M.D.   On: 03/12/2023 10:56   US ABDOMEN LIMITED RUQ (LIVER/GB)  Result Date: 03/12/2023 CLINICAL DATA:  Pain EXAM: ULTRASOUND ABDOMEN LIMITED RIGHT UPPER QUADRANT COMPARISON:  None Available. FINDINGS: Gallbladder: No gallstones or wall thickening visualized. No sonographic Murphy sign noted by sonographer. Common bile duct: Diameter: 4 mm Liver: Diffusely echogenic hepatic parenchyma consistent with fatty liver infiltration. With this level of echogenicity evaluation for underlying mass lesion is limited and if  needed follow-up contrast CT or MRI as clinically directed. portal vein is patent on color Doppler imaging with normal direction of blood flow towards the liver. Other: None. IMPRESSION: Fatty liver infiltration. No gallstones or ductal dilatation Electronically Signed   By: Karen Kays M.D.   On: 03/12/2023 10:19   DG Chest 2 View  Result Date: 03/12/2023 CLINICAL DATA:  Cough for 2 weeks EXAM: CHEST - 2 VIEW COMPARISON:  X-ray 01/25/2023 and older. CT angiogram chest 01/26/2023. FINDINGS: No consolidation, pneumothorax or effusion. Normal cardiopericardial silhouette without edema. Degenerative changes of the thoracic spine along view. Fixation hardware along the lower cervical spine at the edge of the imaging field. IMPRESSION: No acute cardiopulmonary disease Electronically Signed   By: Karen Kays M.D.   On: 03/12/2023 10:17        PROCEDURES:  Critical Care performed: No  Procedures   MEDICATIONS ORDERED IN ED: Medications  ibuprofen (ADVIL) tablet 600 mg (600 mg Oral Given 03/12/23 1017)  acetaminophen (TYLENOL) tablet 650 mg (650 mg Oral Given 03/12/23 1017)  sodium chloride 0.9 % bolus 500 mL (0 mLs Intravenous Stopped 03/12/23 1256)  iohexol (OMNIPAQUE) 300 MG/ML solution 100 mL (100 mLs Intravenous Contrast Given 03/12/23 1023)  potassium chloride (KLOR-CON) packet 40 mEq (40 mEq Oral Given 03/12/23 1237)     IMPRESSION / MDM / ASSESSMENT AND PLAN / ED COURSE  I reviewed the triage vital signs and the nursing notes.                              Differential diagnosis includes, but is not limited to, biliary colic, pancreatitis, diverticulitis, gastroenteritis, gastritis, etc.  No associated lower abdominal pain.  No associated chest pain or dyspnea.  Additionally she has symptoms of cough and mild coryza, possible upper respiratory infection.  She is overall well-appearing nontoxic  Differential diagnosis includes but is not limited to, abdominal perforation, aortic  dissection, cholecystitis, appendicitis, diverticulitis, colitis, esophagitis/gastritis, kidney stone, pyelonephritis, urinary tract infection, aortic aneurysm. All are considered in decision and treatment plan. Based upon the patient's presentation and risk factors, given the patient is diabetic with nausea and decreased appetite and tenderness in the right upper abdomen proceed with right upper quadrant ultrasound.  Reviewed right upper quadrant ultrasound which is negative, proceed to CT scan to evaluate for further acute intra-abdominal pathology.   Patient's presentation is most consistent with acute complicated illness / injury requiring diagnostic workup.    Labs remarkable for negative flu and COVID testing negative pregnancy test.  Urinalysis without evidence of acute infection.  CBC mild anemia and comprehensive metabolic panel shows mild hypokalemia, slightly elevated glucose known diabetic.  No elevation of anion gap.  No findings suggestive of DKA   Patient feeling improved, no further nausea.  She is resting comfortably at this time.  Her workup quite reassuring.  Suspect at this point likely had a self-limited causation for her nausea and abdominal symptoms.  No cardiac or pulmonary symptoms.  Resting comfortably at this time comfortable plan for discharge and careful return precautions discussed.  Also suspect it may be a mild element of viral illness involved given her recent upper respiratory symptoms, clear chest x-ray reassuring examination without evidence of pneumonia.   Return precautions and treatment recommendations and follow-up discussed with the patient who is agreeable with the plan.   FINAL CLINICAL IMPRESSION(S) / ED DIAGNOSES   Final diagnoses:  Nausea and vomiting, unspecified vomiting type  Upper respiratory tract infection, unspecified type     Rx / DC Orders   ED Discharge Orders          Ordered    ondansetron (ZOFRAN-ODT) 4 MG disintegrating tablet   Every 6 hours PRN        03/12/23 1216             Note:  This document was prepared using Dragon voice recognition software and may include unintentional dictation errors.   Sharyn Creamer, MD 03/12/23 [phone]

## 2023-03-12 NOTE — ED Triage Notes (Signed)
Pt comes with c/o belly pain since yesterday and some nausea. Pt states it might be food poisoning. Pt also states cough and cold symptoms.

## 2023-03-12 NOTE — Discharge Instructions (Addendum)
? ?  Please return to the emergency room right away if you are to develop a fever, severe nausea, your pain becomes severe or worsens, you are unable to keep food down, begin vomiting any dark or bloody fluid, you develop any dark or bloody stools, feel dehydrated, or other new concerns or symptoms arise. ? ?

## 2023-03-22 ENCOUNTER — Other Ambulatory Visit: Payer: Self-pay | Admitting: Physician Assistant

## 2023-03-22 DIAGNOSIS — G8929 Other chronic pain: Secondary | ICD-10-CM

## 2023-03-23 NOTE — Telephone Encounter (Signed)
Requested Prescriptions  Pending Prescriptions Disp Refills   meloxicam (MOBIC) 15 MG tablet [Pharmacy Med Name: MELOXICAM 15 MG TABLET] 90 tablet 0    Sig: TAKE 1 TABLET (15 MG TOTAL) BY MOUTH DAILY.     Analgesics:  COX2 Inhibitors Failed - 03/22/2023  1:51 AM      Failed - Manual Review: Labs are only required if the patient has taken medication for more than 8 weeks.      Failed - HGB in normal range and within 360 days    Hemoglobin  Date Value Ref Range Status  03/12/2023 11.6 (L) 12.0 - 15.0 g/dL Final  16/08/9603 54.0 11.1 - 15.9 g/dL Final         Passed - Cr in normal range and within 360 days    Creatinine, Ser  Date Value Ref Range Status  03/12/2023 0.57 0.44 - 1.00 mg/dL Final   Creatinine, POC  Date Value Ref Range Status  08/06/2017 NA mg/dL Final         Passed - HCT in normal range and within 360 days    HCT  Date Value Ref Range Status  03/12/2023 37.3 36.0 - 46.0 % Final   Hematocrit  Date Value Ref Range Status  12/17/2022 42.3 34.0 - 46.6 % Final         Passed - AST in normal range and within 360 days    AST  Date Value Ref Range Status  03/12/2023 21 15 - 41 U/L Final         Passed - ALT in normal range and within 360 days    ALT  Date Value Ref Range Status  03/12/2023 24 0 - 44 U/L Final         Passed - eGFR is 30 or above and within 360 days    GFR calc Af Amer  Date Value Ref Range Status  01/07/2021 122 >59 mL/min/1.73 Final    Comment:    **In accordance with recommendations from the NKF-ASN Task force,**   [facility] is in the process of updating its eGFR calculation to the   2021 CKD-EPI creatinine equation that estimates kidney function   without a race variable.    GFR, Estimated  Date Value Ref Range Status  03/12/2023 >60 >60 mL/min Final    Comment:    (NOTE) Calculated using the CKD-EPI Creatinine Equation (2021)    eGFR  Date Value Ref Range Status  12/17/2022 102 >59 mL/min/1.73 Final         Passed - Patient  is not pregnant      Passed - Valid encounter within last 12 months    Recent Outpatient Visits           3 months ago Persistent cough   [place] [facility] [place], [place], PA-C   8 months ago Anxiety   [place] [facility] [place], [place], PA-C   8 months ago Acute sinusitis, recurrence not specified, unspecified location   [facility] Caro Laroche, DO   10 months ago Leg swelling   [place] [facility] [place], [place], PA-C   1 year ago Psoriasis   [place] [facility] [place], [place], [place]

## 2023-03-28 ENCOUNTER — Other Ambulatory Visit: Payer: Self-pay | Admitting: Physician Assistant

## 2023-03-28 DIAGNOSIS — G8929 Other chronic pain: Secondary | ICD-10-CM

## 2023-03-29 NOTE — Telephone Encounter (Signed)
Requested Prescriptions  Pending Prescriptions Disp Refills   gabapentin (NEURONTIN) 300 MG capsule [Pharmacy Med Name: GABAPENTIN 300 MG CAPSULE] 270 capsule 1    Sig: TAKE 1 CAPSULE BY MOUTH THREE TIMES A DAY     Neurology: Anticonvulsants - gabapentin Passed - 03/28/2023  9:12 PM      Passed - Cr in normal range and within 360 days    Creatinine, Ser  Date Value Ref Range Status  03/12/2023 0.57 0.44 - 1.00 mg/dL Final   Creatinine, POC  Date Value Ref Range Status  08/06/2017 NA mg/dL Final         Passed - Completed PHQ-2 or PHQ-9 in the last 360 days      Passed - Valid encounter within last 12 months    Recent Outpatient Visits           3 months ago Persistent cough   [place] [facility] [place], [place], PA-C   8 months ago Anxiety   [place] [facility] [place], [place], PA-C   9 months ago Acute sinusitis, recurrence not specified, unspecified location   [facility] Caro Lar[place], DO   11 months ago Leg swelling   [place] [facility] [place], [place], PA-C   1 year ago Psoriasis   [facility] [place], [place], [place]

## 2023-04-15 ENCOUNTER — Other Ambulatory Visit: Payer: Self-pay | Admitting: Physician Assistant

## 2023-04-15 DIAGNOSIS — G4700 Insomnia, unspecified: Secondary | ICD-10-CM

## 2023-04-15 NOTE — Telephone Encounter (Signed)
Last refill: 10/26/2022 qty 180 with 1 refill  Last OV: 12/17/2022 No future OV scheduled

## 2023-05-01 ENCOUNTER — Other Ambulatory Visit: Payer: Self-pay | Admitting: Physician Assistant

## 2023-05-01 DIAGNOSIS — F419 Anxiety disorder, unspecified: Secondary | ICD-10-CM

## 2023-05-03 NOTE — Telephone Encounter (Signed)
Requested Prescriptions  Pending Prescriptions Disp Refills   sertraline (ZOLOFT) 50 MG tablet [Pharmacy Med Name: SERTRALINE HCL 50 MG TABLET] 90 tablet 0    Sig: TAKE 1 TABLET BY MOUTH EVERY DAY     Psychiatry:  Antidepressants - SSRI - sertraline Passed - 05/01/2023  5:54 PM      Passed - AST in normal range and within 360 days    AST  Date Value Ref Range Status  03/12/2023 21 15 - 41 U/L Final         Passed - ALT in normal range and within 360 days    ALT  Date Value Ref Range Status  03/12/2023 24 0 - 44 U/L Final         Passed - Completed PHQ-2 or PHQ-9 in the last 360 days      Passed - Valid encounter within last 6 months    Recent Outpatient Visits           4 months ago Persistent cough   [place] [facility] [place], [place], PA-C   9 months ago Anxiety   [place] [facility] [place], [place], PA-C   10 months ago Acute sinusitis, recurrence not specified, unspecified location   [facility] [place], Darl [place], DO   1 year ago Leg swelling   [place] [facility] [place], [place], PA-C   1 year ago Psoriasis   [facility] [place], [place], [place]

## 2023-05-13 DIAGNOSIS — E1129 Type 2 diabetes mellitus with other diabetic kidney complication: Secondary | ICD-10-CM | POA: Diagnosis not present

## 2023-05-13 DIAGNOSIS — R809 Proteinuria, unspecified: Secondary | ICD-10-CM | POA: Diagnosis not present

## 2023-05-13 DIAGNOSIS — G629 Polyneuropathy, unspecified: Secondary | ICD-10-CM | POA: Diagnosis not present

## 2023-06-30 ENCOUNTER — Other Ambulatory Visit: Payer: Self-pay | Admitting: Physician Assistant

## 2023-06-30 ENCOUNTER — Other Ambulatory Visit: Payer: Self-pay | Admitting: Student in an Organized Health Care Education/Training Program

## 2023-06-30 DIAGNOSIS — R058 Other specified cough: Secondary | ICD-10-CM

## 2023-06-30 DIAGNOSIS — M5442 Lumbago with sciatica, left side: Secondary | ICD-10-CM

## 2023-06-30 DIAGNOSIS — J069 Acute upper respiratory infection, unspecified: Secondary | ICD-10-CM

## 2023-07-07 ENCOUNTER — Other Ambulatory Visit: Payer: Self-pay | Admitting: Physician Assistant

## 2023-07-07 DIAGNOSIS — Z03818 Encounter for observation for suspected exposure to other biological agents ruled out: Secondary | ICD-10-CM | POA: Diagnosis not present

## 2023-07-07 DIAGNOSIS — F419 Anxiety disorder, unspecified: Secondary | ICD-10-CM

## 2023-08-10 ENCOUNTER — Telehealth: Payer: Managed Care, Other (non HMO) | Admitting: Physician Assistant

## 2023-08-10 DIAGNOSIS — M5442 Lumbago with sciatica, left side: Secondary | ICD-10-CM

## 2023-08-10 DIAGNOSIS — M5441 Lumbago with sciatica, right side: Secondary | ICD-10-CM

## 2023-08-11 MED ORDER — CYCLOBENZAPRINE HCL 10 MG PO TABS
10.0000 mg | ORAL_TABLET | Freq: Three times a day (TID) | ORAL | 0 refills | Status: DC | PRN
Start: 2023-08-11 — End: 2023-10-26

## 2023-08-11 NOTE — Progress Notes (Signed)

## 2023-08-11 NOTE — Progress Notes (Signed)
I have spent 5 minutes in review of e-visit questionnaire, review and updating patient chart, medical decision making and response to patient.   Mia Milan Cody Jacklynn Dehaas, PA-C    

## 2023-08-12 DIAGNOSIS — E1129 Type 2 diabetes mellitus with other diabetic kidney complication: Secondary | ICD-10-CM | POA: Diagnosis not present

## 2023-08-12 DIAGNOSIS — E1165 Type 2 diabetes mellitus with hyperglycemia: Secondary | ICD-10-CM | POA: Diagnosis not present

## 2023-08-12 DIAGNOSIS — R809 Proteinuria, unspecified: Secondary | ICD-10-CM | POA: Diagnosis not present

## 2023-08-23 ENCOUNTER — Telehealth: Payer: Managed Care, Other (non HMO) | Admitting: Emergency Medicine

## 2023-08-23 DIAGNOSIS — J329 Chronic sinusitis, unspecified: Secondary | ICD-10-CM

## 2023-08-23 MED ORDER — DOXYCYCLINE HYCLATE 100 MG PO CAPS: 100.0000 mg | ORAL_CAPSULE | Freq: Two times a day (BID) | ORAL | 0 refills | Status: DC

## 2023-08-23 NOTE — Progress Notes (Signed)
E-Visit for Sinus Problems ° °We are sorry that you are not feeling well.  Here is how we plan to help! ° °Based on what you have shared with me it looks like you have sinusitis.  Sinusitis is inflammation and infection in the sinus cavities of the head.  Based on your presentation I believe you most likely have Acute Bacterial Sinusitis.  This is an infection caused by bacteria and is treated with antibiotics. I have prescribed Doxycycline 100mg by mouth twice a day for 10 days. You may use an oral decongestant such as Mucinex D or if you have glaucoma or high blood pressure use plain Mucinex. Saline nasal spray help and can safely be used as often as needed for congestion.  If you develop worsening sinus pain, fever or notice severe headache and vision changes, or if symptoms are not better after completion of antibiotic, please schedule an appointment with a health care provider.   ° °Sinus infections are not as easily transmitted as other respiratory infection, however we still recommend that you avoid close contact with loved ones, especially the very young and elderly.  Remember to wash your hands thoroughly throughout the day as this is the number one way to prevent the spread of infection! ° °Home Care: °Only take medications as instructed by your medical team. °Complete the entire course of an antibiotic. °Do not take these medications with alcohol. °A steam or ultrasonic humidifier can help congestion.  You can place a towel over your head and breathe in the steam from hot water coming from a faucet. °Avoid close contacts especially the very young and the elderly. °Cover your mouth when you cough or sneeze. °Always remember to wash your hands. ° °Get Help Right Away If: °You develop worsening fever or sinus pain. °You develop a severe head ache or visual changes. °Your symptoms persist after you have completed your treatment plan. ° °Make sure you °Understand these instructions. °Will watch your  condition. °Will get help right away if you are not doing well or get worse. ° °Thank you for choosing an e-visit. ° °Your e-visit answers were reviewed by a board certified advanced clinical practitioner to complete your personal care plan. Depending upon the condition, your plan could have included both over the counter or prescription medications. ° °Please review your pharmacy choice. Make sure the pharmacy is open so you can pick up prescription now. If there is a problem, you may contact your provider through MyChart messaging and have the prescription routed to another pharmacy.  Your safety is important to us. If you have drug allergies check your prescription carefully.  ° °For the next 24 hours you can use MyChart to ask questions about today's visit, request a non-urgent call back, or ask for a work or school excuse. °You will get an email in the next two days asking about your experience. I hope that your e-visit has been valuable and will speed your recovery. ° °Approximately 5 minutes was used in reviewing the patient's chart, questionnaire, prescribing medications, and documentation. ° °

## 2023-09-06 DIAGNOSIS — M2550 Pain in unspecified joint: Secondary | ICD-10-CM | POA: Diagnosis not present

## 2023-09-23 ENCOUNTER — Telehealth: Payer: Managed Care, Other (non HMO) | Admitting: Physician Assistant

## 2023-09-23 DIAGNOSIS — R1084 Generalized abdominal pain: Secondary | ICD-10-CM

## 2023-09-23 NOTE — Progress Notes (Signed)
Virtual Visit Consent   Kelly Matthews, you are scheduled for a virtual visit with a  provider today. Just as with appointments in the office, your consent must be obtained to participate. Your consent will be active for this visit and any virtual visit you may have with one of our providers in the next 365 days. If you have a MyChart account, a copy of this consent can be sent to you electronically.  As this is a virtual visit, video technology does not allow for your provider to perform a traditional examination. This may limit your provider's ability to fully assess your condition. If your provider identifies any concerns that need to be evaluated in person or the need to arrange testing (such as labs, EKG, etc.), we will make arrangements to do so. Although advances in technology are sophisticated, we cannot ensure that it will always work on either your end or our end. If the connection with a video visit is poor, the visit may have to be switched to a telephone visit. With either a video or telephone visit, we are not always able to ensure that we have a secure connection.  By engaging in this virtual visit, you consent to the provision of healthcare and authorize for your insurance to be billed (if applicable) for the services provided during this visit. Depending on your insurance coverage, you may receive a charge related to this service.  I need to obtain your verbal consent now. Are you willing to proceed with your visit today? Kelly Matthews has provided verbal consent on 09/23/2023 for a virtual visit (video or telephone). [name], [place]  Date: 09/23/2023 2:00 PM  Virtual Visit via Video Note   I, Kelly Matthews [name], connected with  Kelly Matthews  ([id number], 04-Aug-1966) on 09/23/23 at  1:45 PM EST by a video-enabled telemedicine application and verified that I am speaking with the correct person using two identifiers.  Location: Patient: Virtual  Visit Location Patient: Home Provider: Virtual Visit Location Provider: Home Office   I discussed the limitations of evaluation and management by telemedicine and the availability of in person appointments. The patient expressed understanding and agreed to proceed.    History of Present Illness: Kelly Matthews is a 57 y.o. who identifies as a female who was assigned female at birth, and is being seen today for abdominal pain.   HPI: Abdominal Pain This is a new problem. The current episode started in the past 7 days. The onset quality is sudden. The problem occurs constantly. The problem has been unchanged. The pain is located in the generalized abdominal region. The pain is moderate. The quality of the pain is sharp ("punched in the stomach"). Associated symptoms include diarrhea and a fever. Nothing aggravates the pain. The pain is relieved by Nothing. She has tried nothing for the symptoms. The treatment provided no relief. There is no history of abdominal surgery.    Problems:  Patient Active Problem List   Diagnosis Date Noted   Upper airway cough syndrome vs asthmatic bronchitis 11/06/2022   Bright red rectal bleeding 08/25/2021   Class 3 severe obesity in adult [facility]) 11/04/2020   History of colonic polyps    Chronic bilateral low back pain with left-sided sciatica 03/20/2020   Muscle spasm of back 02/05/2020   Muscle cramp 02/05/2020   Left hip pain 02/05/2020   Psoriasis 03/13/2019   Stomach irritation    Abdominal pain, epigastric    Special screening for malignant neoplasms,  colon    Polyp of descending colon    Right leg pain 02/24/2018   Women's annual routine gynecological examination 05/14/2017   Malnutrition of moderate degree 12/07/2016   DKA (diabetic ketoacidosis) (HCC) 12/05/2016   Migraine headache 05/05/2016   Allergic rhinitis 07/23/2015   Excess, menstruation 07/19/2015   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   Blood glucose elevated 07/19/2015    Airway hyperreactivity 07/19/2015   Disorder of esophagus 07/19/2015   Hypercholesterolemia without hypertriglyceridemia 07/19/2015   Borderline diabetes 07/19/2015   Diabetes (HCC) 11/28/2014   Essential (primary) hypertension 11/28/2014   Aortic heart valve narrowing 11/28/2014   Combined fat and carbohydrate induced hyperlipemia 11/28/2014   Supraventricular tachycardia (HCC) 11/28/2014   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    Allergies:  Allergies  Allergen Reactions   Oxycodone Itching   Penicillins Itching   Medications:  Current Outpatient Medications:    albuterol (PROVENTIL) (2.5 MG/3ML) 0.083% nebulizer solution, Take 3 mLs (2.5 mg total) by nebulization every 6 (six) hours as needed for wheezing or shortness of breath., Disp: 75 mL, Rfl: 12   Albuterol-Budesonide (AIRSUPRA) 90-80 MCG/ACT AERO, INHALE 2 PUFFS BY MOUTH EVERY 4 HOURS AS NEEDED, Disp: 10.7 g, Rfl: 6   atorvastatin (LIPITOR) 10 MG tablet, Take 10 mg by mouth daily., Disp: , Rfl:    Continuous Blood Gluc Receiver (DEXCOM G6 RECEIVER) DEVI, USE FOR CONTINUOUS BLOOD GLUCOSE MONITORING, Disp: , Rfl:    Continuous Blood Gluc Sensor (DEXCOM G6 SENSOR) MISC, USE FOR CONTINUOUS BLOOD GLUCOSE MONITORING REPLACE SENSOR EVERY 10 DAYS, Disp: , Rfl:    Continuous Blood Gluc Transmit (DEXCOM G6 TRANSMITTER) MISC, USE FOR CONTINUOUS BLOOD GLUCOSE MONITORING REPLACE TRANSMITTER EVERY 90 DAYS, Disp: , Rfl:    cyclobenzaprine (FLEXERIL) 10 MG tablet, Take 1 tablet (10 mg total) by mouth 3 (three) times daily as needed., Disp: 15 tablet, Rfl: 0   Dapagliflozin-Metformin HCl ER 03-999 MG TB24, Take by mouth., Disp: , Rfl:    doxycycline (VIBRAMYCIN) 100 MG capsule, Take 1 capsule (100 mg total) by mouth 2 (two) times daily., Disp: 20 capsule, Rfl: 0   gabapentin (NEURONTIN) 300 MG capsule, TAKE 1 CAPSULE BY MOUTH THREE TIMES A DAY, Disp: 270 capsule, Rfl: 1   JARDIANCE 25 MG TABS tablet, Take 25 mg by mouth daily., Disp: , Rfl:     Lancets (ACCU-CHEK SOFT TOUCH) lancets, Use as instructed, Disp: 300 each, Rfl: 3   meloxicam (MOBIC) 15 MG tablet, TAKE 1 TABLET (15 MG TOTAL) BY MOUTH DAILY., Disp: 90 tablet, Rfl: 0   metFORMIN (GLUCOPHAGE) 1000 MG tablet, Take 1,000 mg by mouth 2 (two) times daily., Disp: , Rfl:    NOVOLOG FLEXPEN 100 UNIT/ML FlexPen, INJECT UP TO 60 UNITS DAILY IN DIVIDED DOSES AS DIRECTED, Disp: , Rfl:    ondansetron (ZOFRAN-ODT) 4 MG disintegrating tablet, Take 1 tablet (4 mg total) by mouth every 6 (six) hours as needed for nausea or vomiting., Disp: 20 tablet, Rfl: 0   pantoprazole (PROTONIX) 40 MG tablet, Take 1 tablet (40 mg total) by mouth daily. Take 30- 60 min before your first and last meals of the day, Disp: 60 tablet, Rfl: 2   promethazine-dextromethorphan (PROMETHAZINE-DM) 6.25-15 MG/5ML syrup, Take 5 mLs by mouth 4 (four) times daily as needed for cough., Disp: 118 mL, Rfl: 0   sertraline (ZOLOFT) 50 MG tablet, TAKE 1 TABLET BY MOUTH EVERY DAY, Disp: 90 tablet, Rfl: 0   TOUJEO SOLOSTAR 300 UNIT/ML Solostar Pen, INJECT 50 UNITS EVERY  DAY, Disp: , Rfl:    traZODone (DESYREL) 50 MG tablet, TAKE 1-2 TABLETS (50-100 MG TOTAL) BY MOUTH AT BEDTIME AS NEEDED (FOR INSOMNIA). FOR SLEEP, Disp: 180 tablet, Rfl: 1  Observations/Objective: Patient is well-developed, well-nourished in no acute distress.  Resting comfortably  at home.  Head is normocephalic, atraumatic.  No labored breathing.  Speech is clear and coherent with logical content.  Patient is alert and oriented at baseline.    Assessment and Plan: 1. Generalized abdominal pain  Recommend patient report to ER or UC for eval. Ongoing stabbing abdominal pain no known inciting events prior to symptom onset. Concerning for possibl food borne illness, vs intraabdominal pathology or viral related illness Patient in agreement with plan and is to report to [facility] for evaluation.   Follow Up Instructions: I discussed the assessment and treatment  plan with the patient. The patient was provided an opportunity to ask questions and all were answered. The patient agreed with the plan and demonstrated an understanding of the instructions.  A copy of instructions were sent to the patient via MyChart unless otherwise noted below.   The patient was advised to call back or seek an in-person evaluation if the symptoms worsen or if the condition fails to improve as anticipated.    [name], PA-C

## 2023-09-23 NOTE — Patient Instructions (Signed)
[name], thank you for joining [name], PA-C for today's virtual visit.  While this provider is not your primary care provider (PCP), if your PCP is located in our provider database this encounter information will be shared with them immediately following your visit.   A [facility] MyChart account gives you access to today's visit and all your visits, tests, and labs performed at [facility] " click here if you don't have a [facility] MyChart account or go to [email or web address]  Consent: (Patient) Kelly Matthews provided verbal consent for this virtual visit at the beginning of the encounter.  Current Medications:  Current Outpatient Medications:    albuterol (PROVENTIL) (2.5 MG/3ML) 0.083% nebulizer solution, Take 3 mLs (2.5 mg total) by nebulization every 6 (six) hours as needed for wheezing or shortness of breath., Disp: 75 mL, Rfl: 12   Albuterol-Budesonide (AIRSUPRA) 90-80 MCG/ACT AERO, INHALE 2 PUFFS BY MOUTH EVERY 4 HOURS AS NEEDED, Disp: 10.7 g, Rfl: 6   atorvastatin (LIPITOR) 10 MG tablet, Take 10 mg by mouth daily., Disp: , Rfl:    Continuous Blood Gluc Receiver (DEXCOM G6 RECEIVER) DEVI, USE FOR CONTINUOUS BLOOD GLUCOSE MONITORING, Disp: , Rfl:    Continuous Blood Gluc Sensor (DEXCOM G6 SENSOR) MISC, USE FOR CONTINUOUS BLOOD GLUCOSE MONITORING REPLACE SENSOR EVERY 10 DAYS, Disp: , Rfl:    Continuous Blood Gluc Transmit (DEXCOM G6 TRANSMITTER) MISC, USE FOR CONTINUOUS BLOOD GLUCOSE MONITORING REPLACE TRANSMITTER EVERY 90 DAYS, Disp: , Rfl:    cyclobenzaprine (FLEXERIL) 10 MG tablet, Take 1 tablet (10 mg total) by mouth 3 (three) times daily as needed., Disp: 15 tablet, Rfl: 0   Dapagliflozin-Metformin HCl ER 03-999 MG TB24, Take by mouth., Disp: , Rfl:    doxycycline (VIBRAMYCIN) 100 MG capsule, Take 1 capsule (100 mg total) by mouth 2 (two) times daily., Disp: 20 capsule, Rfl: 0   gabapentin (NEURONTIN) 300 MG capsule, TAKE 1 CAPSULE  BY MOUTH THREE TIMES A DAY, Disp: 270 capsule, Rfl: 1   JARDIANCE 25 MG TABS tablet, Take 25 mg by mouth daily., Disp: , Rfl:    Lancets (ACCU-CHEK SOFT TOUCH) lancets, Use as instructed, Disp: 300 each, Rfl: 3   meloxicam (MOBIC) 15 MG tablet, TAKE 1 TABLET (15 MG TOTAL) BY MOUTH DAILY., Disp: 90 tablet, Rfl: 0   metFORMIN (GLUCOPHAGE) 1000 MG tablet, Take 1,000 mg by mouth 2 (two) times daily., Disp: , Rfl:    NOVOLOG FLEXPEN 100 UNIT/ML FlexPen, INJECT UP TO 60 UNITS DAILY IN DIVIDED DOSES AS DIRECTED, Disp: , Rfl:    ondansetron (ZOFRAN-ODT) 4 MG disintegrating tablet, Take 1 tablet (4 mg total) by mouth every 6 (six) hours as needed for nausea or vomiting., Disp: 20 tablet, Rfl: 0   pantoprazole (PROTONIX) 40 MG tablet, Take 1 tablet (40 mg total) by mouth daily. Take 30- 60 min before your first and last meals of the day, Disp: 60 tablet, Rfl: 2   promethazine-dextromethorphan (PROMETHAZINE-DM) 6.25-15 MG/5ML syrup, Take 5 mLs by mouth 4 (four) times daily as needed for cough., Disp: 118 mL, Rfl: 0   sertraline (ZOLOFT) 50 MG tablet, TAKE 1 TABLET BY MOUTH EVERY DAY, Disp: 90 tablet, Rfl: 0   TOUJEO SOLOSTAR 300 UNIT/ML Solostar Pen, INJECT 50 UNITS EVERY DAY, Disp: , Rfl:    traZODone (DESYREL) 50 MG tablet, TAKE 1-2 TABLETS (50-100 MG TOTAL) BY MOUTH AT BEDTIME AS NEEDED (FOR INSOMNIA). FOR SLEEP, Disp: 180 tablet, Rfl: 1   Medications ordered in this encounter:  No orders of the defined types were placed in this encounter.    *If you need refills on other medications prior to your next appointment, please contact your pharmacy*  Follow-Up: Call back or seek an in-person evaluation if the symptoms worsen or if the condition fails to improve as anticipated.  [facility] [phone]  Other Instructions Report to ER/UC for evaluation of your abdominal pain.    If you have been instructed to have an in-person evaluation today at a local Urgent Care facility, please use  the link below. It will take you to a list of all of our available [facility], including address, phone number and hours of operation. Please do not delay care.  [facility]  If you or a family member do not have a primary care provider, use the link below to schedule a visit and establish care. When you choose a [employer] primary care physician or advanced practice provider, you gain a long-term partner in health. Find a Primary Care Provider  Learn more about [facility]'s in-office and virtual care options: [facility] - Get Care Now

## 2023-10-02 ENCOUNTER — Other Ambulatory Visit: Payer: Self-pay | Admitting: Physician Assistant

## 2023-10-02 DIAGNOSIS — G8929 Other chronic pain: Secondary | ICD-10-CM

## 2023-10-04 NOTE — Telephone Encounter (Signed)
Requested Prescriptions  Pending Prescriptions Disp Refills   meloxicam (MOBIC) 15 MG tablet [Pharmacy Med Name: MELOXICAM 15 MG TABLET] 90 tablet 0    Sig: TAKE 1 TABLET (15 MG TOTAL) BY MOUTH DAILY.     Analgesics:  COX2 Inhibitors Failed - 10/02/2023  9:03 AM      Failed - Manual Review: Labs are only required if the patient has taken medication for more than 8 weeks.      Failed - HGB in normal range and within 360 days    Hemoglobin  Date Value Ref Range Status  03/12/2023 11.6 (L) 12.0 - 15.0 g/dL Final  96/02/5408 81.1 11.1 - 15.9 g/dL Final         Passed - Cr in normal range and within 360 days    Creatinine, Ser  Date Value Ref Range Status  03/12/2023 0.57 0.44 - 1.00 mg/dL Final   Creatinine, POC  Date Value Ref Range Status  08/06/2017 NA mg/dL Final         Passed - HCT in normal range and within 360 days    HCT  Date Value Ref Range Status  03/12/2023 37.3 36.0 - 46.0 % Final   Hematocrit  Date Value Ref Range Status  12/17/2022 42.3 34.0 - 46.6 % Final         Passed - AST in normal range and within 360 days    AST  Date Value Ref Range Status  03/12/2023 21 15 - 41 U/L Final         Passed - ALT in normal range and within 360 days    ALT  Date Value Ref Range Status  03/12/2023 24 0 - 44 U/L Final         Passed - eGFR is 30 or above and within 360 days    GFR calc Af Amer  Date Value Ref Range Status  01/07/2021 122 >59 mL/min/1.73 Final    Comment:    **In accordance with recommendations from the NKF-ASN Task force,**   [facility] is in the process of updating its eGFR calculation to the   2021 CKD-EPI creatinine equation that estimates kidney function   without a race variable.    GFR, Estimated  Date Value Ref Range Status  03/12/2023 >60 >60 mL/min Final    Comment:    (NOTE) Calculated using the CKD-EPI Creatinine Equation (2021)    eGFR  Date Value Ref Range Status  12/17/2022 102 >59 mL/min/1.73 Final         Passed -  Patient is not pregnant      Passed - Valid encounter within last 12 months    Recent Outpatient Visits           9 months ago Persistent cough   [facility] [place], [place], PA-C   1 year ago Anxiety   [facility] [place], [place], PA-C   1 year ago Acute sinusitis, recurrence not specified, unspecified location   [facility] [place], Darl Householder, DO   1 year ago Leg swelling   [facility] [place], [place], PA-C   1 year ago Psoriasis   [facility] [place], [place], [place]

## 2023-10-05 DIAGNOSIS — Z03818 Encounter for observation for suspected exposure to other biological agents ruled out: Secondary | ICD-10-CM | POA: Diagnosis not present

## 2023-10-13 ENCOUNTER — Telehealth: Payer: Managed Care, Other (non HMO) | Admitting: Physician Assistant

## 2023-10-13 DIAGNOSIS — F419 Anxiety disorder, unspecified: Secondary | ICD-10-CM

## 2023-10-13 DIAGNOSIS — L4050 Arthropathic psoriasis, unspecified: Secondary | ICD-10-CM | POA: Diagnosis not present

## 2023-10-13 DIAGNOSIS — F410 Panic disorder [episodic paroxysmal anxiety] without agoraphobia: Secondary | ICD-10-CM

## 2023-10-13 NOTE — Progress Notes (Unsigned)
MyChart Video Visit  Virtual Visit via Video Note   This format is felt to be most appropriate for this patient at this time. Physical exam was limited by quality of the video and audio technology used for the visit.   Patient location: office Patient Location: Home  I discussed the limitations of evaluation and management by telemedicine and the availability of in person appointments. The patient expressed understanding and agreed to proceed.  Patient: Kelly Matthews   DOB: May 25, 1966   57 y.o. Female  MRN: [id number] Visit Date: 10/13/2023  Today's healthcare provider: Debera Lat, PA-C   Chief Complaint  Patient presents with   Acute Visit Panic attacks and worsening of anxiety symptoms   Subjective     Discussed the use of AI scribe software for clinical note transcription with the patient, who gave verbal consent to proceed.  History of Present Illness   The patient, with a history of mood disorder, reports worsening anxiety and panic attacks. They describe feeling nervous more than half of the days in the past two weeks, worrying about different things, having trouble relaxing, and feeling afraid as if something awful might happen. The patient's panic attacks are particularly severe when driving, to the point of screaming, hyperventilating, and needing to use breathing techniques to calm down. They deny any recent car accidents but express a fear of having one. The patient's anxiety does not seem to interfere with their daily life or relationships. They have previously tried Zoloft (sertraline) for their symptoms, but it was not effective. The patient is seeking a referral to a specific psychiatrist recommended by their brother. They also have a history of diabetes and were recently diagnosed with psoriatic arthritis by a rheumatologist.         Medications: Outpatient Medications Prior to Visit  Medication Sig   albuterol (PROVENTIL) (2.5 MG/3ML) 0.083% nebulizer  solution Take 3 mLs (2.5 mg total) by nebulization every 6 (six) hours as needed for wheezing or shortness of breath.   Albuterol-Budesonide (AIRSUPRA) 90-80 MCG/ACT AERO INHALE 2 PUFFS BY MOUTH EVERY 4 HOURS AS NEEDED   atorvastatin (LIPITOR) 10 MG tablet Take 10 mg by mouth daily.   Continuous Blood Gluc Receiver (DEXCOM G6 RECEIVER) DEVI USE FOR CONTINUOUS BLOOD GLUCOSE MONITORING   Continuous Blood Gluc Sensor (DEXCOM G6 SENSOR) MISC USE FOR CONTINUOUS BLOOD GLUCOSE MONITORING REPLACE SENSOR EVERY 10 DAYS   Continuous Blood Gluc Transmit (DEXCOM G6 TRANSMITTER) MISC USE FOR CONTINUOUS BLOOD GLUCOSE MONITORING REPLACE TRANSMITTER EVERY 90 DAYS   cyclobenzaprine (FLEXERIL) 10 MG tablet Take 1 tablet (10 mg total) by mouth 3 (three) times daily as needed.   Dapagliflozin-Metformin HCl ER 03-999 MG TB24 Take by mouth.   doxycycline (VIBRAMYCIN) 100 MG capsule Take 1 capsule (100 mg total) by mouth 2 (two) times daily.   gabapentin (NEURONTIN) 300 MG capsule TAKE 1 CAPSULE BY MOUTH THREE TIMES A DAY   JARDIANCE 25 MG TABS tablet Take 25 mg by mouth daily.   Lancets (ACCU-CHEK SOFT TOUCH) lancets Use as instructed   meloxicam (MOBIC) 15 MG tablet TAKE 1 TABLET (15 MG TOTAL) BY MOUTH DAILY.   metFORMIN (GLUCOPHAGE) 1000 MG tablet Take 1,000 mg by mouth 2 (two) times daily.   NOVOLOG FLEXPEN 100 UNIT/ML FlexPen INJECT UP TO 60 UNITS DAILY IN DIVIDED DOSES AS DIRECTED   ondansetron (ZOFRAN-ODT) 4 MG disintegrating tablet Take 1 tablet (4 mg total) by mouth every 6 (six) hours as needed for nausea or vomiting.   pantoprazole (  PROTONIX) 40 MG tablet Take 1 tablet (40 mg total) by mouth daily. Take 30- 60 min before your first and last meals of the day   promethazine-dextromethorphan (PROMETHAZINE-DM) 6.25-15 MG/5ML syrup Take 5 mLs by mouth 4 (four) times daily as needed for cough.   sertraline (ZOLOFT) 50 MG tablet TAKE 1 TABLET BY MOUTH EVERY DAY   TOUJEO SOLOSTAR 300 UNIT/ML Solostar Pen INJECT 50  UNITS EVERY DAY   traZODone (DESYREL) 50 MG tablet TAKE 1-2 TABLETS (50-100 MG TOTAL) BY MOUTH AT BEDTIME AS NEEDED (FOR INSOMNIA). FOR SLEEP   No facility-administered medications prior to visit.    Review of Systems  All other systems reviewed and are negative.  Except see HPI   {Insert previous labs (optional):23779} {See past labs  Heme  Chem  Endocrine  Serology  Results Review (optional):1}   Objective    LMP 04/22/2017 Comment: urine pregnancy test negative  {Insert last BP/Wt (optional):23777}{See vitals history (optional):1}    Physical Exam Constitutional:      General: She is not in acute distress.    Appearance: Normal appearance.  HENT:     Head: Normocephalic.  Pulmonary:     Effort: Pulmonary effort is normal. No respiratory distress.  Neurological:     Mental Status: She is alert and oriented to person, place, and time. Mental status is at baseline.        Assessment & Plan      Panic Disorder Frequent panic attacks, particularly while driving, with symptoms of hyperventilation and distress. Previous trials of sertraline and another unspecified medication were ineffective. Patient has a preference for a specific psychiatrist. -Refer to Dr. Jomarie Longs for further management. -Consider incorporating psychological therapy as it is often the treatment of choice for panic disorder.  Anxiety    10/13/2023    2:02 PM 12/17/2022   10:32 AM 07/21/2022    9:32 AM  PHQ9 SCORE ONLY  PHQ-9 Total Score 0 6 6      10/13/2023    2:04 PM  GAD 7 : Generalized Anxiety Score  Nervous, Anxious, on Edge 2  Control/stop worrying 0  Worry too much - different things 2  Trouble relaxing 2  Restless 0  Easily annoyed or irritable 1  Afraid - awful might happen 2  Total GAD 7 Score 9  Anxiety Difficulty Not difficult at all  Mild to moderate anxiety? Relaxation/mindfulness techniques were recommended. Might consider a different SSRIs? Delayed treatment  may result in poorer clinical outcomes compared with patients treated within 1 year of symptom onset. Psychodynamic psychotherapy might be considered : patient discovering and verbalizing unconscious conflicts  Pt declined and requested an appointment to psychiatry  Psoriatic Arthritis Newly diagnosed by a rheumatologist. Prescribed methotrexate, Celebrex, and folic acid. -Continue prescribed medications. -Follow-up in a couple of months for chronic condition management.  General Health Maintenance / Followup Plans -Schedule a follow-up visit in January 2025 for management of other chronic conditions. -Encourage patient to contact the office sooner if any issues arise.      Return in about 2 months (around 12/13/2023) for chronic disease f/u.     I discussed the assessment and treatment plan with the patient. The patient was provided an opportunity to ask questions and all were answered. The patient agreed with the plan and demonstrated an understanding of the instructions.   The patient was advised to call back or seek an in-person evaluation if the symptoms worsen or if the condition fails to improve as  anticipated.  I provided 10 minutes of non-face-to-face time during this encounter.   I, Debera Lat, PA-C have reviewed all documentation for this visit. The documentation on  @CurDate @  for the exam, diagnosis, procedures, and orders are all accurate and complete.  Debera Lat, [facility], MMS [facility] [phone] (phone) [phone] (fax)\T  [facility]

## 2023-10-14 ENCOUNTER — Encounter: Payer: Self-pay | Admitting: Physician Assistant

## 2023-10-17 ENCOUNTER — Telehealth: Payer: Managed Care, Other (non HMO) | Admitting: Family

## 2023-10-17 DIAGNOSIS — J208 Acute bronchitis due to other specified organisms: Secondary | ICD-10-CM

## 2023-10-17 DIAGNOSIS — B9689 Other specified bacterial agents as the cause of diseases classified elsewhere: Secondary | ICD-10-CM

## 2023-10-17 MED ORDER — BENZONATATE 100 MG PO CAPS: 100.0000 mg | ORAL_CAPSULE | Freq: Three times a day (TID) | ORAL | 0 refills | Status: DC | PRN

## 2023-10-17 MED ORDER — LEVOFLOXACIN 500 MG PO TABS: 500.0000 mg | ORAL_TABLET | Freq: Every day | ORAL | 0 refills | Status: AC

## 2023-10-17 NOTE — Progress Notes (Signed)
E-Visit for Cough   We are sorry that you are not feeling well.  Here is how we plan to help!  Based on your presentation I believe you most likely have A cough due to bacteria.  When patients have a fever and a productive cough with a change in color or increased sputum production, we are concerned about bacterial bronchitis.  If left untreated it can progress to pneumonia.  If your symptoms do not improve with your treatment plan it is important that you contact your provider.   I have prescribed Levofloxacin 500 mg daily for 7 days    In addition you may use A non-prescription cough medication called Robitussin DAC. Take 2 teaspoons every 8 hours or Delsym: take 2 teaspoons every 12 hours., A non-prescription cough medication called Mucinex DM: take 2 tablets every 12 hours., and A prescription cough medication called Tessalon Perles 200mg . You may take 1-2 capsules every 8 hours as needed for your cough.   **You need to call your provider Monday for a follow up and get a chest x-ray to rule out pneumonia.   From your responses in the eVisit questionnaire you describe inflammation in the upper respiratory tract which is causing a significant cough.  This is commonly called Bronchitis and has four common causes:   Allergies Viral Infections Acid Reflux Bacterial Infection Allergies, viruses and acid reflux are treated by controlling symptoms or eliminating the cause. An example might be a cough caused by taking certain blood pressure medications. You stop the cough by changing the medication. Another example might be a cough caused by acid reflux. Controlling the reflux helps control the cough.  USE OF BRONCHODILATOR ("RESCUE") INHALERS: There is a risk from using your bronchodilator too frequently.  The risk is that over-reliance on a medication which only relaxes the muscles surrounding the breathing tubes can reduce the effectiveness of medications prescribed to reduce swelling and  congestion of the tubes themselves.  Although you feel brief relief from the bronchodilator inhaler, your asthma may actually be worsening with the tubes becoming more swollen and filled with mucus.  This can delay other crucial treatments, such as oral steroid medications. If you need to use a bronchodilator inhaler daily, several times per day, you should discuss this with your provider.  There are probably better treatments that could be used to keep your asthma under control.     HOME CARE Only take medications as instructed by your medical team. Complete the entire course of an antibiotic. Drink plenty of fluids and get plenty of rest. Avoid close contacts especially the very young and the elderly Cover your mouth if you cough or cough into your sleeve. Always remember to wash your hands A steam or ultrasonic humidifier can help congestion.   GET HELP RIGHT AWAY IF: You develop worsening fever. You become short of breath You cough up blood. Your symptoms persist after you have completed your treatment plan MAKE SURE YOU  Understand these instructions. Will watch your condition. Will get help right away if you are not doing well or get worse.    Thank you for choosing an e-visit.  Your e-visit answers were reviewed by a board certified advanced clinical practitioner to complete your personal care plan. Depending upon the condition, your plan could have included both over the counter or prescription medications.  Please review your pharmacy choice. Make sure the pharmacy is open so you can pick up prescription now. If there is a problem, you may  contact your provider through [employer] and have the prescription routed to another pharmacy.  Your safety is important to [place]. If you have drug allergies check your prescription carefully.   For the next 24 hours you can use MyChart to ask questions about today's visit, request a non-urgent call back, or ask for a work or school  excuse. You will get an email in the next two days asking about your experience. I hope that your e-visit has been valuable and will speed your recovery.  Approximately 5 minutes was spent documenting and reviewing patient's chart.

## 2023-10-19 ENCOUNTER — Telehealth: Payer: Managed Care, Other (non HMO) | Admitting: Physician Assistant

## 2023-10-19 DIAGNOSIS — R42 Dizziness and giddiness: Secondary | ICD-10-CM

## 2023-10-20 NOTE — Progress Notes (Signed)
Because of recurring vertigo and need for further examination and testing, I feel your condition warrants further evaluation and I recommend that you be seen in a face to face visit.   NOTE: There will be NO CHARGE for this eVisit   If you are having a true medical emergency please call 911.      For an urgent face to face visit, [place] has eight urgent care centers for your convenience:   NEW!! [facility] Center at [facility] Get Driving Directions [phone] [address] [place], [address]    [facility] Health Urgent Care Center at [facility] Get Driving Directions [phone] [address] [place], [place] [address]   [facility] Health Urgent Care Center [facility]) Get Driving Directions [phone] [address] [place], [place] [address]  [facility] Health Urgent Care Center [facility]) Get Driving Directions [phone] [address] [place],  [place]  [address]  [facility] Health Urgent Care Center [facility] - at [employer]  [phone] [address] [place],  [place] [address]   [facility] Health Urgent Care at [facility] Get Driving Directions [phone] [address] [place], [place] [address]   [facility] Health Urgent Care at [facility] Get Driving Directions  [phone] [address] [place], [place] [address]   [facility] Health Urgent Care at [facility] Directions [phone] [address] [place], [place] [address]  Your MyChart E-visit questionnaire answers were reviewed by a board certified advanced clinical practitioner to complete your personal care plan based on your specific symptoms.  Thank you for using e-Visits.

## 2023-10-26 ENCOUNTER — Encounter: Payer: Self-pay | Admitting: Family Medicine

## 2023-10-26 ENCOUNTER — Ambulatory Visit (INDEPENDENT_AMBULATORY_CARE_PROVIDER_SITE_OTHER): Payer: Managed Care, Other (non HMO) | Admitting: Family Medicine

## 2023-10-26 VITALS — BP 150/75 | HR 89 | Temp 98.0°F | Ht 60.0 in | Wt 220.0 lb

## 2023-10-26 DIAGNOSIS — E785 Hyperlipidemia, unspecified: Secondary | ICD-10-CM

## 2023-10-26 DIAGNOSIS — R42 Dizziness and giddiness: Secondary | ICD-10-CM | POA: Diagnosis not present

## 2023-10-26 DIAGNOSIS — R109 Unspecified abdominal pain: Secondary | ICD-10-CM | POA: Diagnosis not present

## 2023-10-26 DIAGNOSIS — I152 Hypertension secondary to endocrine disorders: Secondary | ICD-10-CM

## 2023-10-26 DIAGNOSIS — F410 Panic disorder [episodic paroxysmal anxiety] without agoraphobia: Secondary | ICD-10-CM

## 2023-10-26 DIAGNOSIS — F411 Generalized anxiety disorder: Secondary | ICD-10-CM

## 2023-10-26 DIAGNOSIS — E1159 Type 2 diabetes mellitus with other circulatory complications: Secondary | ICD-10-CM

## 2023-10-26 DIAGNOSIS — E1169 Type 2 diabetes mellitus with other specified complication: Secondary | ICD-10-CM | POA: Diagnosis not present

## 2023-10-26 MED ORDER — MECLIZINE HCL 25 MG PO TABS: 25.0000 mg | ORAL_TABLET | Freq: Three times a day (TID) | ORAL | 0 refills | Status: DC | PRN

## 2023-10-26 MED ORDER — VALSARTAN 160 MG PO TABS
160.0000 mg | ORAL_TABLET | Freq: Every day | ORAL | 0 refills | Status: DC
Start: 2023-10-26 — End: 2023-10-26

## 2023-10-26 MED ORDER — QUETIAPINE FUMARATE ER 150 MG PO TB24: 150.0000 mg | ORAL_TABLET | Freq: Every day | ORAL | 2 refills | Status: DC

## 2023-10-26 MED ORDER — VALSARTAN 160 MG PO TABS: 160.0000 mg | ORAL_TABLET | Freq: Every day | ORAL | 2 refills | Status: AC

## 2023-10-26 NOTE — Assessment & Plan Note (Signed)
Acute on chronic, reports ongoing complaints Recommend abdominal enzymes

## 2023-10-26 NOTE — Assessment & Plan Note (Signed)
Chronic, unknown Repeat LP LDL remains <70 with T2DM recommend diet low in saturated fat and regular exercise - 30 min at least 5 times per week

## 2023-10-26 NOTE — Progress Notes (Signed)
Established patient visit   Patient: Kelly Matthews   DOB: 06-04-1966   57 y.o. Female  MRN: [id number] Visit Date: 10/26/2023  Today's healthcare provider: Jacky Kindle, FNP  Introduced to nurse practitioner role and practice setting.  All questions answered.  Discussed provider/patient relationship and expectations.  Chief Complaint  Patient presents with   Dizziness    Dizziness--Dx(vertigo)  stomach pain, fatigue, nausea--1 month   Subjective    HPI HPI     Dizziness    Additional comments: Dizziness--Dx(vertigo)  stomach pain, fatigue, nausea--1 month      Last edited by Shelly Bombard, CMA on 10/26/2023  9:24 AM.      Dexcom 100-130s average BP elevated today 150/70s SO in hospital  Did a tele doc visit and got meclizine  Medications: Outpatient Medications Prior to Visit  Medication Sig   [DISCONTINUED] fluticasone (FLONASE) 50 MCG/ACT nasal spray Place 1 spray into both nostrils 2 (two) times daily.   [DISCONTINUED] folic acid (FOLVITE) 1 MG tablet Take 1 mg by mouth daily.   [DISCONTINUED] meclizine (ANTIVERT) 25 MG tablet Take 25 mg by mouth 3 (three) times daily as needed for dizziness. Taking 4 times a day.   albuterol (PROVENTIL) (2.5 MG/3ML) 0.083% nebulizer solution Take 3 mLs (2.5 mg total) by nebulization every 6 (six) hours as needed for wheezing or shortness of breath.   Albuterol-Budesonide (AIRSUPRA) 90-80 MCG/ACT AERO INHALE 2 PUFFS BY MOUTH EVERY 4 HOURS AS NEEDED   atorvastatin (LIPITOR) 10 MG tablet Take 10 mg by mouth daily.   Continuous Blood Gluc Receiver (DEXCOM G6 RECEIVER) DEVI USE FOR CONTINUOUS BLOOD GLUCOSE MONITORING   Continuous Blood Gluc Sensor (DEXCOM G6 SENSOR) MISC USE FOR CONTINUOUS BLOOD GLUCOSE MONITORING REPLACE SENSOR EVERY 10 DAYS   Continuous Blood Gluc Transmit (DEXCOM G6 TRANSMITTER) MISC USE FOR CONTINUOUS BLOOD GLUCOSE MONITORING REPLACE TRANSMITTER EVERY 90 DAYS   JARDIANCE 25 MG TABS tablet Take 25 mg  by mouth daily.   Lancets (ACCU-CHEK SOFT TOUCH) lancets Use as instructed   NOVOLOG FLEXPEN 100 UNIT/ML FlexPen INJECT UP TO 60 UNITS DAILY IN DIVIDED DOSES AS DIRECTED   pantoprazole (PROTONIX) 40 MG tablet Take 1 tablet (40 mg total) by mouth daily. Take 30- 60 min before your first and last meals of the day   traZODone (DESYREL) 50 MG tablet TAKE 1-2 TABLETS (50-100 MG TOTAL) BY MOUTH AT BEDTIME AS NEEDED (FOR INSOMNIA). FOR SLEEP   [DISCONTINUED] benzonatate (TESSALON PERLES) 100 MG capsule Take 1 capsule (100 mg total) by mouth 3 (three) times daily as needed.   [DISCONTINUED] cyclobenzaprine (FLEXERIL) 10 MG tablet Take 1 tablet (10 mg total) by mouth 3 (three) times daily as needed.   [DISCONTINUED] Dapagliflozin-Metformin HCl ER 03-999 MG TB24 Take by mouth.   [DISCONTINUED] doxycycline (VIBRAMYCIN) 100 MG capsule Take 1 capsule (100 mg total) by mouth 2 (two) times daily.   [DISCONTINUED] gabapentin (NEURONTIN) 300 MG capsule TAKE 1 CAPSULE BY MOUTH THREE TIMES A DAY   [DISCONTINUED] meloxicam (MOBIC) 15 MG tablet TAKE 1 TABLET (15 MG TOTAL) BY MOUTH DAILY.   [DISCONTINUED] metFORMIN (GLUCOPHAGE) 1000 MG tablet Take 1,000 mg by mouth 2 (two) times daily.   [DISCONTINUED] ondansetron (ZOFRAN-ODT) 4 MG disintegrating tablet Take 1 tablet (4 mg total) by mouth every 6 (six) hours as needed for nausea or vomiting.   [DISCONTINUED] promethazine-dextromethorphan (PROMETHAZINE-DM) 6.25-15 MG/5ML syrup Take 5 mLs by mouth 4 (four) times daily as needed for cough.   [  DISCONTINUED] TOUJEO SOLOSTAR 300 UNIT/ML Solostar Pen INJECT 50 UNITS EVERY DAY   No facility-administered medications prior to visit.    Review of Systems Last CBC Lab Results  Component Value Date   WBC 8.7 03/12/2023   HGB 11.6 (L) 03/12/2023   HCT 37.3 03/12/2023   MCV 75.2 (L) 03/12/2023   MCH 23.4 (L) 03/12/2023   RDW 14.9 03/12/2023   PLT 160 03/12/2023   Last metabolic panel Lab Results  Component Value Date    GLUCOSE 188 (H) 03/12/2023   NA 137 03/12/2023   K 3.3 (L) 03/12/2023   CL 107 03/12/2023   CO2 21 (L) 03/12/2023   BUN 12 03/12/2023   CREATININE 0.57 03/12/2023   GFRNONAA >60 03/12/2023   CALCIUM 8.8 (L) 03/12/2023   PHOS 2.2 (L) 12/07/2016   PROT 7.2 03/12/2023   ALBUMIN 3.8 03/12/2023   LABGLOB 2.5 12/17/2022   AGRATIO 1.9 12/17/2022   BILITOT 0.9 03/12/2023   ALKPHOS 68 03/12/2023   AST 21 03/12/2023   ALT 24 03/12/2023   ANIONGAP 9 03/12/2023   Last lipids Lab Results  Component Value Date   CHOL 163 01/07/2021   HDL 56 01/07/2021   LDLCALC 96 01/07/2021   TRIG 53 01/07/2021   CHOLHDL 2.9 01/07/2021   Last hemoglobin A1c Lab Results  Component Value Date   HGBA1C 7.0 (H) 12/17/2022   Last thyroid functions Lab Results  Component Value Date   TSH 1.700 01/07/2021   Last vitamin D No results found for: "25OHVITD2", "25OHVITD3", "VD25OH" Last vitamin B12 and Folate Lab Results  Component Value Date   VITAMINB12 1,047 02/05/2020     Objective    BP (!) 150/75 (BP Location: Left Arm, Patient Position: Sitting, Cuff Size: Normal)   Pulse 89   Temp 98 F (36.7 C)   Ht 5' (1.524 m)   Wt 220 lb (99.8 kg)   LMP 04/22/2017 Comment: urine pregnancy test negative  SpO2 97%   BMI 42.97 kg/m   BP Readings from Last 3 Encounters:  10/26/23 (!) 150/75  03/12/23 (!) 149/87  01/26/23 (!) 142/62   Wt Readings from Last 3 Encounters:  10/26/23 220 lb (99.8 kg)  01/25/23 215 lb (97.5 kg)  12/25/22 215 lb 12.8 oz (97.9 kg)   SpO2 Readings from Last 3 Encounters:  10/26/23 97%  03/12/23 98%  01/26/23 97%   Physical Exam Vitals and nursing note reviewed.  Constitutional:      General: She is not in acute distress.    Appearance: Normal appearance. She is obese. She is not ill-appearing, toxic-appearing or diaphoretic.  HENT:     Head: Normocephalic and atraumatic.  Cardiovascular:     Rate and Rhythm: Normal rate and regular rhythm.     Pulses: Normal  pulses.     Heart sounds: Normal heart sounds. No murmur heard.    No friction rub. No gallop.  Pulmonary:     Effort: Pulmonary effort is normal. No respiratory distress.     Breath sounds: Normal breath sounds. No stridor. No wheezing, rhonchi or rales.  Chest:     Chest wall: No tenderness.  Abdominal:     General: Bowel sounds are normal. There is no distension.     Palpations: Abdomen is soft.     Tenderness: There is no abdominal tenderness. There is no right CVA tenderness, left CVA tenderness or guarding.  Musculoskeletal:        General: No swelling, tenderness, deformity or signs of injury.  Normal range of motion.     Right lower leg: No edema.     Left lower leg: No edema.  Skin:    General: Skin is warm and dry.     Capillary Refill: Capillary refill takes less than 2 seconds.     Coloration: Skin is not jaundiced or pale.     Findings: No bruising, erythema, lesion or rash.  Neurological:     General: No focal deficit present.     Mental Status: She is alert and oriented to person, place, and time. Mental status is at baseline.     Cranial Nerves: No cranial nerve deficit.     Sensory: No sensory deficit.     Motor: No weakness.     Coordination: Coordination normal.  Psychiatric:        Mood and Affect: Mood normal.        Behavior: Behavior normal.        Thought Content: Thought content normal.        Judgment: Judgment normal.     No results found for any visits on 10/26/23.  Assessment & Plan     Problem List Items Addressed This Visit       Cardiovascular and Mediastinum   Hypertension associated with diabetes (HCC)    Chronic, uncontrolled Recommend start of ARB or ACE to assist 1 month f/u recommended       Relevant Medications   valsartan (DIOVAN) 160 MG tablet   Other Relevant Orders   Comprehensive Metabolic Panel (CMET)   CBC with Differential/Platelet   TSH   Ambulatory referral to Cardiology     Endocrine   Hyperlipidemia associated  with type 2 diabetes mellitus (HCC)    Chronic, unknown Repeat LP LDL remains <70 with T2DM recommend diet low in saturated fat and regular exercise - 30 min at least 5 times per week       Relevant Medications   valsartan (DIOVAN) 160 MG tablet   Other Relevant Orders   Hemoglobin A1c   Urine Microalbumin w/creat. ratio   Lipid panel   Ambulatory referral to Cardiology     Other   Acute abdominal pain - Primary    Acute on chronic, reports ongoing complaints Recommend abdominal enzymes       Relevant Orders   Lipase   Amylase   Generalized anxiety disorder with panic attacks    Acute on chronic, worsening Trial of seroquel to assist with ongoing referral to psych      Relevant Orders   Ambulatory referral to Neurology   Vertigo    Acute on chronic, ongoing since 2021 Trial of meclizine to assist Referrals back to cardiology and neurology to assist  Continue to monitor Previously low K with out use of hydrochlorothiazide or diuretics       Relevant Orders   Comprehensive Metabolic Panel (CMET)   CBC with Differential/Platelet   TSH   Vitamin D (25 hydroxy)   B12 and Folate Panel   Ambulatory referral to Cardiology   Ambulatory referral to Neurology   Return in about 4 weeks (around 11/23/2023) for HTN management, anxiety and depression.     Leilani Merl, FNP, have reviewed all documentation for this visit. The documentation on 10/26/23 for the exam, diagnosis, procedures, and orders are all accurate and complete.  Jacky Kindle, FNP  [facility] [phone] (phone) [phone] (fax)  [facility]

## 2023-10-26 NOTE — Assessment & Plan Note (Signed)
Chronic, uncontrolled Recommend start of ARB or ACE to assist 1 month f/u recommended

## 2023-10-26 NOTE — Assessment & Plan Note (Signed)
Acute on chronic, ongoing since 2021 Trial of meclizine to assist Referrals back to cardiology and neurology to assist  Continue to monitor Previously low K with out use of hydrochlorothiazide or diuretics

## 2023-10-26 NOTE — Assessment & Plan Note (Signed)
Acute on chronic, worsening Trial of seroquel to assist with ongoing referral to psych

## 2023-10-27 ENCOUNTER — Telehealth: Payer: Self-pay | Admitting: *Deleted

## 2023-10-27 ENCOUNTER — Other Ambulatory Visit: Payer: Self-pay | Admitting: Family Medicine

## 2023-10-27 MED ORDER — ROSUVASTATIN CALCIUM 20 MG PO TABS: 20.0000 mg | ORAL_TABLET | Freq: Every day | ORAL | 3 refills | Status: AC

## 2023-10-27 NOTE — Telephone Encounter (Signed)
Notified pt [employer] FMLA form is--complete and signed ready to be pick up at the front office. Pt stated will come by to pick the form on Monday.

## 2023-10-27 NOTE — Progress Notes (Signed)
Interval increase of liver enzymes- alk phos and ALT. Continue to monitor use of NSAIDs and Alcohol if applicable. I continue to recommend diet low in saturated fat and regular exercise - 30 min at least 5 times per week  A1c is no longer at goal; now at 8.1%. Recommend consult back with your endocrine team.  LDL is also increased; goal remains <70. Recommend increased strength of statin. We have lipitor 10 mg on file; recommend change to Crestor 20 daily. #90 r 1.  The 10-year ASCVD risk score (Arnett DK, et al., 2019) is: 6.5%  Vit D is low; recommend start of 5000 IU Vit D 3 daily to assist.  Other labs are normal and stable; urine pending.  Secondary referrals to neuro and cards for chronic dizziness.

## 2023-10-28 LAB — CBC WITH DIFFERENTIAL/PLATELET
Basophils Absolute: 0.1 10*3/uL (ref 0.0–0.2)
Basos: 1 %
EOS (ABSOLUTE): 0.1 10*3/uL (ref 0.0–0.4)
Eos: 1 %
Hematocrit: 41.8 % (ref 34.0–46.6)
Hemoglobin: 12.4 g/dL (ref 11.1–15.9)
Immature Grans (Abs): 0 10*3/uL (ref 0.0–0.1)
Immature Granulocytes: 1 %
Lymphocytes Absolute: 1.7 10*3/uL (ref 0.7–3.1)
Lymphs: 20 %
MCH: 22 pg — ABNORMAL LOW (ref 26.6–33.0)
MCHC: 29.7 g/dL — ABNORMAL LOW (ref 31.5–35.7)
MCV: 74 fL — ABNORMAL LOW (ref 79–97)
Monocytes Absolute: 0.4 10*3/uL (ref 0.1–0.9)
Monocytes: 4 %
Neutrophils Absolute: 6.5 10*3/uL (ref 1.4–7.0)
Neutrophils: 73 %
Platelets: 243 10*3/uL (ref 150–450)
RBC: 5.63 x10E6/uL — ABNORMAL HIGH (ref 3.77–5.28)
RDW: 17.5 % — ABNORMAL HIGH (ref 11.7–15.4)
WBC: 8.8 10*3/uL (ref 3.4–10.8)

## 2023-10-28 LAB — LIPID PANEL
Chol/HDL Ratio: 3.1 {ratio} (ref 0.0–4.4)
Cholesterol, Total: 180 mg/dL (ref 100–199)
HDL: 59 mg/dL (ref >39)
LDL Chol Calc (NIH): 104 mg/dL — ABNORMAL HIGH (ref 0–99)
Triglycerides: 91 mg/dL (ref 0–149)
VLDL Cholesterol Cal: 17 mg/dL (ref 5–40)

## 2023-10-28 LAB — COMPREHENSIVE METABOLIC PANEL
ALT: 55 [IU]/L — ABNORMAL HIGH (ref 0–32)
AST: 30 [IU]/L (ref 0–40)
Albumin: 5 g/dL — ABNORMAL HIGH (ref 3.8–4.9)
Alkaline Phosphatase: 143 [IU]/L — ABNORMAL HIGH (ref 44–121)
BUN/Creatinine Ratio: 19 (ref 9–23)
BUN: 11 mg/dL (ref 6–24)
Bilirubin Total: 0.4 mg/dL (ref 0.0–1.2)
CO2: 22 mmol/L (ref 20–29)
Calcium: 10.3 mg/dL — ABNORMAL HIGH (ref 8.7–10.2)
Chloride: 100 mmol/L (ref 96–106)
Creatinine, Ser: 0.59 mg/dL (ref 0.57–1.00)
Globulin, Total: 2.6 g/dL (ref 1.5–4.5)
Glucose: 232 mg/dL — ABNORMAL HIGH (ref 70–99)
Potassium: 4.2 mmol/L (ref 3.5–5.2)
Sodium: 138 mmol/L (ref 134–144)
Total Protein: 7.6 g/dL (ref 6.0–8.5)
eGFR: 106 mL/min/{1.73_m2} (ref >59)

## 2023-10-28 LAB — AMYLASE: Amylase: 30 U/L — ABNORMAL LOW (ref 31–110)

## 2023-10-28 LAB — VITAMIN D 25 HYDROXY (VIT D DEFICIENCY, FRACTURES): Vit D, 25-Hydroxy: 27 ng/mL — ABNORMAL LOW (ref 30.0–100.0)

## 2023-10-28 LAB — LIPASE: Lipase: 35 U/L (ref 14–72)

## 2023-10-28 LAB — MICROALBUMIN / CREATININE URINE RATIO
Creatinine, Urine: 28.6 mg/dL
Microalb/Creat Ratio: 250 mg/g{creat} — ABNORMAL HIGH (ref 0–29)
Microalbumin, Urine: 71.6 ug/mL

## 2023-10-28 LAB — HEMOGLOBIN A1C
Est. average glucose Bld gHb Est-mCnc: 186 mg/dL
Hgb A1c MFr Bld: 8.1 % — ABNORMAL HIGH (ref 4.8–5.6)

## 2023-10-28 LAB — TSH: TSH: 1.95 u[IU]/mL (ref 0.450–4.500)

## 2023-10-28 LAB — B12 AND FOLATE PANEL
Folate: >20 ng/mL (ref >3.0)
Vitamin B-12: 1055 pg/mL (ref 232–1245)

## 2023-11-02 ENCOUNTER — Telehealth: Payer: Managed Care, Other (non HMO) | Admitting: Physician Assistant

## 2023-11-02 DIAGNOSIS — Z20822 Contact with and (suspected) exposure to covid-19: Secondary | ICD-10-CM

## 2023-11-02 NOTE — Progress Notes (Signed)
Awaiting patient to COVID test and message with results. Will close out encounter for now since end of shift but will reopen when patient responds.

## 2023-11-02 NOTE — Progress Notes (Signed)
Message sent to patient requesting further input regarding current symptoms. Awaiting patient response.  

## 2023-11-16 ENCOUNTER — Telehealth: Payer: Managed Care, Other (non HMO) | Admitting: Physician Assistant

## 2023-11-16 DIAGNOSIS — B9689 Other specified bacterial agents as the cause of diseases classified elsewhere: Secondary | ICD-10-CM

## 2023-11-16 DIAGNOSIS — J208 Acute bronchitis due to other specified organisms: Secondary | ICD-10-CM

## 2023-11-16 MED ORDER — BENZONATATE 100 MG PO CAPS: 100.0000 mg | ORAL_CAPSULE | Freq: Three times a day (TID) | ORAL | 0 refills | Status: DC | PRN

## 2023-11-16 MED ORDER — DOXYCYCLINE HYCLATE 100 MG PO TABS: 100.0000 mg | ORAL_TABLET | Freq: Two times a day (BID) | ORAL | 0 refills | Status: DC

## 2023-11-16 NOTE — Progress Notes (Signed)

## 2023-11-21 ENCOUNTER — Other Ambulatory Visit: Payer: Self-pay | Admitting: Family Medicine

## 2023-11-22 ENCOUNTER — Ambulatory Visit: Payer: Self-pay

## 2023-11-22 NOTE — ED Provider Notes (Signed)
 [facility] Emergency Department Provider Note   ED Clinical Impression     Diagnosis ICD-10-CM Associated Orders  1. Benign paroxysmal positional vertigo, unspecified laterality  H81.10 Ambulatory referral to ENT          Impression, Medical Decision Making, Progress Notes and Critical Care    Kelly Matthews is a 58 y.o. female with a PMH of HTN, HLD, SVT s/p ablation (2021), T2DM, NAFLD, and psoriatic arthritis (recently started on Methotrexate) presenting with dizziness.  The patient is in no acute distress. VS are WNL. PERRL. EOMI. CN II-VII intact. Strength 5/5 in all extremities. Normal finger-to-nose test.    Differential includes BPPV, Meniere's disease, vestibular neuritis. Plan for Dix-Hallpike, screening labs.   Symptoms provoked by Dix-Hallpike test, consistent with BPPV.  ED Course as of 11/22/23 2052  Mon Nov 22, 2023  2047 WBC: 10.7  2047 HGB: 11.8  2047 Creatinine(!): 0.52 Labs reassuring. Plan to refer to ENT for vestibular therapy. Discussed Epley maneuver. Given provoked dizziness with head movements only, normal neurologic exam, low suspicion for other causes of vertigo, presentation classic for BPPV.  The patient will be discharged. The plan, findings of the ED workup, and likely diagnosis were explained to the patient. Return precautions specific to the symptoms and diagnosis were explained, along with follow up instructions. The patient is comfortable with the plan and voiced understanding. Discharged home in good condition.         Additional MDM Elements       I have reviewed recent and relavant previous record, including: Outpatient notes - 11/16/2023 [facility] Office Visit note for patient's past medical history.       Portions of this record have been created using Scientist, clinical (histocompatibility and immunogenetics). Dictation errors have been sought, but may not have been identified and corrected.  See chart and nursing  documentation for additional ED course details.       History     Reason for Visit Dizziness   HPI  Kelly Matthews is a 58 y.o. female PMH of HTN, HLD, SVT s/p ablation (2021), T2DM, NAFLD, and psoriatic arthritis (recently started on Methotrexate) presenting with vertigo. The patient reports acute worsening of 2 months of vertigo that nearly causes her to fall with associated nausea. The dizziness always occurs with movement or when changing positions; she is fine at rest. It is worse when she moves her head to the right compared to the left. She has blurry vision at baseline secondary to her diabetes but feel as if it has been worse recently. She also notes bilateral weakness in the arms and hands at baseline. No difficulty speaking or pronouncing words.   The patient's primary care physician prescribed her 25 mg Meclizine  which she has been taking 3 times a day without relief of her symptoms. She has an appointment with neurology in February. She called her PCP today regarding her worsening dizziness who said the neurology appointment could not be moved up. She referred her to the ED. The patient expressed concern for Meniere's disease.   Outside Historian(s): N/A    Past Medical History:  Diagnosis Date  . Diabetes mellitus (CMS-HCC)   . Hypertension     Past Surgical History:  Procedure Laterality Date  . BACK SURGERY    . CARDIAC ELECTROPHYSIOLOGY STUDY AND ABLATION  11/16/2020  . NECK SURGERY Midline   . PR COLONOSCOPY FLX DX W/COLLJ SPEC WHEN PFRMD N/A 03/26/2022   Procedure: COLONOSCOPY, FLEXIBLE, PROXIMAL TO SPLENIC FLEXURE; DIAGNOSTIC,  W/WO COLLECTION SPECIMEN BY BRUSH OR WASH;  Surgeon: Arlean Obadiah Spitz, MD;  Location: [facility];  Service: Gastroenterology  . SKIN BIOPSY      No current facility-administered medications for this encounter.  Current Outpatient Medications:  .  atorvastatin (LIPITOR) 10 MG tablet, Take 1 tablet (10 mg total)  by mouth daily., Disp: , Rfl:  .  benzonatate  (TESSALON ) 100 MG capsule, Take 100 mg by mouth. (Patient not taking: Reported on 11/14/2021), Disp: , Rfl:  .  BINAXNOW COVID-19 AG SELF TEST Kit, FOLLOW INSTRUCTIONS INCLUDED WITH THE PACKAGE. (Patient not taking: Reported on 03/25/2022), Disp: , Rfl:  .  blood sugar diagnostic (GLUCOSE BLOOD) Strp, Use as instructed, Disp: , Rfl:  .  cyclobenzaprine  (FLEXERIL ) 5 MG tablet, TAKE 1 TABLET BY MOUTH THREE TIMES A DAY AS NEEDED FOR MUSCLE SPASMS (Patient not taking: Reported on 07/16/2022), Disp: , Rfl:  .  dapagliflozin-metformin  (XIGDUO XR) 5-1,000 mg TBph, Take 2 tablets by mouth., Disp: , Rfl:  .  DEXCOM G6 RECEIVER Misc, USE FOR CONTINUOUS BLOOD GLUCOSE MONITORING, Disp: , Rfl:  .  DEXCOM G6 SENSOR Devi, USE FOR CONTINUOUS BLOOD GLUCOSE MONITORING REPLACE SENSOR EVERY 10 DAYS, Disp: , Rfl:  .  DEXCOM G6 TRANSMITTER Devi, USE FOR CONTINUOUS BLOOD GLUCOSE MONITORING REPLACE TRANSMITTER EVERY 90 DAYS, Disp: , Rfl:  .  diltiazem  (DILACOR XR ) 120 MG 24 hr capsule, TAKE 1 CAPSULE (120 MG TOTAL) BY MOUTH ONCE DAILY. (Patient not taking: Reported on 03/25/2022), Disp: , Rfl: 3 .  hydroCHLOROthiazide (MICROZIDE) 12.5 mg capsule, 1 capsule. (Patient not taking: Reported on 03/25/2022), Disp: , Rfl:  .  ipratropium (ATROVENT ) 0.02 % nebulizer solution, Inhale 2.5 mL (500 mcg total) by nebulization two (2) times a day., Disp: 75 mL, Rfl: 12 .  lancets Misc, Use as instructed, Disp: , Rfl:  .  meloxicam  (MOBIC ) 15 MG tablet, Take 1 tablet (15 mg total) by mouth daily. TAKE 1 TABLET (15 MG TOTAL) BY MOUTH DAILY., Disp: , Rfl:  .  mometasone  (ELOCON ) 0.1 % ointment, , Disp: , Rfl:  .  montelukast  (SINGULAIR ) 10 mg tablet, Take 10 mg by mouth daily. (Patient not taking: Reported on 03/25/2022), Disp: , Rfl: 5 .  ondansetron  (ZOFRAN ) 4 MG tablet, Take 4 mg by mouth. (Patient not taking: Reported on 03/25/2022), Disp: , Rfl:  .  PARoxetine  (PAXIL ) 10 MG tablet, 1 tablet  nightly. (Patient not taking: Reported on 07/16/2022), Disp: , Rfl:  .  polyethylene glycol (CLEARLAX) 17 gram/dose powder, Take as directed for split prep. (Patient not taking: Reported on 07/16/2022), Disp: 238 g, Rfl: 0 .  sertraline  (ZOLOFT ) 25 MG tablet, , Disp: , Rfl:  .  sertraline  (ZOLOFT ) 25 MG tablet, , Disp: , Rfl:  .  telmisartan (MICARDIS) 40 MG tablet, Take 40 mg by mouth daily. (Patient not taking: Reported on 03/25/2022), Disp: , Rfl:  .  traZODone  (DESYREL ) 50 MG tablet, Take 2 tablets (100 mg total) by mouth., Disp: , Rfl:   Allergies Oxycodone and Penicillins  Family History  Problem Relation Age of Onset  . Heart disease Mother   . Diabetes Mother   . Acne Mother   . Heart disease Father   . Diabetes Father   . Melanoma Neg Hx   . Basal cell carcinoma Neg Hx   . Squamous cell carcinoma Neg Hx     Social History   Tobacco Use  . Smoking status: Former    Types: Cigarettes  Passive exposure: Past  . Smokeless tobacco: Never  Vaping Use  . Vaping status: Never Used  Substance Use Topics  . Alcohol use: Yes    Alcohol/week: 0.0 standard drinks of alcohol  . Drug use: Never      Physical Exam   ED Triage Vitals [11/22/23 1212]  Enc Vitals Group     BP 143/78     Heart Rate 93     SpO2 Pulse      Resp 18     Temp 36.7 C (98.1 F)     Temp Source Oral     SpO2 98 %     Weight 98.4 kg (217 lb)     Height 1.524 m (5')   Constitutional: Alert and oriented. Well appearing and in no distress. Eyes: Conjunctivae are normal. PERRLA, EOMI. ENT      Head: Normocephalic and atraumatic.      Nose: No congestion.      Mouth/Throat: Mucous membranes are moist.      Neck: No stridor. Cardiovascular: Normal rate, regular rhythm.  Respiratory: Normal respiratory effort. Breath sounds are normal. Gastrointestinal: Abdomen soft and nontender throughout. There is no CVA tenderness. Musculoskeletal: Normal range of motion in all extremities.      Right lower  leg: No tenderness or edema.      Left lower leg: No tenderness or edema. Neurologic: Normal speech and language. No gross focal neurologic deficits are appreciated. PERRL. EOMI. CN II-VII intact. Strength 5/5 in all extremities. Normal finger-to-nose test.  Skin: Skin is warm, dry and intact. No rash noted. Psychiatric: Mood and affect are normal. Speech and behavior are normal.    Radiology   No orders to display    Documentation assistance was provided by Coley Emms, Scribe, on November 22, 2023 at 7:08 PM for Darice Buys, MD  November 22, 2023 6:54 PM. Documentation assistance provided by the scribe. I was present during the time the encounter was recorded. The information recorded by the scribe was done at my direction and has been reviewed and validated by me.     Buys Darice Aquas, MD 11/22/23 [phone]

## 2023-11-22 NOTE — Telephone Encounter (Signed)
  Chief Complaint: Vertigo - Dizziness - nearly fell several times over the weekend Symptoms: above Frequency: Seen in office 10/26/2023 for same Pertinent Negatives: Patient denies  Disposition: [x] ED /[] Urgent Care (no appt availability in office) / [] Appointment(In office/virtual)/ []  [facility]/ [] Home Care/ [] Refused Recommended Disposition /[] [facility]/ []  Follow-up with PCP Additional Notes: Pt was seen in office for vertigo and referred to Neurology. Pt has appt in Feb to see them. Pt was seen at Cardiology who also states that pt needs to go to Neurology. Pt states that she nearly fell several times over the weekend. Pt is hoping that PCP can get her in sooner to neuro. Pt will go to ED for care  Reason for Disposition  [1] Dizziness (vertigo) present now AND [2] one or more STROKE RISK FACTORS (i.e., hypertension, diabetes, prior stroke/TIA, heart attack)  (Exception: Prior doctor or NP/PA evaluation for this AND no different/worse than usual.)  Answer Assessment - Initial Assessment Questions 1. DESCRIPTION: Describe your dizziness.     Very dizzy - almost fell several times over the weekend 2. VERTIGO: Do you feel like either you or the room is spinning or tilting?      yes 3. LIGHTHEADED: Do you feel lightheaded? (e.g., somewhat faint, woozy, weak upon standing)     yes 4. SEVERITY: How bad is it?  Can you walk?   - MILD: Feels slightly dizzy and unsteady, but is walking normally.   - MODERATE: Feels unsteady when walking, but not falling; interferes with normal activities (e.g., school, work).   - SEVERE: Unable to walk without falling, or requires assistance to walk without falling.     Moderate 5. ONSET:  When did the dizziness begin?     A few weeks  7. CAUSE: What do you think is causing the dizziness?     Unsure  Protocols used: Dizziness - Vertigo-A-AH

## 2023-11-23 NOTE — Telephone Encounter (Signed)
 Requested medication (s) are due for refill today - yes  Requested medication (s) are on the active medication list -yes  Future visit scheduled no  Last refill: 10/26/23 #90  Notes to clinic: non delegated Rx  Requested Prescriptions  Pending Prescriptions Disp Refills   meclizine  (ANTIVERT ) 25 MG tablet [Pharmacy Med Name: MECLIZINE  25MG  RX TABLETS] 90 tablet 0    Sig: TAKE 1 TABLET(25 MG) BY MOUTH THREE TIMES DAILY AS NEEDED FOR DIZZINESS     Not Delegated - Gastroenterology: Antiemetics Failed - 11/23/2023  9:20 AM      Failed - This refill cannot be delegated      Passed - Valid encounter within last 6 months    Recent Outpatient Visits           4 weeks ago Acute abdominal pain   [place] [facility] Emilio Marseille T, FNP   1 month ago Panic attacks   [place] [facility] [place], [place], PA-C   11 months ago Persistent cough   [place] [facility] [place], [place], [place]   1 year ago Anxiety   [place] [facility] [place], [place], PA-C   1 year ago Acute sinusitis, recurrence not specified, unspecified location   [facility] [place], [place] HERO, DO                 Requested Prescriptions  Pending Prescriptions Disp Refills   meclizine  (ANTIVERT ) 25 MG tablet [Pharmacy Med Name: MECLIZINE  25MG  RX TABLETS] 90 tablet 0    Sig: TAKE 1 TABLET(25 MG) BY MOUTH THREE TIMES DAILY AS NEEDED FOR DIZZINESS     Not Delegated - Gastroenterology: Antiemetics Failed - 11/23/2023  9:20 AM      Failed - This refill cannot be delegated      Passed - Valid encounter within last 6 months    Recent Outpatient Visits           4 weeks ago Acute abdominal pain   [place] [facility] Emilio Marseille DASEN, FNP   1 month ago Panic attacks   [place] [facility] [place], [place], [place]   11 months ago Persistent cough   [place] [facility]  [place], [place], PA-C   1 year ago Anxiety   [place] [facility] [place], [place], PA-C   1 year ago Acute sinusitis, recurrence not specified, unspecified location   [facility] [place] Donald HERO, DO

## 2023-11-24 ENCOUNTER — Ambulatory Visit: Payer: Managed Care, Other (non HMO) | Admitting: Family Medicine
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
## 2023-12-22 ENCOUNTER — Telehealth: Payer: Managed Care, Other (non HMO) | Admitting: Physician Assistant

## 2023-12-22 DIAGNOSIS — J069 Acute upper respiratory infection, unspecified: Secondary | ICD-10-CM

## 2023-12-22 MED ORDER — FLUTICASONE PROPIONATE 50 MCG/ACT NA SUSP: 2.0000 | Freq: Every day | NASAL | 0 refills | Status: DC

## 2023-12-22 MED ORDER — BENZONATATE 100 MG PO CAPS: 100.0000 mg | ORAL_CAPSULE | Freq: Three times a day (TID) | ORAL | 0 refills | Status: DC | PRN

## 2023-12-22 NOTE — Progress Notes (Signed)

## 2023-12-23 ENCOUNTER — Ambulatory Visit (INDEPENDENT_AMBULATORY_CARE_PROVIDER_SITE_OTHER): Payer: Self-pay | Admitting: Psychiatry

## 2023-12-23 ENCOUNTER — Encounter: Payer: Self-pay | Admitting: Psychiatry

## 2023-12-23 VITALS — BP 122/84 | HR 100 | Temp 98.1°F | Ht 61.0 in | Wt 216.2 lb

## 2023-12-23 DIAGNOSIS — G4700 Insomnia, unspecified: Secondary | ICD-10-CM | POA: Diagnosis not present

## 2023-12-23 DIAGNOSIS — F4310 Post-traumatic stress disorder, unspecified: Secondary | ICD-10-CM

## 2023-12-23 DIAGNOSIS — F411 Generalized anxiety disorder: Secondary | ICD-10-CM | POA: Diagnosis not present

## 2023-12-23 MED ORDER — FLUOXETINE HCL 20 MG PO CAPS: 20.0000 mg | ORAL_CAPSULE | Freq: Every day | ORAL | 1 refills | Status: AC

## 2023-12-23 NOTE — Addendum Note (Signed)
 Addended by: Markella Dao on: 12/23/2023 05:25 PM   Modules accepted: Orders

## 2023-12-23 NOTE — Patient Instructions (Signed)
 Start fluoxetine  20 mg daily  After a week of taking fluoxetine , decrease quetiapine  75 mg daily for one week, then discontinue Referral for therapy  Next appointment: 4/3 at 4:30

## 2023-12-23 NOTE — Telephone Encounter (Signed)
 Discussed with the patient, and the plan has been addressed: discontinue Quetiapine  and refer for a sleep evaluation.

## 2023-12-29 ENCOUNTER — Other Ambulatory Visit: Payer: Self-pay | Admitting: Physician Assistant

## 2023-12-29 DIAGNOSIS — G4700 Insomnia, unspecified: Secondary | ICD-10-CM

## 2024-01-27 ENCOUNTER — Telehealth (INDEPENDENT_AMBULATORY_CARE_PROVIDER_SITE_OTHER): Admitting: Family Medicine

## 2024-01-27 ENCOUNTER — Encounter: Payer: Self-pay | Admitting: Family Medicine

## 2024-01-27 DIAGNOSIS — F410 Panic disorder [episodic paroxysmal anxiety] without agoraphobia: Secondary | ICD-10-CM

## 2024-01-27 DIAGNOSIS — F411 Generalized anxiety disorder: Secondary | ICD-10-CM

## 2024-01-27 MED ORDER — QUETIAPINE FUMARATE 100 MG PO TABS: 100.0000 mg | ORAL_TABLET | Freq: Every day | ORAL | 0 refills | Status: DC

## 2024-01-27 MED ORDER — BUSPIRONE HCL 7.5 MG PO TABS: 7.5000 mg | ORAL_TABLET | Freq: Two times a day (BID) | ORAL | 3 refills | Status: DC

## 2024-01-27 NOTE — Progress Notes (Signed)
 Virtual Visit via Video Note  I connected with Kelly Matthews on 01/27/24 at  3:00 PM EDT by a video enabled telemedicine application and verified that I am speaking with the correct person using two identifiers.  Patient Location: Other:  in car Provider Location: Office/Clinic  I discussed the limitations, risks, security, and privacy concerns of performing an evaluation and management service by video and the availability of in person appointments. I also discussed with the patient that there may be a patient responsible charge related to this service. The patient expressed understanding and agreed to proceed.  Subjective: PCP: Sallee Provencal, FNP  No chief complaint on file.  The patient is a 58 year old who presents with anxiety and panic attacks- concerns for medication management.  She experiences anxiety and panic attacks, occasionally accompanied by depression. Despite taking Seroquel 150 mg nightly, which aids her sleep, she continues to struggle with anxiety and panic attacks.  She was seen by a behavioral health specialist on December 23, 2023, who prescribed Prozac 20 mg daily and to taper down off of seroquel. However, she has not started it due to concerns about discontinuing Seroquel, which she finds beneficial for sleep. She has heard negative things about Prozac and is apprehensive about the change in medication. Previously, she tried Zoloft and Paxil for anxiety, but neither was effective. She is seeking an alternative medication to manage her anxiety and panic attacks while maintaining her sleep quality with seroquel. She does not want to see Dr. Vanetta Shawl again, had a poor experience with her. Also she cannot see another psychiatrist at the current clinic due to a conflict of interest, as her brother is a patient there. She is looking for a provider that accepts her insurance.      ROS: Per HPI  Current Outpatient Medications:    busPIRone (BUSPAR) 7.5 MG  tablet, Take 1 tablet (7.5 mg total) by mouth 2 (two) times daily., Disp: 60 tablet, Rfl: 3   QUEtiapine (SEROQUEL) 100 MG tablet, Take 1 tablet (100 mg total) by mouth at bedtime., Disp: 90 tablet, Rfl: 0   benzonatate (TESSALON) 100 MG capsule, Take 1-2 capsules (100-200 mg total) by mouth 3 (three) times daily as needed., Disp: 30 capsule, Rfl: 0   Continuous Blood Gluc Receiver (DEXCOM G6 RECEIVER) DEVI, USE FOR CONTINUOUS BLOOD GLUCOSE MONITORING, Disp: , Rfl:    Continuous Blood Gluc Sensor (DEXCOM G6 SENSOR) MISC, USE FOR CONTINUOUS BLOOD GLUCOSE MONITORING REPLACE SENSOR EVERY 10 DAYS, Disp: , Rfl:    Continuous Blood Gluc Transmit (DEXCOM G6 TRANSMITTER) MISC, USE FOR CONTINUOUS BLOOD GLUCOSE MONITORING REPLACE TRANSMITTER EVERY 90 DAYS, Disp: , Rfl:    FLUoxetine (PROZAC) 20 MG capsule, Take 1 capsule (20 mg total) by mouth daily., Disp: 30 capsule, Rfl: 1   JARDIANCE 25 MG TABS tablet, Take 25 mg by mouth daily., Disp: , Rfl:    meclizine (ANTIVERT) 25 MG tablet, TAKE 1 TABLET(25 MG) BY MOUTH THREE TIMES DAILY AS NEEDED FOR DIZZINESS, Disp: 90 tablet, Rfl: 0   metFORMIN (GLUCOPHAGE) 1000 MG tablet, Take 1 tablet by mouth 2 (two) times daily with a meal., Disp: , Rfl:    MOUNJARO 5 MG/0.5ML Pen, SMARTSIG:5 Milligram(s) SUB-Q Once a Week, Disp: , Rfl:    NOVOLOG FLEXPEN 100 UNIT/ML FlexPen, INJECT UP TO 60 UNITS DAILY IN DIVIDED DOSES AS DIRECTED, Disp: , Rfl:    rosuvastatin (CRESTOR) 20 MG tablet, Take 1 tablet (20 mg total) by mouth daily., Disp: 90  tablet, Rfl: 3   valsartan (DIOVAN) 160 MG tablet, Take 1 tablet (160 mg total) by mouth daily., Disp: 30 tablet, Rfl: 2  Observations/Objective: There were no vitals filed for this visit. Physical Exam Constitutional:      General: She is not in acute distress.    Appearance: Normal appearance. She is not ill-appearing, toxic-appearing or diaphoretic.  Eyes:     Extraocular Movements: Extraocular movements intact.  Pulmonary:      Effort: Pulmonary effort is normal.     Breath sounds: Normal breath sounds.  Skin:    General: Skin is dry.  Neurological:     General: No focal deficit present.     Mental Status: She is alert and oriented to person, place, and time. Mental status is at baseline.  Psychiatric:        Attention and Perception: Attention normal.        Mood and Affect: Mood normal. Affect is flat.        Speech: Speech normal.        Behavior: Behavior is withdrawn.        Thought Content: Thought content normal.        Cognition and Memory: Cognition and memory normal.        Judgment: Judgment normal.     Assessment and Plan: Generalized anxiety disorder with panic attacks Assessment & Plan: Anxiety and panic attacks persist despite Seroquel.  States seroquel helps with her sleep but not with her everyday anxiety and panic Had seen psych, Dr. Vanetta Shawl 12/23/23 who suggested starting prozac and tapering off seroquel Pt did not like the rapport with the provider and did not like the plan  She never stopped the seroquel and did not start the prozac She is concerned about prozac and does not want to take it Previous Zoloft and Paxil trials ineffective.  Buspar suggested as alternative for anti-anxiety and panic. - Prescribe Buspar 7.5mg  twice daily for anxiety. - Reduce Seroquel to 100 mg nightly. - Refer to a different psychiatrist for further management. - Follow up in 4 weeks to assess response to medication changes - may be virtual.  Orders: -     busPIRone HCl; Take 1 tablet (7.5 mg total) by mouth 2 (two) times daily.  Dispense: 60 tablet; Refill: 3 -     QUEtiapine Fumarate; Take 1 tablet (100 mg total) by mouth at bedtime.  Dispense: 90 tablet; Refill: 0 -     Ambulatory referral to Psychiatry   States she is up to date with all other medications and prescriptions  Recommend she also follow up in person for BP check, weight check, and labs. Post virtual mood visit  Follow Up  Instructions: Return in about 4 weeks (around 02/24/2024) for virtual mood check.   I discussed the assessment and treatment plan with the patient. The patient was provided an opportunity to ask questions, and all were answered. The patient agreed with the plan and demonstrated an understanding of the instructions.   The patient was advised to call back or seek an in-person evaluation if the symptoms worsen or if the condition fails to improve as anticipated.  The above assessment and management plan was discussed with the patient. The patient verbalized understanding of and has agreed to the management plan.   Sallee Provencal, FNP

## 2024-01-27 NOTE — Assessment & Plan Note (Signed)
 Anxiety and panic attacks persist despite Seroquel.  States seroquel helps with her sleep but not with her everyday anxiety and panic Had seen psych, Dr. Vanetta Shawl 12/23/23 who suggested starting prozac and tapering off seroquel Pt did not like the rapport with the provider and did not like the plan  She never stopped the seroquel and did not start the prozac She is concerned about prozac and does not want to take it Previous Zoloft and Paxil trials ineffective.  Buspar suggested as alternative for anti-anxiety and panic. - Prescribe Buspar 7.5mg  twice daily for anxiety. - Reduce Seroquel to 100 mg nightly. - Refer to a different psychiatrist for further management. - Follow up in 4 weeks to assess response to medication changes - may be virtual.
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
## 2024-02-13 ENCOUNTER — Telehealth: Admitting: Nurse Practitioner

## 2024-02-13 DIAGNOSIS — J069 Acute upper respiratory infection, unspecified: Secondary | ICD-10-CM | POA: Diagnosis not present

## 2024-02-13 MED ORDER — IPRATROPIUM BROMIDE 0.03 % NA SOLN: 2.0000 | Freq: Two times a day (BID) | NASAL | 0 refills | Status: DC

## 2024-02-13 MED ORDER — BENZONATATE 100 MG PO CAPS: 100.0000 mg | ORAL_CAPSULE | Freq: Three times a day (TID) | ORAL | 0 refills | Status: AC | PRN

## 2024-02-13 NOTE — Progress Notes (Signed)
 Providers prescribe antibiotics to treat infections caused by bacteria. Antibiotics are very powerful in treating bacterial infections when they are used properly. To maintain their effectiveness, they should be used only when necessary. Overuse of antibiotics has resulted in the development of superbugs that are resistant to treatment!    After careful review of your answers, I would not recommend an antibiotic for your condition.  Antibiotics are not effective against viruses and therefore should not be used to treat them. Common examples of infections caused by viruses include colds and flu    E-Visit for Upper Respiratory Infection   We are sorry you are not feeling well.  Here is how we plan to help!  Based on what you have shared with me, it looks like you may have a viral upper respiratory infection.  Upper respiratory infections are caused by a large number of viruses; however, rhinovirus is the most common cause.   Symptoms vary from person to person, with common symptoms including sore throat, cough, fatigue or lack of energy and feeling of general discomfort.  A low-grade fever of up to 100.4 may present, but is often uncommon.  Symptoms vary however, and are closely related to a person's age or underlying illnesses.  The most common symptoms associated with an upper respiratory infection are nasal discharge or congestion, cough, sneezing, headache and pressure in the ears and face.  These symptoms usually persist for about 3 to 10 days, but can last up to 2 weeks.  It is important to know that upper respiratory infections do not cause serious illness or complications in most cases.    Upper respiratory infections can be transmitted from person to person, with the most common method of transmission being a person's hands.  The virus is able to live on the skin and can infect other persons for up to 2 hours after direct contact.  Also, these can be transmitted when someone coughs or sneezes;  thus, it is important to cover the mouth to reduce this risk.  To keep the spread of the illness at bay, good hand hygiene is very important.  This is an infection that is most likely caused by a virus. There are no specific treatments other than to help you with the symptoms until the infection runs its course.  We are sorry you are not feeling well.  Here is how we plan to help!   For nasal congestion, you may use an oral decongestants such as Mucinex D or if you have glaucoma or high blood pressure use plain Mucinex.  Saline nasal spray or nasal drops can help and can safely be used as often as needed for congestion.  For your congestion, I have prescribed Ipratropium Bromide nasal spray 0.03% two sprays in each nostril 2-3 times a day  If you do not have a history of heart disease, hypertension, diabetes or thyroid disease, prostate/bladder issues or glaucoma, you may also use Sudafed to treat nasal congestion.  It is highly recommended that you consult with a pharmacist or your primary care physician to ensure this medication is safe for you to take.     If you have a cough, you may use cough suppressants such as Delsym and Robitussin.  If you have glaucoma or high blood pressure, you can also use Coricidin HBP.   For cough I have prescribed for you A prescription cough medication called Tessalon Perles 100 mg. You may take 1-2 capsules every 8 hours as needed for cough You  can take LIQUID motrin and tylenol for sore throat pain.   If you have a sore or scratchy throat, use a saltwater gargle-  to  teaspoon of salt dissolved in a 4-ounce to 8-ounce glass of warm water.  Gargle the solution for approximately 15-30 seconds and then spit.  It is important not to swallow the solution.  You can also use throat lozenges/cough drops and Chloraseptic spray to help with throat pain or discomfort.  Warm or cold liquids can also be helpful in relieving throat pain.  For headache, pain or general  discomfort, you can use Ibuprofen or Tylenol as directed.   Some authorities believe that zinc sprays or the use of Echinacea may shorten the course of your symptoms.   HOME CARE Only take medications as instructed by your medical team. Be sure to drink plenty of fluids. Water is fine as well as fruit juices, sodas and electrolyte beverages. You may want to stay away from caffeine or alcohol. If you are nauseated, try taking small sips of liquids. How do you know if you are getting enough fluid? Your urine should be a pale yellow or almost colorless. Get rest. Taking a steamy shower or using a humidifier may help nasal congestion and ease sore throat pain. You can place a towel over your head and breathe in the steam from hot water coming from a faucet. Using a saline nasal spray works much the same way. Cough drops, hard candies and sore throat lozenges may ease your cough. Avoid close contacts especially the very young and the elderly Cover your mouth if you cough or sneeze Always remember to wash your hands.   GET HELP RIGHT AWAY IF: You develop worsening fever. If your symptoms do not improve within 10 days You develop yellow or green discharge from your nose over 3 days. You have coughing fits You develop a severe head ache or visual changes. You develop shortness of breath, difficulty breathing or start having chest pain Your symptoms persist after you have completed your treatment plan  MAKE SURE YOU  Understand these instructions. Will watch your condition. Will get help right away if you are not doing well or get worse.  Thank you for choosing an e-visit.  Your e-visit answers were reviewed by a board certified advanced clinical practitioner to complete your personal care plan. Depending upon the condition, your plan could have included both over the counter or prescription medications.  Please review your pharmacy choice. Make sure the pharmacy is open so you can pick up  prescription now. If there is a problem, you may contact your provider through [employer] and have the prescription routed to another pharmacy.  Your safety is important to [place]. If you have drug allergies check your prescription carefully.   For the next 24 hours you can use MyChart to ask questions about today's visit, request a non-urgent call back, or ask for a work or school excuse. You will get an email in the next two days asking about your experience. I hope that your e-visit has been valuable and will speed your recovery.

## 2024-02-13 NOTE — Progress Notes (Signed)
 I have spent 5 minutes in review of e-visit questionnaire, review and updating patient chart, medical decision making and response to patient.   Claiborne Rigg, NP

## 2024-02-16 ENCOUNTER — Telehealth: Payer: Self-pay | Admitting: Family Medicine

## 2024-02-16 DIAGNOSIS — F410 Panic disorder [episodic paroxysmal anxiety] without agoraphobia: Secondary | ICD-10-CM

## 2024-02-16 MED ORDER — QUETIAPINE FUMARATE 100 MG PO TABS: 100.0000 mg | ORAL_TABLET | Freq: Every day | ORAL | 0 refills | Status: AC

## 2024-02-16 NOTE — Telephone Encounter (Signed)
 [facility] faxed refill request for the following medications:   QUEtiapine (SEROQUEL) 100 MG tablet     Please advise.

## 2024-02-17 ENCOUNTER — Telehealth: Payer: Self-pay | Admitting: Psychiatry

## 2024-02-17 ENCOUNTER — Telehealth: Payer: 59 | Admitting: Psychiatry

## 2024-02-17 NOTE — Telephone Encounter (Signed)
 Left message on 02-14-24 to confirm that she is not able to come to appointment on 02-17-24. Patient has not responded. Also in the note from her pcp it states she has concerns with medication she was put on and does not wish to return. I have cancelled her appointment for today since she has not returned calls.

## 2024-02-19 ENCOUNTER — Telehealth: Admitting: Nurse Practitioner

## 2024-02-19 DIAGNOSIS — J40 Bronchitis, not specified as acute or chronic: Secondary | ICD-10-CM

## 2024-02-19 MED ORDER — DOXYCYCLINE HYCLATE 100 MG PO TABS: 100.0000 mg | ORAL_TABLET | Freq: Two times a day (BID) | ORAL | 0 refills | Status: AC

## 2024-02-19 NOTE — Progress Notes (Signed)
 E-Visit for Cough   We are sorry that you are not feeling well.  Here is how we plan to help!  Based on your presentation I believe you most likely have A cough due to bacteria.  When patients have a fever and a productive cough with a change in color or increased sputum production, we are concerned about bacterial bronchitis.  If left untreated it can progress to pneumonia.  If your symptoms do not improve with your treatment plan it is important that you contact your provider.   I have prescribed Azithromyin 250 mg: two tablets now and then one tablet daily for 4 additonal days    In addition you may continue to use your nasal spray and allergy medication. If your cough continues I recommend a face to face visit with your PCP  From your responses in the eVisit questionnaire you describe inflammation in the upper respiratory tract which is causing a significant cough.  This is commonly called Bronchitis and has four common causes:   Allergies Viral Infections Acid Reflux Bacterial Infection Allergies, viruses and acid reflux are treated by controlling symptoms or eliminating the cause. An example might be a cough caused by taking certain blood pressure medications. You stop the cough by changing the medication. Another example might be a cough caused by acid reflux. Controlling the reflux helps control the cough.  USE OF BRONCHODILATOR ("RESCUE") INHALERS: There is a risk from using your bronchodilator too frequently.  The risk is that over-reliance on a medication which only relaxes the muscles surrounding the breathing tubes can reduce the effectiveness of medications prescribed to reduce swelling and congestion of the tubes themselves.  Although you feel brief relief from the bronchodilator inhaler, your asthma may actually be worsening with the tubes becoming more swollen and filled with mucus.  This can delay other crucial treatments, such as oral steroid medications. If you need to use a  bronchodilator inhaler daily, several times per day, you should discuss this with your provider.  There are probably better treatments that could be used to keep your asthma under control.     HOME CARE Only take medications as instructed by your medical team. Complete the entire course of an antibiotic. Drink plenty of fluids and get plenty of rest. Avoid close contacts especially the very young and the elderly Cover your mouth if you cough or cough into your sleeve. Always remember to wash your hands A steam or ultrasonic humidifier can help congestion.   GET HELP RIGHT AWAY IF: You develop worsening fever. You become short of breath You cough up blood. Your symptoms persist after you have completed your treatment plan MAKE SURE YOU  Understand these instructions. Will watch your condition. Will get help right away if you are not doing well or get worse.    Thank you for choosing an e-visit.  Your e-visit answers were reviewed by a board certified advanced clinical practitioner to complete your personal care plan. Depending upon the condition, your plan could have included both over the counter or prescription medications.  Please review your pharmacy choice. Make sure the pharmacy is open so you can pick up prescription now. If there is a problem, you may contact your provider through [employer] and have the prescription routed to another pharmacy.  Your safety is important to [place]. If you have drug allergies check your prescription carefully.   For the next 24 hours you can use MyChart to ask questions about today's visit, request a  non-urgent call back, or ask for a work or school excuse. You will get an email in the next two days asking about your experience. I hope that your e-visit has been valuable and will speed your recovery.

## 2024-02-19 NOTE — Progress Notes (Signed)
 I have spent 5 minutes in review of e-visit questionnaire, review and updating patient chart, medical decision making and response to patient.   Claiborne Rigg, NP

## 2024-02-21 ENCOUNTER — Ambulatory Visit: Payer: 59 | Admitting: Licensed Clinical Social Worker

## 2024-03-01 ENCOUNTER — Telehealth: Payer: Self-pay | Admitting: Family Medicine

## 2024-03-01 NOTE — Telephone Encounter (Signed)
 [facility]cl[facility]ine (ANTIVERT) 25 MG tablet   Please advise

## 2024-03-03 ENCOUNTER — Other Ambulatory Visit: Payer: Self-pay

## 2024-03-03 ENCOUNTER — Telehealth: Payer: Self-pay | Admitting: Family Medicine

## 2024-03-03 MED ORDER — MECLIZINE HCL 25 MG PO TABS: 25.0000 mg | ORAL_TABLET | Freq: Three times a day (TID) | ORAL | 0 refills | Status: AC | PRN

## 2024-03-03 NOTE — Telephone Encounter (Signed)
[facility] for the following medications:   meclizine (ANTIVERT) 25 MG tablet     Please advise

## 2024-03-03 NOTE — Telephone Encounter (Signed)
 Refill request sent to Dr Sherrie Mustache

## 2024-03-30 ENCOUNTER — Telehealth: Admitting: Physician Assistant

## 2024-03-30 DIAGNOSIS — J019 Acute sinusitis, unspecified: Secondary | ICD-10-CM | POA: Diagnosis not present

## 2024-03-30 DIAGNOSIS — B9789 Other viral agents as the cause of diseases classified elsewhere: Secondary | ICD-10-CM

## 2024-03-30 MED ORDER — IPRATROPIUM BROMIDE 0.03 % NA SOLN: 2.0000 | Freq: Two times a day (BID) | NASAL | 0 refills | Status: AC

## 2024-03-30 NOTE — Progress Notes (Signed)
 I have spent 5 minutes in review of e-visit questionnaire, review and updating patient chart, medical decision making and response to patient.   Piedad Climes, PA-C

## 2024-03-30 NOTE — Progress Notes (Signed)
E-Visit for Sinus Problems  We are sorry that you are not feeling well.  Here is how we plan to help!  Based on what you have shared with me it looks like you have sinusitis.  Sinusitis is inflammation and infection in the sinus cavities of the head.  Based on your presentation I believe you most likely have Acute Viral Sinusitis.This is an infection most likely caused by a virus. There is not specific treatment for viral sinusitis other than to help you with the symptoms until the infection runs its course.  You may use an oral decongestant such as Mucinex D or if you have glaucoma or high blood pressure use plain Mucinex. Saline nasal spray help and can safely be used as often as needed for congestion, I have prescribed: Ipratropium Bromide nasal spray 0.03% 2 sprays in eah nostril 2-3 times a day  Some authorities believe that zinc sprays or the use of Echinacea may shorten the course of your symptoms.  Sinus infections are not as easily transmitted as other respiratory infection, however we still recommend that you avoid close contact with loved ones, especially the very young and elderly.  Remember to wash your hands thoroughly throughout the day as this is the number one way to prevent the spread of infection!  I have sent a work note to Pharmacologist. You can find by going to the Menu on your homepage, scrolling down to the Communications section, and selecting Letters. Let us know if you have any issue locating. Take care and feel better soon!   Home Care: Only take medications as instructed by your medical team. Do not take these medications with alcohol. A steam or ultrasonic humidifier can help congestion.  You can place a towel over your head and breathe in the steam from hot water coming from a faucet. Avoid close contacts especially the very young and the elderly. Cover your mouth when you cough or sneeze. Always remember to wash your hands.  Get Help Right Away If: You develop  worsening fever or sinus pain. You develop a severe head ache or visual changes. Your symptoms persist after you have completed your treatment plan.  Make sure you Understand these instructions. Will watch your condition. Will get help right away if you are not doing well or get worse.   Thank you for choosing an e-visit.  Your e-visit answers were reviewed by a board certified advanced clinical practitioner to complete your personal care plan. Depending upon the condition, your plan could have included both over the counter or prescription medications.  Please review your pharmacy choice. Make sure the pharmacy is open so you can pick up prescription now. If there is a problem, you may contact your provider through [employer] and have the prescription routed to another pharmacy.  Your safety is important to [place]. If you have drug allergies check your prescription carefully.   For the next 24 hours you can use MyChart to ask questions about today's visit, request a non-urgent call back, or ask for a work or school excuse. You will get an email in the next two days asking about your experience. I hope that your e-visit has been valuable and will speed your recovery.

## 2024-04-03 ENCOUNTER — Telehealth: Payer: Self-pay | Admitting: Family Medicine

## 2024-04-03 NOTE — Telephone Encounter (Signed)
 Copied from CRM [id number]. Topic: Referral - Status >> Apr 03, 2024  2:06 PM Georgeann Kindred wrote: Reason for CRM: Patient requesting referral for [facility] to be re-faxed over to the office as they are stating they did not receive the referral please fax referral to FAX: [phone]

## 2024-05-08 DIAGNOSIS — E669 Obesity, unspecified: Secondary | ICD-10-CM | POA: Diagnosis not present

## 2024-05-08 DIAGNOSIS — E1169 Type 2 diabetes mellitus with other specified complication: Secondary | ICD-10-CM | POA: Diagnosis not present

## 2024-06-04 ENCOUNTER — Other Ambulatory Visit: Payer: Self-pay | Admitting: Family Medicine

## 2024-06-04 DIAGNOSIS — F410 Panic disorder [episodic paroxysmal anxiety] without agoraphobia: Secondary | ICD-10-CM

## 2024-06-17 ENCOUNTER — Other Ambulatory Visit: Payer: Self-pay | Admitting: Family Medicine

## 2024-06-17 DIAGNOSIS — F410 Panic disorder [episodic paroxysmal anxiety] without agoraphobia: Secondary | ICD-10-CM

## 2024-07-26 ENCOUNTER — Telehealth: Admitting: Physician Assistant

## 2024-07-26 DIAGNOSIS — J019 Acute sinusitis, unspecified: Secondary | ICD-10-CM | POA: Diagnosis not present

## 2024-07-26 DIAGNOSIS — B9689 Other specified bacterial agents as the cause of diseases classified elsewhere: Secondary | ICD-10-CM | POA: Diagnosis not present

## 2024-07-26 MED ORDER — DOXYCYCLINE HYCLATE 100 MG PO TABS: 100.0000 mg | ORAL_TABLET | Freq: Two times a day (BID) | ORAL | 0 refills | Status: AC

## 2024-07-26 NOTE — Progress Notes (Signed)
 I have spent 5 minutes in review of e-visit questionnaire, review and updating patient chart, medical decision making and response to patient.   Elsie Velma Lunger, PA-C

## 2024-07-26 NOTE — Progress Notes (Signed)
 E-Visit for Sinus Problems  We are sorry that you are not feeling well.  Here is how we plan to help!  Based on what you have shared with me it looks like you have sinusitis.  Sinusitis is inflammation and infection in the sinus cavities of the head.  Based on your presentation I believe you most likely have Acute Bacterial Sinusitis.  This is an infection caused by bacteria and is treated with antibiotics. I have prescribed Doxycycline  100mg  by mouth twice a day for 10 days. You may use an oral decongestant such as Mucinex D or if you have glaucoma or high blood pressure use plain Mucinex. Saline nasal spray help and can safely be used as often as needed for congestion.  If you develop worsening sinus pain, fever or notice severe headache and vision changes, or if symptoms are not better after completion of antibiotic, please schedule an appointment with a health care provider.    Sinus infections are not as easily transmitted as other respiratory infection, however we still recommend that you avoid close contact with loved ones, especially the very young and elderly.  Remember to wash your hands thoroughly throughout the day as this is the number one way to prevent the spread of infection!  I have sent a work note to Pharmacologist. You can find by going to the Menu on your homepage, scrolling down to the Communications section, and selecting Letters. Let us  know if you have any issue locating. Take care and feel better soon!   Home Care: Only take medications as instructed by your medical team. Complete the entire course of an antibiotic. Do not take these medications with alcohol. A steam or ultrasonic humidifier can help congestion.  You can place a towel over your head and breathe in the steam from hot water coming from a faucet. Avoid close contacts especially the very young and the elderly. Cover your mouth when you cough or sneeze. Always remember to wash your hands.  Get Help Right Away  If: You develop worsening fever or sinus pain. You develop a severe head ache or visual changes. Your symptoms persist after you have completed your treatment plan.  Make sure you Understand these instructions. Will watch your condition. Will get help right away if you are not doing well or get worse.  Thank you for choosing an e-visit.  Your e-visit answers were reviewed by a board certified advanced clinical practitioner to complete your personal care plan. Depending upon the condition, your plan could have included both over the counter or prescription medications.  Please review your pharmacy choice. Make sure the pharmacy is open so you can pick up prescription now. If there is a problem, you may contact your provider through [employer] and have the prescription routed to another pharmacy.  Your safety is important to us . If you have drug allergies check your prescription carefully.   For the next 24 hours you can use MyChart to ask questions about today's visit, request a non-urgent call back, or ask for a work or school excuse. You will get an email in the next two days asking about your experience. I hope that your e-visit has been valuable and will speed your recovery.

## 2024-07-28 ENCOUNTER — Telehealth: Admitting: Emergency Medicine

## 2024-07-28 DIAGNOSIS — J3489 Other specified disorders of nose and nasal sinuses: Secondary | ICD-10-CM

## 2024-07-28 NOTE — Progress Notes (Signed)
  Because you are worsening despite antibiotic therapy, I feel your condition warrants further evaluation and I recommend that you be seen in a face-to-face visit.  You may need additional testing, which cannot be done through an e-visit, such as blood work or imaging.     NOTE: There will be NO CHARGE for this E-Visit   If you are having a true medical emergency, please call 911.     For an urgent face to face visit, [facility] has multiple urgent care centers for your convenience.  Click the link below for the full list of locations and hours, walk-in wait times, appointment scheduling options and driving directions:  Urgent Care - [place], [place], [place], [place], [place], [place]  [place]     Your MyChart E-visit questionnaire answers were reviewed by a board certified advanced clinical practitioner to complete your personal care plan based on your specific symptoms.    Thank you for using e-Visits.

## 2024-09-28 ENCOUNTER — Emergency Department
Admission: EM | Admit: 2024-09-28 | Discharge: 2024-09-28 | Disposition: A | Discharge status: Home / Self Care | Attending: Emergency Medicine | Admitting: Emergency Medicine

## 2024-09-28 ENCOUNTER — Other Ambulatory Visit: Payer: Self-pay

## 2024-09-28 ENCOUNTER — Emergency Department

## 2024-09-28 DIAGNOSIS — E119 Type 2 diabetes mellitus without complications: Secondary | ICD-10-CM | POA: Diagnosis not present

## 2024-09-28 DIAGNOSIS — N390 Urinary tract infection, site not specified: Secondary | ICD-10-CM | POA: Diagnosis not present

## 2024-09-28 DIAGNOSIS — R519 Headache, unspecified: Secondary | ICD-10-CM | POA: Insufficient documentation

## 2024-09-28 DIAGNOSIS — I10 Essential (primary) hypertension: Secondary | ICD-10-CM | POA: Insufficient documentation

## 2024-09-28 LAB — COMPREHENSIVE METABOLIC PANEL WITH GFR
ALT: 37 U/L (ref 0–44)
AST: 28 U/L (ref 15–41)
Albumin: 4.2 g/dL (ref 3.5–5.0)
Alkaline Phosphatase: 99 U/L (ref 38–126)
Anion gap: 12 (ref 5–15)
BUN: 10 mg/dL (ref 6–20)
CO2: 24 mmol/L (ref 22–32)
Calcium: 9.7 mg/dL (ref 8.9–10.3)
Chloride: 105 mmol/L (ref 98–111)
Creatinine, Ser: 0.57 mg/dL (ref 0.44–1.00)
GFR, Estimated: >60 mL/min (ref >60)
Glucose, Bld: 174 mg/dL — ABNORMAL HIGH (ref 70–99)
Potassium: 3.5 mmol/L (ref 3.5–5.1)
Sodium: 140 mmol/L (ref 135–145)
Total Bilirubin: 0.4 mg/dL (ref 0.0–1.2)
Total Protein: 6.6 g/dL (ref 6.5–8.1)

## 2024-09-28 LAB — CBC WITH DIFFERENTIAL/PLATELET
Abs Immature Granulocytes: 0.04 K/uL (ref 0.00–0.07)
Basophils Absolute: 0 K/uL (ref 0.0–0.1)
Basophils Relative: 1 %
Eosinophils Absolute: 0 K/uL (ref 0.0–0.5)
Eosinophils Relative: 1 %
HCT: 37.8 % (ref 36.0–46.0)
Hemoglobin: 11.8 g/dL — ABNORMAL LOW (ref 12.0–15.0)
Immature Granulocytes: 1 %
Lymphocytes Relative: 15 %
Lymphs Abs: 1.2 K/uL (ref 0.7–4.0)
MCH: 24.8 pg — ABNORMAL LOW (ref 26.0–34.0)
MCHC: 31.2 g/dL (ref 30.0–36.0)
MCV: 79.4 fL — ABNORMAL LOW (ref 80.0–100.0)
Monocytes Absolute: 0.3 K/uL (ref 0.1–1.0)
Monocytes Relative: 4 %
Neutro Abs: 6.1 K/uL (ref 1.7–7.7)
Neutrophils Relative %: 78 %
Platelets: 169 K/uL (ref 150–400)
RBC: 4.76 MIL/uL (ref 3.87–5.11)
RDW: 14.6 % (ref 11.5–15.5)
WBC: 7.7 K/uL (ref 4.0–10.5)
nRBC: 0 % (ref 0.0–0.2)

## 2024-09-28 LAB — RESP PANEL BY RT-PCR (RSV, FLU A&B, COVID)  RVPGX2
Influenza A by PCR: NEGATIVE
Influenza B by PCR: NEGATIVE
Resp Syncytial Virus by PCR: NEGATIVE
SARS Coronavirus 2 by RT PCR: NEGATIVE

## 2024-09-28 LAB — URINALYSIS, W/ REFLEX TO CULTURE (INFECTION SUSPECTED)
Bilirubin Urine: NEGATIVE
Glucose, UA: NEGATIVE mg/dL
Hgb urine dipstick: NEGATIVE
Ketones, ur: NEGATIVE mg/dL
Nitrite: NEGATIVE
Protein, ur: NEGATIVE mg/dL
Specific Gravity, Urine: 1.013 (ref 1.005–1.030)
pH: 5 (ref 5.0–8.0)

## 2024-09-28 MED ORDER — CEPHALEXIN 500 MG PO CAPS: 500.0000 mg | ORAL_CAPSULE | Freq: Four times a day (QID) | ORAL | 0 refills | Status: AC

## 2024-09-28 MED ORDER — KETOROLAC TROMETHAMINE 15 MG/ML IJ SOLN
15.0000 mg | Freq: Once | INTRAMUSCULAR | Status: AC
  Administered 2024-09-28: 15 mg via INTRAMUSCULAR
  Filled 2024-09-28: qty 1

## 2024-09-28 NOTE — ED Notes (Signed)
 See triage note  Presents with headache ,congestion and just not feeling well  sxs's started 2 weeks ago Afebrile on arrival

## 2024-09-28 NOTE — ED Triage Notes (Signed)
 Patient states headaches for two weeks; also reports feeling out of it for several months. No relief with Tylenol  or Motrin .

## 2024-09-28 NOTE — ED Provider Notes (Signed)
 [facility] Provider Note    Event Date/Time   First MD Initiated Contact with Patient 09/28/24 [phone]     (approximate)   History   Headache   HPI  Kelly Matthews is a 58 y.o. female who presents today for evaluation of intermittent headaches, and feeling spacey for the last several months.  No visual changes.  No difficulty speaking or walking.  Reports that occasionally if she feels unsteady but not currently.  No chest pain or shortness of breath.  No nausea or vomiting.  No abdominal pain.  No fevers or chills.  She also reports URI with stuffy nose for the past 1 week.  Patient Active Problem List   Diagnosis Date Noted   Vertigo 10/26/2023   Acute abdominal pain 10/26/2023   Hypertension associated with diabetes (HCC) 10/26/2023   Hyperlipidemia associated with type 2 diabetes mellitus (HCC) 10/26/2023   Generalized anxiety disorder with panic attacks 10/26/2023   Anxiety 10/13/2023   Panic attacks 10/13/2023   Psoriatic arthritis (HCC) 10/13/2023   Upper airway cough syndrome vs asthmatic bronchitis 11/06/2022   Bright red rectal bleeding 08/25/2021   History of colonic polyps    Chronic bilateral low back pain with left-sided sciatica 03/20/2020   Muscle spasm of back 02/05/2020   Muscle cramp 02/05/2020   Left hip pain 02/05/2020   Psoriasis 03/13/2019   Stomach irritation    Abdominal pain, epigastric    Special screening for malignant neoplasms, colon    Polyp of descending colon    Right leg pain 02/24/2018   Women's annual routine gynecological examination 05/14/2017   Malnutrition of moderate degree 12/07/2016   DKA (diabetic ketoacidosis) (HCC) 12/05/2016   Migraine headache 05/05/2016   Allergic rhinitis 07/23/2015   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   Airway hyperreactivity 07/19/2015   Borderline diabetes 07/19/2015   Diabetes (HCC) 11/28/2014   Aortic heart valve narrowing 11/28/2014   Supraventricular  tachycardia (HCC) 11/28/2014   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          Physical Exam   Triage Vital Signs: ED Triage Vitals  Encounter Vitals Group     BP 09/28/24 0751 (!) 157/82     Girls Systolic BP Percentile --      Girls Diastolic BP Percentile --      Boys Systolic BP Percentile --      Boys Diastolic BP Percentile --      Pulse Rate 09/28/24 0751 90     Resp 09/28/24 0751 18     Temp 09/28/24 0751 97.7 F (36.5 C)     Temp Source 09/28/24 0751 Oral     SpO2 09/28/24 0751 100 %     Weight 09/28/24 0750 198 lb (89.8 kg)     Height 09/28/24 0750 5' (1.524 m)     Head Circumference --      Peak Flow --      Pain Score --      Pain Loc --      Pain Education --      Exclude from Growth Chart --     Most recent vital signs: Vitals:   09/28/24 0751  BP: (!) 157/82  Pulse: 90  Resp: 18  Temp: 97.7 F (36.5 C)  SpO2: 100%    Physical Exam Vitals and nursing note reviewed.  Constitutional:      General: Awake and alert. No acute distress.    Appearance: Normal appearance. The patient is normal  weight.  HENT:     Head: Normocephalic and atraumatic.     Mouth: Mucous membranes are moist.  Eyes:     General: PERRL. Normal EOMs        Right eye: No discharge.        Left eye: No discharge.     Conjunctiva/sclera: Conjunctivae normal.  Cardiovascular:     Rate and Rhythm: Normal rate and regular rhythm.     Pulses: Normal pulses.  Pulmonary:     Effort: Pulmonary effort is normal. No respiratory distress.     Breath sounds: Normal breath sounds.  Abdominal:     Abdomen is soft. There is no abdominal tenderness. No rebound or guarding. No distention. Musculoskeletal:        General: No swelling. Normal range of motion.     Cervical back: Normal range of motion and neck supple.  Skin:    General: Skin is warm and dry.     Capillary Refill: Capillary refill takes less than 2 seconds.     Findings: No rash.  Neurological:     Mental Status:  The patient is awake and alert.   Neurological: GCS 15 alert and oriented x3 Normal speech, no expressive or receptive aphasia or dysarthria Cranial nerves II through XII intact Normal visual fields 5 out of 5 strength in all 4 extremities with intact sensation throughout No extremity drift Normal finger-to-nose testing, no limb or truncal ataxia Normal gait Negative Dix-Hallpike   ED Results / Procedures / Treatments   Labs (all labs ordered are listed, but only abnormal results are displayed) Labs Reviewed  CBC WITH DIFFERENTIAL/PLATELET - Abnormal; Notable for the following components:      Result Value   Hemoglobin 11.8 (*)    MCV 79.4 (*)    MCH 24.8 (*)    All other components within normal limits  COMPREHENSIVE METABOLIC PANEL WITH GFR - Abnormal; Notable for the following components:   Glucose, Bld 174 (*)    All other components within normal limits  URINALYSIS, W/ REFLEX TO CULTURE (INFECTION SUSPECTED) - Abnormal; Notable for the following components:   Color, Urine YELLOW (*)    APPearance HAZY (*)    Leukocytes,Ua MODERATE (*)    All other components within normal limits  RESP PANEL BY RT-PCR (RSV, FLU A&B, COVID)  [id number]     EKG     RADIOLOGY I independently reviewed and interpreted imaging and agree with radiologists findings.     PROCEDURES:  Critical Care performed:   Procedures   MEDICATIONS ORDERED IN ED: Medications  ketorolac  (TORADOL ) 15 MG/ML injection 15 mg (15 mg Intramuscular Given 09/28/24 0920)     IMPRESSION / MDM / ASSESSMENT AND PLAN / ED COURSE  I reviewed the triage vital signs and the nursing notes.   Differential diagnosis includes, but is not limited to, vertigo, hyperglycemia, electrolyte disarray, DKA, HHS, COVID, UTI, CVA.  Patient is awake and alert, hemodynamically stable and afebrile.  She has no focal neurological deficits.  Negative Dix-Hallpike bilaterally, she has no slurred speech, no word finding  difficulty, and symptoms have been ongoing for the past several months, doubt acute CVA.  Labs obtained are overall reassuring with exception of hyperglycemia which patient reports that she is already aware of.  Her urinalysis is suspicious for urinary tract infection as she has many leukocytes.  Will initiate antibiotics.  EKG did not reveal any acute ischemic signs or changes, also reviewed by attending MD.  CT head was  negative for any acute findings.  We discussed return precautions and importance of close outpatient follow-up.  Patient understands and agrees with plan.  Discharged in stable condition.    Patient's presentation is most consistent with acute presentation with potential threat to life or bodily function.    FINAL CLINICAL IMPRESSION(S) / ED DIAGNOSES   Final diagnoses:  Lower urinary tract infectious disease  Acute nonintractable headache, unspecified headache type     Rx / DC Orders   ED Discharge Orders          Ordered    cephALEXin  (KEFLEX ) 500 MG capsule  4 times daily        09/28/24 1006             Note:  This document was prepared using Dragon voice recognition software and may include unintentional dictation errors.   Jaymz Traywick E, PA-C 09/28/24 1407    Dicky Anes, MD 09/28/24 [phone]

## 2024-09-28 NOTE — Discharge Instructions (Signed)
Take the antibiotics as prescribed. Return for new, worsening, or change in symptoms or other concerns. It was a pleasure caring for you today.

## 2024-11-23 ENCOUNTER — Ambulatory Visit: Payer: Self-pay

## 2024-11-23 NOTE — Telephone Encounter (Signed)
 FYI Only or Action Required?: FYI only for provider: ED advised.  Patient was last seen in primary care on 01/27/2024 by Kelly Curtis LABOR, FNP.  Called Nurse Triage reporting Palpitations.  Symptoms began a week ago.  Interventions attempted: Rest, hydration, or home remedies.  Symptoms are: gradually worsening.  Triage Disposition: Go to ED Now (Notify PCP), Call PCP Now  Patient/caregiver understands and will follow disposition?: Yes  Copied from CRM [id number]. Topic: Clinical - Red Word Triage >> Nov 23, 2024 10:56 AM Emylou G wrote: [facility] that prompted transfer to Nurse Triage: Headaches and Chest Palpitations - recently sugar has been 300 and 400 the last few days. Reason for Disposition  [1] Blood glucose > 300 mg/dL (83.2 mmol/L) AND [7] two or more times in a row  Feeling weak or lightheaded (e.g., woozy, feeling like they might faint)  Answer Assessment - Initial Assessment Questions About a month ago- patient had headaches and got so bad she went to the ED- diagnosed with UTI- treated and headaches stopped  2 weeks ago- started having daily headaches in the morning. Tylneol, ibuprofen  with mild relief.  Palpitations at night before bed- feels elevated but not irregular- hx of SVT s/p ablation. Denies CP, SOB, fever. Had two episodes of balance issues this week lasting a few minutes. Had to hold onto the wall to not fall. Denies spinning, just could not maintain balance.   BGL between 300-400 for the last few days- typically 135-145. Gets elevated when sick. On insulin  managed by outside provider. (Advised to reach out to them). Increased thirst, denies increased hunger or urination.   Patient advised to be seen in the ED for palpitations and balance issues. Understands and will head there.    1. DESCRIPTION: Please describe your heart rate or heartbeat that you are having (e.g., fast/slow, regular/irregular, skipped or extra beats, palpitations)     Rate feels  faster-- at bedtime  2. ONSET: When did it start? (e.g., minutes, hours, days)      Last 2 weeks  3. DURATION: How long does it last (e.g., seconds, minutes, hours)     20-22minutes 4. PATTERN Does it come and go, or has it been constant since it started?  Does it get worse with exertion?   Are you feeling it now?     Only at bedtime  6. HEART RATE: Can you tell me your heart rate? How many beats in 15 seconds?  Note: Not all patients can do this.       unsure 7. RECURRENT SYMPTOM: Have you ever had this before? If Yes, ask: When was the last time? and What happened that time?      Hx of tachycardia s/p ablation 8. CAUSE: What do you think is causing the palpitations?     unsure 9. CARDIAC HISTORY: Do you have any history of heart disease? (e.g., heart attack, angina, bypass surgery, angioplasty, arrhythmia)      Hx of tachycardia s/p ablation  10. OTHER SYMPTOMS: Do you have any other symptoms? (e.g., dizziness, chest pain, sweating, difficulty breathing)       Headaches, blood sugar elevated  Protocols used: Heart Rate and Heartbeat Questions-A-AH, Diabetes - High Blood Sugar-A-AH

## 2024-11-29 ENCOUNTER — Emergency Department

## 2024-11-29 ENCOUNTER — Emergency Department: Admission: EM | Admit: 2024-11-29 | Discharge: 2024-11-29 | Disposition: A | Discharge status: Home / Self Care

## 2024-11-29 DIAGNOSIS — R002 Palpitations: Secondary | ICD-10-CM | POA: Insufficient documentation

## 2024-11-29 DIAGNOSIS — R079 Chest pain, unspecified: Secondary | ICD-10-CM | POA: Diagnosis present

## 2024-11-29 LAB — BASIC METABOLIC PANEL WITH GFR
Anion gap: 12 (ref 5–15)
BUN: 14 mg/dL (ref 6–20)
CO2: 24 mmol/L (ref 22–32)
Calcium: 10.3 mg/dL (ref 8.9–10.3)
Chloride: 101 mmol/L (ref 98–111)
Creatinine, Ser: 0.59 mg/dL (ref 0.44–1.00)
GFR, Estimated: >60 mL/min
Glucose, Bld: 245 mg/dL — ABNORMAL HIGH (ref 70–99)
Potassium: 3.9 mmol/L (ref 3.5–5.1)
Sodium: 137 mmol/L (ref 135–145)

## 2024-11-29 LAB — CBC
HCT: 39.5 % (ref 36.0–46.0)
Hemoglobin: 12.8 g/dL (ref 12.0–15.0)
MCH: 25.3 pg — ABNORMAL LOW (ref 26.0–34.0)
MCHC: 32.4 g/dL (ref 30.0–36.0)
MCV: 78.2 fL — ABNORMAL LOW (ref 80.0–100.0)
Platelets: 170 K/uL (ref 150–400)
RBC: 5.05 MIL/uL (ref 3.87–5.11)
RDW: 16.2 % — ABNORMAL HIGH (ref 11.5–15.5)
WBC: 9.2 K/uL (ref 4.0–10.5)
nRBC: 0 % (ref 0.0–0.2)

## 2024-11-29 LAB — TROPONIN T, HIGH SENSITIVITY
Troponin T High Sensitivity: <15 ng/L (ref 0–19)
Troponin T High Sensitivity: <15 ng/L (ref 0–19)

## 2024-11-29 MED ORDER — ACETAMINOPHEN 325 MG PO TABS
650.0000 mg | ORAL_TABLET | Freq: Once | ORAL | Status: AC
  Administered 2024-11-29: 650 mg via ORAL
  Filled 2024-11-29: qty 2

## 2024-11-29 NOTE — ED Provider Notes (Signed)
 "  [facility] Provider Note    Event Date/Time   First MD Initiated Contact with Patient 11/29/24 [phone]     (approximate)   History   Chest Pain   HPI  Kelly Matthews is a 59 y.o. female  with a past medical history of SVT status post ablation presenting to the ED for chest pain and palpitations that started last night. She reports they initially resolved but have returned this morning. She states the pain moves around her chest on both sides. Denies shortness of breath or leg swelling. States she had similar pain prior to her ablation.        Physical Exam   Triage Vital Signs: ED Triage Vitals  Encounter Vitals Group     BP 11/29/24 0714 (!) 166/90     Girls Systolic BP Percentile --      Girls Diastolic BP Percentile --      Boys Systolic BP Percentile --      Boys Diastolic BP Percentile --      Pulse Rate 11/29/24 0714 84     Resp 11/29/24 0714 18     Temp 11/29/24 0714 97.9 F (36.6 C)     Temp Source 11/29/24 0714 Oral     SpO2 11/29/24 0714 98 %     Weight 11/29/24 0711 200 lb (90.7 kg)     Height 11/29/24 0711 5' (1.524 m)     Head Circumference --      Peak Flow --      Pain Score 11/29/24 0711 9     Pain Loc --      Pain Education --      Exclude from Growth Chart --     Most recent vital signs: Vitals:   11/29/24 0714 11/29/24 0925  BP: (!) 166/90 (!) 161/74  Pulse: 84 78  Resp: 18 18  Temp: 97.9 F (36.6 C)   SpO2: 98% 100%     General: Awake, no distress.  CV:  Good peripheral perfusion.  Resp:  Normal effort. Clear to ausculation  Abd:  No distention.  Other:  No pitting edema   ED Results / Procedures / Treatments   Labs (all labs ordered are listed, but only abnormal results are displayed) Labs Reviewed  BASIC METABOLIC PANEL WITH GFR - Abnormal; Notable for the following components:      Result Value   Glucose, Bld 245 (*)    All other components within normal limits  CBC - Abnormal; Notable  for the following components:   MCV 78.2 (*)    MCH 25.3 (*)    RDW 16.2 (*)    All other components within normal limits  TROPONIN T, HIGH SENSITIVITY  TROPONIN T, HIGH SENSITIVITY     EKG  ED ECG REPORT I, Reche CHRISTELLA Leventhal, the attending physician, personally viewed and interpreted this ECG.  Date: 11/29/2024 Rate: 70 bpm Rhythm: normal sinus rhythm QRS Axis: normal Intervals: normal ST/T Wave abnormalities: normal Narrative Interpretation: no evidence of acute ischemia    RADIOLOGY I, Reche CHRISTELLA Leventhal, personally viewed and evaluated these images (plain radiographs) as part of my medical decision making, as well as reviewing the written report by the radiologist. No acute findings on imaging.     PROCEDURES:  Critical Care performed: No  Procedures   MEDICATIONS ORDERED IN ED: Medications  acetaminophen  (TYLENOL ) tablet 650 mg (650 mg Oral Given 11/29/24 0932)     IMPRESSION / MDM / ASSESSMENT AND  PLAN / ED COURSE  I reviewed the triage vital signs and the nursing notes.                              Differential diagnosis includes, but is not limited to, ACS, aortic dissection, pulmonary embolism, cardiac tamponade, pneumothorax, pneumonia, pericarditis, myocarditis, GI-related causes including esophagitis/gastritis, and musculoskeletal chest wall pain.     Patient's presentation is most consistent with acute illness / injury with system symptoms.  Patient is a 59 year old female with a past medical history of SVT status post ablation presenting to the ED for chest pain and palpitations. Patient's work up was overall unremarkable. Negative troponins. Normal CXR. Normal EKG. NO signs of infection. Elevated glucose but normal bicarb and anion gap. Discussed results with patient. Discussed plan for discharge with instructions to follow up with her PCP and cardiology this week. Tylenol /Motrin  as needed. Return to the ED for new or worsening symptoms.        FINAL CLINICAL IMPRESSION(S) / ED DIAGNOSES   Final diagnoses:  Nonspecific chest pain     Rx / DC Orders   ED Discharge Orders     None        Note:  This document was prepared using Dragon voice recognition software and may include unintentional dictation errors.   [place] Reche HERO, MD 11/29/24 1009  "

## 2024-11-29 NOTE — Progress Notes (Signed)
 Established Patient Visit   Chief Complaint: Chief Complaint  Patient presents with   Follow-up    ER Balance issues   Date of Service: 11/29/2024 Date of Birth: [birth year] PCP: [facility]  History of Present Illness: Ms. Kelly Matthews is a 59 y.o.female patient with a past medical history of SVT s/p ablation in 2021, hypertension, mild aortic valve stenosis (by echo 12/2014, not seen on any more recent echo's), hyperlipidemia.   History of Present Illness Kelly Matthews is a 59 year old female who presents with dizziness and headaches.  She has been experiencing dizziness and balance issues for the past two to three weeks, accompanied by headaches, particularly in the occipital region. These symptoms led to an ER visit about a month and a half ago, where a CT scan was performed.  During the ER visit, she was diagnosed with a urinary tract infection despite the absence of typical symptoms such as dysuria or urinary frequency. She was treated with Tylenol , which did not alleviate her headache pain.  She has a history of cardiac ablation for supraventricular tachycardia (SVT) in 2021, with no recurrence of SVT since the procedure. Her current medications include Jardiance, rosuvastatin , and valsartan .  She also reports experiencing chest pain and palpitations, with pain radiating to her arm and underarm. No history of migraines.    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 03/2024: Fib4 <1.   Migraine    Obesity    Psoriasis    RLS (restless legs syndrome) 04/2019   Supraventricular tachycardia (HHS-HCC)    s/p cardiac ablation 10/2020   Type 2 diabetes mellitus  (CMS/HHS-HCC)    Hospitalized for DKA on 12/05/16 at [facility] while on steroids    Past Surgical History She has a past surgical history that includes Cesarean section; Breast Biopsy; and C6-7 ACDF (02/01/2017).   Medications and Allergies  Current Medications  Current Outpatient Medications on File Prior to Visit  Medication Sig Dispense Refill   blood-glucose sensor (DEXCOM G7 SENSOR) Devi Use 1 each every 10 (ten) days 9 each 3   DULoxetine (CYMBALTA) 30 MG DR capsule Take 30 mg by mouth once daily     empagliflozin (JARDIANCE) 25 mg tablet Take 1 tablet (25 mg total) by mouth once daily 90 tablet 3   gabapentin  (NEURONTIN ) 100 MG capsule Take 100 mg by mouth at bedtime     insulin  ASPART (NOVOLOG  FLEXPEN) pen injector (concentration 100 units/mL) Inject up to 100 units daily in divided doses as directed 30 mL 5   metFORMIN  (GLUCOPHAGE ) 1000 MG tablet Take 1 tablet (1,000 mg total) by mouth 2 (two) times daily with meals 180 tablet 3   prazosin (MINIPRESS) 1 MG capsule Take 1 mg by mouth at bedtime     semaglutide (RYBELSUS) 14 mg tablet Take 1 tablet (14 mg total) by mouth once daily Do not cut, crush, or chew 90 tablet 1   busPIRone  (BUSPAR ) 15 MG tablet Take 15 mg by mouth 2 (two) times daily     busPIRone  (BUSPAR ) 7.5 MG tablet Take 7.5 mg by mouth 2 (two) times daily (Patient not taking: Reported on 08/28/2024)     celecoxib (  CELEBREX) 100 MG capsule Take 1 capsule (100 mg total) by mouth 2 (two) times daily 60 capsule 5   doxycycline  (VIBRA -TABS) 100 MG tablet Take 100 mg by mouth 2 (two) times daily (Patient not taking: Reported on 08/28/2024)     FLUoxetine  (PROZAC ) 10 MG capsule Take 10 mg by mouth once daily     fluticasone  propionate (FLONASE ) 50 mcg/actuation nasal spray Place 2 sprays into both nostrils once daily     folic acid  (FOLVITE ) 1 MG tablet Take 1 tablet (1 mg total) by mouth once daily 90 tablet 3   insulin  DEGLUDEC (TRESIBA FLEXTOUCH U-200) pen  injector (concentration 200 units/mL) Inject 70 Units subcutaneously at bedtime 45 mL 3   ipratropium (ATROVENT ) 0.02 % nebulizer solution Take 500 mcg by nebulization 2 (two) times daily     OTEZLA STARTER 10 mg (4)-20 mg (4)-30 mg (47) DsPk Take as directed per dispensing package - Starter Pack (Patient not taking: Reported on 08/28/2024)     rosuvastatin  (CRESTOR ) 20 MG tablet Take 1 tablet (20 mg total) by mouth once daily 90 tablet 3   valsartan  (DIOVAN ) 160 MG tablet Take 160 mg by mouth once daily (Patient not taking: Reported on 08/28/2024)     No current facility-administered medications on file prior to visit.    Allergies: Penicillins and Oxycodone  Social and Family History  Social History  reports that she quit smoking about 37 years ago. Her smoking use included cigarettes. She has been exposed to tobacco smoke. She has never used smokeless tobacco. She reports that she does not currently use alcohol. She reports that she does not use drugs.  Family History Family History  Problem Relation Name Age of Onset   Coronary Artery Disease (Blocked arteries around heart) Mother Cathlean Pert    Stroke Mother Cathlean Pert    Heart disease Mother Cathlean Pert    High blood pressure (Hypertension) Mother Cathlean Pert    Coronary Artery Disease (Blocked arteries around heart) Father Theotis Schicchi    Diabetes Father Theotis Schicchi    Emphysema Father Theotis Schicchi    Cancer Maternal Grandmother     Seizures Daughter     No Known Problems Sister     Diverticulitis Brother     No Known Problems Maternal Grandfather     No Known Problems Paternal Grandmother     No Known Problems Paternal Grandfather     No Known Problems Brother     No Known Problems Son [employer]    Pseudochol deficiency Neg Hx     Malignant hyperthermia Neg Hx     Anesthesia problems Neg Hx      Review of Systems   Review of Systems  Positive for chest pain  Negative for  weight gain weight loss, weakness, vision change, hearing loss, cough, congestion, PND, orthopnea, heartburn, nausea, diaphoresis, vomiting, diarrhea, bloody stool, melena, stomach pain, extremity pain, leg weakness, leg cramping, leg blood clots, headache, blackouts, nosebleed, trouble swallowing, mouth pain, urinary frequency, urination at night, muscle weakness, skin lesions, skin rashes, tingling ,ulcers, numbness, anxiety, and/or depression Physical Examination   Vitals:BP 134/86   Pulse 82   Ht 152.4 cm (5')   Wt 93.4 kg (206 lb)   LMP 03/02/2017 (Approximate)   SpO2 98%   BMI 40.23 kg/m  Ht:152.4 cm (5') Wt:93.4 kg (206 lb) ADJ:Anib surface area is 1.99 meters squared. Body mass index is 40.23 kg/m. Appearance: well appearing in no acute distress HEENT: Pupils equally reactive to light  and accomodation, no xanthalasma   Neck: Supple, no apparent thyromegaly, masses, or lymphadenopathy  Lungs: normal respiratory effort; no crackles, no rhonchi, no wheezes Heart: Regular rate and rhythm. Normal S1 S2  No gallops, murmur, no rub, PMI is normal size and placement. carotid upstroke normal without bruit. Jugular venous pressure is normal Abdomen: soft, nontender, not distended with normal bowel sounds. No apparent hepatosplenomegally. Abdominal aorta is normal size without bruit Extremities: no edema, no ulcers, no clubbing, no cyanosis Peripheral Pulses: 2+ in upper extremities, 2+ femoral pulses bilaterally, 2+lower extremity  Musculoskeletal;  Normal muscle tone without kyphosis Neurological:   Oriented and Alert, Cranial nerves intact  Assessment   59 y.o. female with  Encounter Diagnoses  Name Primary?   Mixed hyperlipidemia Yes   Benign essential hypertension    Mild aortic valve stenosis    Supraventricular tachycardia (HHS-HCC)          Plan     Orders Placed This Encounter  Procedures   CARD US  carotid bilateral   Echo complete   Assessment &  Plan Mild aortic valve stenosis Previous imaging indicated mild aortic valve stenosis. Symptoms of dizziness and headaches may be related to this condition. Further evaluation is necessary to assess the current status of the aortic valve. - Ordered echocardiogram to assess aortic valve status  Supraventricular tachycardia, post-ablation Supraventricular tachycardia treated with cardiac ablation in 2021. No recurrence of SVT as heart rate and EKG are normal.  Benign essential hypertension Blood pressure is well-controlled on current medication regimen. - Continue current antihypertensive medications   Return in about 4 weeks (around 12/27/2024).   Attestation Statement:   I personally performed the service, non-incident to. (WP)   SABINA CUSTOVIC, DO

## 2024-11-29 NOTE — ED Triage Notes (Signed)
 Pt presents to the ED via POV from home. Pt reports CP and palpitations that started last night, went away and started back this morning. Pt reports it alternates between her left and right chest. Pt A&Ox4. Ambulatory.

## 2024-11-30 ENCOUNTER — Other Ambulatory Visit: Payer: Self-pay

## 2024-11-30 ENCOUNTER — Emergency Department: Admission: EM | Admit: 2024-11-30 | Discharge: 2024-11-30 | Disposition: A | Discharge status: Home / Self Care

## 2024-11-30 ENCOUNTER — Emergency Department

## 2024-11-30 DIAGNOSIS — R0789 Other chest pain: Secondary | ICD-10-CM | POA: Diagnosis present

## 2024-11-30 DIAGNOSIS — R079 Chest pain, unspecified: Secondary | ICD-10-CM

## 2024-11-30 LAB — TROPONIN T, HIGH SENSITIVITY: Troponin T High Sensitivity: <15 ng/L (ref 0–19)

## 2024-11-30 LAB — CBC
HCT: 40.6 % (ref 36.0–46.0)
Hemoglobin: 13.2 g/dL (ref 12.0–15.0)
MCH: 25.1 pg — ABNORMAL LOW (ref 26.0–34.0)
MCHC: 32.5 g/dL (ref 30.0–36.0)
MCV: 77.3 fL — ABNORMAL LOW (ref 80.0–100.0)
Platelets: 222 K/uL (ref 150–400)
RBC: 5.25 MIL/uL — ABNORMAL HIGH (ref 3.87–5.11)
RDW: 16.1 % — ABNORMAL HIGH (ref 11.5–15.5)
WBC: 10.8 K/uL — ABNORMAL HIGH (ref 4.0–10.5)
nRBC: 0 % (ref 0.0–0.2)

## 2024-11-30 LAB — BASIC METABOLIC PANEL WITH GFR
Anion gap: 13 (ref 5–15)
BUN: 13 mg/dL (ref 6–20)
CO2: 24 mmol/L (ref 22–32)
Calcium: 10.9 mg/dL — ABNORMAL HIGH (ref 8.9–10.3)
Chloride: 100 mmol/L (ref 98–111)
Creatinine, Ser: 0.61 mg/dL (ref 0.44–1.00)
GFR, Estimated: >60 mL/min
Glucose, Bld: 203 mg/dL — ABNORMAL HIGH (ref 70–99)
Potassium: 3.6 mmol/L (ref 3.5–5.1)
Sodium: 137 mmol/L (ref 135–145)

## 2024-11-30 NOTE — Discharge Instructions (Signed)
 You were seen today due to concern of chest pain.  At this time fortunately your blood work is reassuring.  I would recommend following up with your cardiologist in the outpatient setting as you have planned.  If you have any worsening of symptoms such as increased chest pain, shortness of breath, lightheadedness, or any other symptoms you find concerning please return to the emergency department immediately for further medical management.

## 2024-11-30 NOTE — ED Provider Notes (Signed)
 "  [facility] Provider Note    Event Date/Time   First MD Initiated Contact with Patient 11/30/24 1709     (approximate)   History   Chest Pain   HPI  Kelly Matthews is a 59 y.o. female presents with concern of chest pain.  Was just seen here yesterday for similar, it seems sudden onset of severe sharp chest pain which will sometimes radiate to her arm sometimes down her legs, sometimes in her belly.  It comes and goes sporadically, sometimes will have affiliated shortness of breath but not always.  Was worked up yesterday, was able to follow-up with her cardiologist yesterday, cardiologist with plan to have echocardiogram done.  It seems per the note it was actually in regards to a follow-up from a visit that the patient had to emergency department of couple of weeks ago, the patient tells me that the cardiologist informed her that they did not do a thorough cardiac evaluation yesterday but it seems that there may be some confusion as the note mentions her visit from a few weeks ago.  Regardless patient does continue to complain of some discomfort along her chest described as a dull pain now.  She did take a full aspirin prior to arrival.  Has not had any leg swelling no prior history of PEs or DVTs, has not been having any hemoptysis and denies any cancer history.  Does not have any smoking history.  She does inform me that she has a history of a aortic valve issue and also an SVT with ablation in the past.     Physical Exam   Triage Vital Signs: ED Triage Vitals [11/30/24 1455]  Encounter Vitals Group     BP (!) 171/77     Girls Systolic BP Percentile      Girls Diastolic BP Percentile      Boys Systolic BP Percentile      Boys Diastolic BP Percentile      Pulse Rate 84     Resp 18     Temp 98.4 F (36.9 C)     Temp src      SpO2 98 %     Weight 198 lb 6.6 oz (90 kg)     Height 5' (1.524 m)     Head Circumference      Peak Flow      Pain  Score 9     Pain Loc      Pain Education      Exclude from Growth Chart     Most recent vital signs: Vitals:   11/30/24 1455  BP: (!) 171/77  Pulse: 84  Resp: 18  Temp: 98.4 F (36.9 C)  SpO2: 98%     General: Awake, no distress.  CV:  Good peripheral perfusion.  Reproducible chest wall tenderness on exam Resp:  Normal effort.  Clear to auscultation bilaterally Abd:  No distention.  Soft nontender Other:     ED Results / Procedures / Treatments   Labs (all labs ordered are listed, but only abnormal results are displayed) Labs Reviewed  BASIC METABOLIC PANEL WITH GFR - Abnormal; Notable for the following components:      Result Value   Glucose, Bld 203 (*)    Calcium  10.9 (*)    All other components within normal limits  CBC - Abnormal; Notable for the following components:   WBC 10.8 (*)    RBC 5.25 (*)    MCV 77.3 (*)  MCH 25.1 (*)    RDW 16.1 (*)    All other components within normal limits  TROPONIN T, HIGH SENSITIVITY  TROPONIN T, HIGH SENSITIVITY     EKG  On my independent interpretation of this EKG appears to be a sinus rhythm with rate of about 80, axis of about 30, intervals appear to be within normal limits, no obvious ischemia appreciated on this EKG   RADIOLOGY On my independent interpretation of this chest x-ray, no acute cardiopulmonary findings  PROCEDURES:  Critical Care performed: No  Procedures   MEDICATIONS ORDERED IN ED: Medications - No data to display   IMPRESSION / MDM / ASSESSMENT AND PLAN / ED COURSE  I reviewed the triage vital signs and the nursing notes.                               Patient's presentation is most consistent with acute complicated illness / injury requiring diagnostic workup.  59 year old female presenting with concern of chest pain.  She does have a history of a prior valve stenosis, she does not appear to be in any acute distress.  Severity of the pain has improved since the initial onset.  She  is hypertensive here but remaining vitals appear reassuring.  Troponin negative EKG without acute ischemia.  She did just see cardiology yesterday for the same complaint.  She has planned with follow-up at outpatient.  We will have her discharged home at this time, I feel unlikely PE I feel unlikely aortic pathology, feel the outpatient echo is reasonable, possibly gastric versus musculoskeletal source but not entirely clear at this time.  Discussed return precautions, patient to be discharged home at this time.       FINAL CLINICAL IMPRESSION(S) / ED DIAGNOSES   Final diagnoses:  Chest pain, unspecified type     Rx / DC Orders   ED Discharge Orders          Ordered    Ambulatory referral to Cardiology       Comments: If you have not heard from the Cardiology office within the next 72 hours please call [phone].   11/30/24 1734             Note:  This document was prepared using Dragon voice recognition software and may include unintentional dictation errors.   Fernand Rossie HERO, MD 11/30/24 2047  "

## 2024-11-30 NOTE — ED Triage Notes (Signed)
 Pt to ED for chest pain for a few days. Reports was seen yesterday for same and told does not know what is wrong. States went to cardiologist and was told the ER did not thoroughly check out her heart, was set up for outpatient test. Started having worsening chest pain today and was told to come back to ER. +shob.  Pt in NAD. RR even and unlabored.

## 2024-11-30 NOTE — ED Triage Notes (Signed)
 First Nurse Note:  Pt via [facility] from home. Pt c/o CP intermittent for the past 2 days, also reports weakness. EMS gave pt 324 ASA. Denies any SOB. Pt has a hx of ablation 2-3 years ago for SVT. Pt is A&OX4 and NAD  EMS reports:  98.6 orally  150/98 BP  98% on RA  92 HR
